# Patient Record
Sex: Female | Born: 1975 | Race: Black or African American | Hispanic: No | Smoking: Former smoker
Health system: Southern US, Community
[De-identification: ages and names within clinical notes are randomized; demographics above are authoritative.]

## PROBLEM LIST (undated history)

## (undated) DIAGNOSIS — R29898 Other symptoms and signs involving the musculoskeletal system: Secondary | ICD-10-CM

## (undated) DIAGNOSIS — J45909 Unspecified asthma, uncomplicated: Secondary | ICD-10-CM

## (undated) DIAGNOSIS — Z720 Tobacco use: Secondary | ICD-10-CM

## (undated) DIAGNOSIS — R112 Nausea with vomiting, unspecified: Secondary | ICD-10-CM

## (undated) DIAGNOSIS — F32A Depression, unspecified: Secondary | ICD-10-CM

## (undated) DIAGNOSIS — Z87442 Personal history of urinary calculi: Secondary | ICD-10-CM

## (undated) DIAGNOSIS — R7303 Prediabetes: Secondary | ICD-10-CM

## (undated) DIAGNOSIS — M5126 Other intervertebral disc displacement, lumbar region: Secondary | ICD-10-CM

## (undated) DIAGNOSIS — D649 Anemia, unspecified: Secondary | ICD-10-CM

## (undated) DIAGNOSIS — M1990 Unspecified osteoarthritis, unspecified site: Secondary | ICD-10-CM

## (undated) DIAGNOSIS — E669 Obesity, unspecified: Secondary | ICD-10-CM

## (undated) DIAGNOSIS — F329 Major depressive disorder, single episode, unspecified: Secondary | ICD-10-CM

## (undated) DIAGNOSIS — R7301 Impaired fasting glucose: Secondary | ICD-10-CM

## (undated) DIAGNOSIS — M5416 Radiculopathy, lumbar region: Secondary | ICD-10-CM

## (undated) DIAGNOSIS — Z9889 Other specified postprocedural states: Secondary | ICD-10-CM

## (undated) DIAGNOSIS — M47816 Spondylosis without myelopathy or radiculopathy, lumbar region: Secondary | ICD-10-CM

## (undated) DIAGNOSIS — F419 Anxiety disorder, unspecified: Secondary | ICD-10-CM

## (undated) HISTORY — DX: Tobacco use: Z72.0

## (undated) HISTORY — PX: INTRAUTERINE DEVICE (IUD) INSERTION: SHX5877

## (undated) HISTORY — DX: Anxiety disorder, unspecified: F41.9

## (undated) HISTORY — PX: ABDOMINAL HYSTERECTOMY: SHX81

## (undated) HISTORY — DX: Depression, unspecified: F32.A

## (undated) HISTORY — PX: BARIATRIC SURGERY: SHX1103

## (undated) HISTORY — PX: TOTAL ABDOMINAL HYSTERECTOMY: SHX209

## (undated) HISTORY — DX: Obesity, unspecified: E66.9

## (undated) HISTORY — PX: KNEE ARTHROSCOPY: SUR90

## (undated) HISTORY — PX: TUBAL LIGATION: SHX77

## (undated) HISTORY — DX: Impaired fasting glucose: R73.01

## (undated) HISTORY — DX: Major depressive disorder, single episode, unspecified: F32.9

---

## 2010-10-27 ENCOUNTER — Emergency Department: Payer: Self-pay | Admitting: Emergency Medicine

## 2010-12-25 ENCOUNTER — Emergency Department: Payer: Self-pay | Admitting: Emergency Medicine

## 2011-01-10 ENCOUNTER — Emergency Department: Payer: Self-pay | Admitting: Emergency Medicine

## 2011-03-20 LAB — HM PAP SMEAR

## 2011-04-13 ENCOUNTER — Emergency Department: Payer: Self-pay | Admitting: Emergency Medicine

## 2011-07-10 ENCOUNTER — Emergency Department: Payer: Self-pay | Admitting: *Deleted

## 2011-11-09 ENCOUNTER — Ambulatory Visit: Payer: Self-pay

## 2012-04-09 ENCOUNTER — Emergency Department: Payer: Self-pay | Admitting: *Deleted

## 2012-05-21 ENCOUNTER — Emergency Department: Payer: Self-pay | Admitting: Emergency Medicine

## 2012-05-21 LAB — URINALYSIS, COMPLETE
Bilirubin,UR: NEGATIVE
Leukocyte Esterase: NEGATIVE
Ph: 5 (ref 4.5–8.0)
Protein: NEGATIVE
RBC,UR: 1 /HPF (ref 0–5)
WBC UR: 1 /HPF (ref 0–5)

## 2012-11-01 ENCOUNTER — Emergency Department: Payer: Self-pay | Admitting: Emergency Medicine

## 2012-11-01 LAB — COMPREHENSIVE METABOLIC PANEL
Alkaline Phosphatase: 66 U/L (ref 50–136)
BUN: 7 mg/dL (ref 7–18)
Co2: 27 mmol/L (ref 21–32)
Creatinine: 0.9 mg/dL (ref 0.60–1.30)
EGFR (African American): >60
EGFR (Non-African Amer.): >60
Potassium: 3.8 mmol/L (ref 3.5–5.1)
SGOT(AST): 14 U/L — ABNORMAL LOW (ref 15–37)
Sodium: 139 mmol/L (ref 136–145)
Total Protein: 7.7 g/dL (ref 6.4–8.2)

## 2012-11-01 LAB — URINALYSIS, COMPLETE
Glucose,UR: NEGATIVE mg/dL (ref 0–75)
Ketone: NEGATIVE
Leukocyte Esterase: NEGATIVE
Nitrite: NEGATIVE
Ph: 5 (ref 4.5–8.0)
RBC,UR: 2 /HPF (ref 0–5)

## 2012-11-01 LAB — CBC
HCT: 42.1 % (ref 35.0–47.0)
Platelet: 310 10*3/uL (ref 150–440)
RBC: 4.3 10*6/uL (ref 3.80–5.20)
RDW: 13.8 % (ref 11.5–14.5)
WBC: 8.1 10*3/uL (ref 3.6–11.0)

## 2012-11-01 LAB — PREGNANCY, URINE: Pregnancy Test, Urine: NEGATIVE m[IU]/mL

## 2012-11-01 LAB — RAPID INFLUENZA A&B ANTIGENS

## 2013-05-05 ENCOUNTER — Emergency Department: Payer: Self-pay | Admitting: Emergency Medicine

## 2013-05-05 LAB — CBC
HGB: 13.1 g/dL (ref 12.0–16.0)
WBC: 14.5 10*3/uL — ABNORMAL HIGH (ref 3.6–11.0)

## 2013-05-05 LAB — BASIC METABOLIC PANEL
Anion Gap: 9 (ref 7–16)
Chloride: 107 mmol/L (ref 98–107)
Co2: 23 mmol/L (ref 21–32)
Creatinine: 1.14 mg/dL (ref 0.60–1.30)
EGFR (Non-African Amer.): >60
Glucose: 91 mg/dL (ref 65–99)
Potassium: 3.6 mmol/L (ref 3.5–5.1)
Sodium: 139 mmol/L (ref 136–145)

## 2013-06-06 ENCOUNTER — Encounter (HOSPITAL_COMMUNITY): Payer: Self-pay | Admitting: Emergency Medicine

## 2013-06-06 ENCOUNTER — Emergency Department (HOSPITAL_COMMUNITY)
Admission: EM | Admit: 2013-06-06 | Discharge: 2013-06-06 | Disposition: A | Discharge status: Home / Self Care | Payer: BC Managed Care – PPO | Attending: Emergency Medicine | Admitting: Emergency Medicine

## 2013-06-06 DIAGNOSIS — Z79899 Other long term (current) drug therapy: Secondary | ICD-10-CM | POA: Insufficient documentation

## 2013-06-06 DIAGNOSIS — Z3202 Encounter for pregnancy test, result negative: Secondary | ICD-10-CM | POA: Insufficient documentation

## 2013-06-06 DIAGNOSIS — N939 Abnormal uterine and vaginal bleeding, unspecified: Secondary | ICD-10-CM

## 2013-06-06 DIAGNOSIS — F17200 Nicotine dependence, unspecified, uncomplicated: Secondary | ICD-10-CM | POA: Insufficient documentation

## 2013-06-06 DIAGNOSIS — N898 Other specified noninflammatory disorders of vagina: Secondary | ICD-10-CM | POA: Insufficient documentation

## 2013-06-06 LAB — URINALYSIS, ROUTINE W REFLEX MICROSCOPIC
Ketones, ur: 15 mg/dL — AB
Nitrite: NEGATIVE
Specific Gravity, Urine: 1.03 (ref 1.005–1.030)
pH: 5.5 (ref 5.0–8.0)

## 2013-06-06 LAB — CBC
HCT: 37.3 % (ref 36.0–46.0)
MCH: 32.9 pg (ref 26.0–34.0)
MCV: 94.4 fL (ref 78.0–100.0)
Platelets: 337 10*3/uL (ref 150–400)
RBC: 3.95 MIL/uL (ref 3.87–5.11)
RDW: 14.4 % (ref 11.5–15.5)

## 2013-06-06 LAB — BASIC METABOLIC PANEL
BUN: 8 mg/dL (ref 6–23)
CO2: 24 mEq/L (ref 19–32)
Chloride: 103 mEq/L (ref 96–112)
GFR calc non Af Amer: 71 mL/min — ABNORMAL LOW (ref >90)
Glucose, Bld: 93 mg/dL (ref 70–99)
Potassium: 3.1 mEq/L — ABNORMAL LOW (ref 3.5–5.1)
Sodium: 137 mEq/L (ref 135–145)

## 2013-06-06 LAB — URINE MICROSCOPIC-ADD ON

## 2013-06-06 MED ORDER — KETOROLAC TROMETHAMINE 30 MG/ML IJ SOLN
30.0000 mg | Freq: Once | INTRAMUSCULAR | Status: AC
  Administered 2013-06-06: 30 mg via INTRAVENOUS
  Filled 2013-06-06: qty 1

## 2013-06-06 MED ORDER — SODIUM CHLORIDE 0.9 % IV BOLUS (SEPSIS)
1000.0000 mL | Freq: Once | INTRAVENOUS | Status: AC
  Administered 2013-06-06: 1000 mL via INTRAVENOUS

## 2013-06-06 MED ORDER — POTASSIUM CHLORIDE CRYS ER 20 MEQ PO TBCR
40.0000 meq | EXTENDED_RELEASE_TABLET | Freq: Once | ORAL | Status: AC
  Administered 2013-06-06: 40 meq via ORAL
  Filled 2013-06-06: qty 2

## 2013-06-06 NOTE — ED Notes (Signed)
Patient presents to ED today with complaints of vaginal bleeding and cramping, pain in her lower back  that started today, and patient reports her IUD came out, patient has IUD with her in cup. Patient continues to bleed at this time, patient reports 2 pads per hour.

## 2013-06-06 NOTE — ED Provider Notes (Signed)
CSN: [id number]     Arrival date & time 06/06/13  1531 History     First MD Initiated Contact with Patient 06/06/13 1628     Chief Complaint  Patient presents with  . Vaginal Bleeding   (Consider location/radiation/quality/duration/timing/severity/associated sxs/prior Treatment) The history is provided by the patient.  Haley Garza is a 37 y.o. female your vaginal bleeding. She had a Maranda daily place by her OB in February. Today she has a lot of vaginal bleeding and cramping and she noticed that her Hinda Kehr came out. She then had more vaginal bleeding afterwards. Denies any lightheaded and dizziness. Denies being pregnant.    History reviewed. No pertinent past medical history. Past Surgical History  Procedure Laterality Date  . Intrauterine device (iud) insertion      feb 2014  . Cesarean section      x 2    History reviewed. No pertinent family history. History  Substance Use Topics  . Smoking status: Current Every Day Smoker    Types: Cigarettes  . Smokeless tobacco: Not on file  . Alcohol Use: Not on file   OB History   Grav Para Term Preterm Abortions TAB SAB Ect Mult Living                 Review of Systems  Genitourinary: Positive for vaginal bleeding.  All other systems reviewed and are negative.    Allergies  Review of patient's allergies indicates no known allergies.  Home Medications   Current Outpatient Rx  Name  Route  Sig  Dispense  Refill  . buPROPion (WELLBUTRIN SR) 150 MG 12 hr tablet   Oral   Take 150 mg by mouth 2 (two) times daily.         Marland Kitchen ibuprofen (ADVIL,MOTRIN) 200 MG tablet   Oral   Take 800 mg by mouth daily as needed for pain.         . Multiple Vitamins-Minerals (MULTIVITAMIN PO)   Oral   Take 1 tablet by mouth daily.         . Vitamin D, Ergocalciferol, (DRISDOL) 50000 UNITS CAPS capsule   Oral   Take 50,000 Units by mouth 2 (two) times a week.          BP 116/74  Pulse 52  Temp(Src) 98.3 F (36.8 C)  (Oral)  Resp 20  SpO2 100% Physical Exam  Nursing note and vitals reviewed. Constitutional: She is oriented to person, place, and time. She appears well-developed and well-nourished.  Slightly anxious   HENT:  Head: Normocephalic.  Mouth/Throat: Oropharynx is clear and moist.  Eyes: Conjunctivae are normal. Pupils are equal, round, and reactive to light.  Neck: Normal range of motion. Neck supple.  Cardiovascular: Normal rate, regular rhythm and normal heart sounds.   Pulmonary/Chest: Effort normal and breath sounds normal. No respiratory distress. She has no wheezes. She has no rales.  Abdominal: Soft. Bowel sounds are normal.  Obese, nontender and nondistended   Genitourinary:  Minimal blood at os, small amount of blood. Os closed, no CMT, no adnexal tenderness.   Musculoskeletal: Normal range of motion. She exhibits no edema and no tenderness.  Neurological: She is alert and oriented to person, place, and time.  Skin: Skin is warm and dry.  Psychiatric: She has a normal mood and affect. Her behavior is normal. Judgment and thought content normal.    ED Course   Procedures (including critical care time)  Labs Reviewed  URINALYSIS, ROUTINE W REFLEX MICROSCOPIC -  Abnormal; Notable for the following:    Color, Urine RED (*)    APPearance CLOUDY (*)    Hgb urine dipstick LARGE (*)    Bilirubin Urine SMALL (*)    Ketones, ur 15 (*)    Protein, ur 30 (*)    Leukocytes, UA SMALL (*)    All other components within normal limits  BASIC METABOLIC PANEL - Abnormal; Notable for the following:    Potassium 3.1 (*)    GFR calc non Af Amer 71 (*)    GFR calc Af Amer 83 (*)    All other components within normal limits  URINE MICROSCOPIC-ADD ON - Abnormal; Notable for the following:    Squamous Epithelial / LPF FEW (*)    All other components within normal limits  CBC  PREGNANCY, URINE   No results found. No diagnosis found.  MDM  Haley Garza is a 37 y.o. female here with  vaginal bleeding. I was able to visualize the miranda and it appeared intact. Her cbc is nl, K 3.1, supplemented. UCG neg, UA contaminated. I told her that its not uncommon to have miranda come out with vaginal bleeding and that she is expected to have more bleeding in the next few days. She will f/u with her OB regarding other options for vaginal cramps and bleeding.    Richardean Canal, MD 06/06/13 [phone]

## 2013-08-05 ENCOUNTER — Ambulatory Visit: Payer: Self-pay | Admitting: Orthopedic Surgery

## 2013-09-18 ENCOUNTER — Ambulatory Visit: Payer: Self-pay | Admitting: Unknown Physician Specialty

## 2014-01-14 ENCOUNTER — Emergency Department: Payer: Self-pay | Admitting: Emergency Medicine

## 2014-01-14 LAB — URINALYSIS, COMPLETE
Bacteria: NONE SEEN
Bilirubin,UR: NEGATIVE
GLUCOSE, UR: NEGATIVE mg/dL (ref 0–75)
KETONE: NEGATIVE
LEUKOCYTE ESTERASE: NEGATIVE
NITRITE: NEGATIVE
Ph: 5 (ref 4.5–8.0)
Protein: NEGATIVE
RBC,UR: 2 /HPF (ref 0–5)
SPECIFIC GRAVITY: 1.02 (ref 1.003–1.030)
Squamous Epithelial: <1

## 2014-01-14 LAB — CBC WITH DIFFERENTIAL/PLATELET
BASOS ABS: 0.1 10*3/uL (ref 0.0–0.1)
Basophil %: 1.1 %
EOS PCT: 4.7 %
Eosinophil #: 0.5 10*3/uL (ref 0.0–0.7)
HCT: 41.5 % (ref 35.0–47.0)
HGB: 13.6 g/dL (ref 12.0–16.0)
LYMPHS ABS: 3.1 10*3/uL (ref 1.0–3.6)
Lymphocyte %: 28.8 %
MCH: 31.7 pg (ref 26.0–34.0)
MCHC: 32.8 g/dL (ref 32.0–36.0)
MCV: 97 fL (ref 80–100)
Monocyte #: 0.7 x10 3/mm (ref 0.2–0.9)
Monocyte %: 6.3 %
NEUTROS ABS: 6.3 10*3/uL (ref 1.4–6.5)
Neutrophil %: 59.1 %
PLATELETS: 278 10*3/uL (ref 150–440)
RBC: 4.29 10*6/uL (ref 3.80–5.20)
RDW: 14.9 % — ABNORMAL HIGH (ref 11.5–14.5)
WBC: 10.7 10*3/uL (ref 3.6–11.0)

## 2014-01-14 LAB — COMPREHENSIVE METABOLIC PANEL
ALBUMIN: 3.4 g/dL (ref 3.4–5.0)
ALK PHOS: 51 U/L
ALT: 20 U/L (ref 12–78)
ANION GAP: 4 — AB (ref 7–16)
AST: 16 U/L (ref 15–37)
BUN: 10 mg/dL (ref 7–18)
Bilirubin,Total: 0.4 mg/dL (ref 0.2–1.0)
CO2: 27 mmol/L (ref 21–32)
CREATININE: 0.9 mg/dL (ref 0.60–1.30)
Calcium, Total: 8.7 mg/dL (ref 8.5–10.1)
Chloride: 107 mmol/L (ref 98–107)
EGFR (African American): >60
GLUCOSE: 127 mg/dL — AB (ref 65–99)
OSMOLALITY: 276 (ref 275–301)
POTASSIUM: 3.5 mmol/L (ref 3.5–5.1)
Sodium: 138 mmol/L (ref 136–145)
TOTAL PROTEIN: 7.4 g/dL (ref 6.4–8.2)

## 2014-01-14 LAB — TROPONIN I: Troponin-I: <0.02 ng/mL

## 2014-01-14 LAB — LIPASE, BLOOD: LIPASE: 161 U/L (ref 73–393)

## 2014-01-15 LAB — TSH: THYROID STIMULATING HORM: 1.29 u[IU]/mL

## 2015-01-18 ENCOUNTER — Ambulatory Visit: Payer: Self-pay | Admitting: Specialist

## 2015-01-26 DIAGNOSIS — R0789 Other chest pain: Secondary | ICD-10-CM | POA: Insufficient documentation

## 2015-01-28 ENCOUNTER — Ambulatory Visit: Admit: 2015-01-28 | Disposition: A | Discharge status: Home / Self Care | Payer: Self-pay | Admitting: Specialist

## 2015-04-08 ENCOUNTER — Other Ambulatory Visit: Payer: Self-pay | Admitting: Unknown Physician Specialty

## 2015-04-11 NOTE — Telephone Encounter (Signed)
Needs seen for further refills

## 2015-04-30 ENCOUNTER — Emergency Department: Payer: BC Managed Care – PPO

## 2015-04-30 ENCOUNTER — Encounter: Payer: Self-pay | Admitting: Emergency Medicine

## 2015-04-30 ENCOUNTER — Emergency Department
Admission: EM | Admit: 2015-04-30 | Discharge: 2015-04-30 | Disposition: A | Discharge status: Home / Self Care | Payer: BC Managed Care – PPO | Attending: Emergency Medicine | Admitting: Emergency Medicine

## 2015-04-30 DIAGNOSIS — Z79899 Other long term (current) drug therapy: Secondary | ICD-10-CM | POA: Diagnosis not present

## 2015-04-30 DIAGNOSIS — M7731 Calcaneal spur, right foot: Secondary | ICD-10-CM | POA: Diagnosis not present

## 2015-04-30 DIAGNOSIS — M79671 Pain in right foot: Secondary | ICD-10-CM | POA: Diagnosis present

## 2015-04-30 DIAGNOSIS — Z87891 Personal history of nicotine dependence: Secondary | ICD-10-CM | POA: Insufficient documentation

## 2015-04-30 MED ORDER — NAPROXEN 500 MG PO TABS: 500.0000 mg | ORAL_TABLET | Freq: Two times a day (BID) | ORAL | Status: DC

## 2015-04-30 MED ORDER — TRAMADOL HCL 50 MG PO TABS: 50.0000 mg | ORAL_TABLET | Freq: Four times a day (QID) | ORAL | Status: DC | PRN

## 2015-04-30 NOTE — Discharge Instructions (Signed)
Advise OTC donut heel support pending Podiatry evaluation. Heel Spur A heel spur is a hook of bone that can form on the calcaneus (the heel bone and the largest bone of the foot). Heel spurs are often associated with plantar fasciitis and usually come in people who have had the problem for an extended period of time. The cause of the relationship is unknown. The pain associated with them is thought to be caused by an inflammation (soreness and redness) of the plantar fascia rather than the spur itself. The plantar fascia is a thick fibrous like tissue that runs from the calcaneus (heel bone) to the ball of the foot. This strong, tight tissue helps maintain the arch of your foot. It helps distribute the weight across your foot as you walk or run. Stresses placed on the plantar fascia can be tremendous. When it is inflamed normal activities become painful. Pain is worse in the morning after sleeping. After sleeping the plantar fascia is tight. The first movements stretch the fascia and this causes pain. As the tendon loosens, the pain usually gets better. It often returns with too much standing or walking.  About 70% of patients with plantar fasciitis have a heel spur. About half of people without foot pain also have heel spurs. DIAGNOSIS  The diagnosis of a heel spur is made by X-ray. The X-ray shows a hook of bone protruding from the bottom of the calcaneus at the point where the plantar fascia is attached to the heel bone.  TREATMENT  It is necessary to find out what is causing the stretching of the plantar fascia. If the cause is over-pronation (flat feet), orthotics and proper foot ware may help.  Stretching exercises, losing weight, wearing shoes that have a cushioned heel that absorbs shock, and elevating the heel with the use of a heel cradle, heel cup, or orthotics may all help. Heel cradles and heel cups provide extra comfort and cushion to the heel, and reduce the amount of shock to the sore  area. AVOIDING THE PAIN OF PLANTAR FASCIITIS AND HEEL SPURS  Consult a sports medicine professional before beginning a new exercise program.  Walking programs offer a good workout. There is a lower chance of overuse injuries common to the runners. There is less impact and less jarring of the joints.  Begin all new exercise programs slowly. If problems or pains develop, decrease the amount of time or distance until you are at a comfortable level.  Wear good shoes and replace them regularly.  Stretch your foot and the heel cords at the back of the ankle (Achilles tendons) both before and after exercise.  Run or exercise on even surfaces that are not hard. For example, asphalt is better than pavement.  Do not run barefoot on hard surfaces.  If using a treadmill, vary the incline.  Do not continue to workout if you have foot or joint problems. Seek professional help if they do not improve. HOME CARE INSTRUCTIONS   Avoid activities that cause you pain until you recover.  Use ice or cold packs to the problem or painful areas after working out.  Only take over-the-counter or prescription medicines for pain, discomfort, or fever as directed by your caregiver.  Soft shoe inserts or athletic shoes with air or gel sole cushions may be helpful.  If problems continue or become more severe, consult a sports medicine caregiver. Cortisone is a potent anti-inflammatory medication that may be injected into the painful area. You can discuss this  treatment with your caregiver. MAKE SURE YOU:   Understand these instructions.  Will watch your condition.  Will get help right away if you are not doing well or get worse. Document Released: 11/21/2005 Document Revised: 01/07/2012 Document Reviewed: 12/16/2013 [facility] Patient Information 2015 [place], [place]. This information is not intended to replace advice given to you by your health care provider. Make sure you discuss any questions you have with  your health care provider.

## 2015-04-30 NOTE — ED Provider Notes (Signed)
[facility] Emergency Department Provider Note  ____________________________________________  Time seen: Approximately 5:16 PM  I have reviewed the triage vital signs and the nursing notes.   HISTORY  Chief Complaint Foot Pain    HPI Haley Garza is a 39 y.o. female patient complaining or right foot pain for 2 weeks. Patient states pain increase today after a walk incident last night. Patient stated pain is in the bottom of her heel. States pain increases with walking. Patient is rating the pain is 8/10. Patient stating the past 2 weeks she been able use an over-the-counter anti-inflammatory is but today and it has not helped. Patient Denies any loss of sensation or loss of strength.   History reviewed. No pertinent past medical history.  There are no active problems to display for this patient.   Past Surgical History  Procedure Laterality Date  . Intrauterine device (iud) insertion      feb 2014  . Cesarean section      x 2   . Knee arthroscopy      Current Outpatient Rx  Name  Route  Sig  Dispense  Refill  . buPROPion (WELLBUTRIN SR) 150 MG 12 hr tablet      take 1 tablet by mouth twice a day   60 tablet   0   . ibuprofen (ADVIL,MOTRIN) 200 MG tablet   Oral   Take 800 mg by mouth daily as needed for pain.         . Multiple Vitamins-Minerals (MULTIVITAMIN PO)   Oral   Take 1 tablet by mouth daily.         . naproxen (NAPROSYN) 500 MG tablet   Oral   Take 1 tablet (500 mg total) by mouth 2 (two) times daily with a meal.   20 tablet   0   . traMADol (ULTRAM) 50 MG tablet   Oral   Take 1 tablet (50 mg total) by mouth every 6 (six) hours as needed for moderate pain.   12 tablet   0   . Vitamin D, Ergocalciferol, (DRISDOL) 50000 UNITS CAPS capsule   Oral   Take 50,000 Units by mouth 2 (two) times a week.           Allergies Review of patient's allergies indicates no known allergies.  No family history on  file.  Social History History  Substance Use Topics  . Smoking status: Former Research scientist (life sciences)  . Smokeless tobacco: Not on file  . Alcohol Use: No    Review of Systems Constitutional: No fever/chills Eyes: No visual changes. ENT: No sore throat. Cardiovascular: Denies chest pain. Respiratory: Denies shortness of breath. Gastrointestinal: No abdominal pain.  No nausea, no vomiting.  No diarrhea.  No constipation. Genitourinary: Negative for dysuria. Musculoskeletal: Negative for back pain. Skin: Negative for rash. Neurological: Negative for headaches, focal weakness or numbness. 10-point ROS otherwise negative.  ____________________________________________   PHYSICAL EXAM:  VITAL SIGNS: ED Triage Vitals  Enc Vitals Group     BP 04/30/15 1635 128/65 mmHg     Pulse Rate 04/30/15 1635 80     Resp 04/30/15 1635 20     Temp 04/30/15 1635 98.6 F (37 C)     Temp Source 04/30/15 1635 Oral     SpO2 04/30/15 1635 97 %     Weight 04/30/15 1635 295 lb (133.811 kg)     Height 04/30/15 1635 5\' 7"  (1.702 m)     Head Cir --  Peak Flow --      Pain Score 04/30/15 1636 8     Pain Loc --      Pain Edu? --      Excl. in [place]? --     Constitutional: Alert and oriented. Well appearing and in no acute distress. Eyes: Conjunctivae are normal. PERRL. EOMI. Head: Atraumatic. Nose: No congestion/rhinnorhea. Mouth/Throat: Mucous membranes are moist.  Oropharynx non-erythematous. Neck: No stridor. No cervical spine tenderness to palpation. Hematological/Lymphatic/Immunilogical: No cervical lymphadenopathy. Cardiovascular: Normal rate, regular rhythm. Grossly normal heart sounds.  Good peripheral circulation. Respiratory: Normal respiratory effort.  No retractions. Lungs CTAB. Gastrointestinal: Soft and nontender. No distention. No abdominal bruits. No CVA tenderness. Musculoskeletal: Patient has very little arch in her feet bilaterally. No other obvious deformity. There is no edema. Patient is  tender palpation plan aspect of the calcaneus.  Neurologic:  Normal speech and language. No gross focal neurologic deficits are appreciated. Speech is normal. No gait instability. Skin:  Skin is warm, dry and intact. No rash noted. Psychiatric: Mood and affect are normal. Speech and behavior are normal.  ____________________________________________   LABS (all labs ordered are listed, but only abnormal results are displayed)  Labs Reviewed - No data to display ____________________________________________  EKG   ____________________________________________  RADIOLOGY  Findings consistent with small heel spur. I, Sable Feil, personally viewed and evaluated these images as part of my medical decision making.   ____________________________________________   PROCEDURES  Procedure(s) performed: None  Critical Care performed: No  ____________________________________________   INITIAL IMPRESSION / ASSESSMENT AND PLAN / ED COURSE  Pertinent labs & imaging results that were available during my care of the patient were reviewed by me and considered in my medical decision making (see chart for details).  Right foot pain. Last patient's over-the-counter heel spur supports pending evaluation by podiatry. Patient prescribed naproxen and tramadol. Last 10 year condition worsens. ____________________________________________   FINAL CLINICAL IMPRESSION(S) / ED DIAGNOSES  Final diagnoses:  Heel spur, right      Sable Feil, PA-C 04/30/15 1813  Orbie Pyo, MD 05/01/15 1046

## 2015-04-30 NOTE — ED Notes (Signed)
Denies injury, hurts when step down on foot

## 2015-05-12 ENCOUNTER — Other Ambulatory Visit: Payer: Self-pay | Admitting: Unknown Physician Specialty

## 2015-08-12 ENCOUNTER — Other Ambulatory Visit: Payer: Self-pay | Admitting: Specialist

## 2015-08-12 DIAGNOSIS — R1011 Right upper quadrant pain: Secondary | ICD-10-CM

## 2015-08-13 ENCOUNTER — Encounter: Payer: Self-pay | Admitting: Emergency Medicine

## 2015-08-13 ENCOUNTER — Emergency Department: Payer: BC Managed Care – PPO

## 2015-08-13 ENCOUNTER — Emergency Department
Admission: EM | Admit: 2015-08-13 | Discharge: 2015-08-13 | Disposition: A | Discharge status: Home / Self Care | Payer: BC Managed Care – PPO | Attending: Emergency Medicine | Admitting: Emergency Medicine

## 2015-08-13 DIAGNOSIS — K2970 Gastritis, unspecified, without bleeding: Secondary | ICD-10-CM | POA: Diagnosis not present

## 2015-08-13 DIAGNOSIS — Z720 Tobacco use: Secondary | ICD-10-CM | POA: Diagnosis not present

## 2015-08-13 DIAGNOSIS — Z3202 Encounter for pregnancy test, result negative: Secondary | ICD-10-CM | POA: Insufficient documentation

## 2015-08-13 DIAGNOSIS — R1011 Right upper quadrant pain: Secondary | ICD-10-CM

## 2015-08-13 DIAGNOSIS — Z791 Long term (current) use of non-steroidal anti-inflammatories (NSAID): Secondary | ICD-10-CM | POA: Insufficient documentation

## 2015-08-13 DIAGNOSIS — Z79899 Other long term (current) drug therapy: Secondary | ICD-10-CM | POA: Insufficient documentation

## 2015-08-13 LAB — URINALYSIS COMPLETE WITH MICROSCOPIC (ARMC ONLY)
Glucose, UA: NEGATIVE mg/dL
HGB URINE DIPSTICK: NEGATIVE
LEUKOCYTES UA: NEGATIVE
Nitrite: NEGATIVE
PH: 5 (ref 5.0–8.0)
PROTEIN: 30 mg/dL — AB
Specific Gravity, Urine: 1.032 — ABNORMAL HIGH (ref 1.005–1.030)

## 2015-08-13 LAB — COMPREHENSIVE METABOLIC PANEL
ALBUMIN: 3.8 g/dL (ref 3.5–5.0)
ALT: 27 U/L (ref 14–54)
ANION GAP: 10 (ref 5–15)
AST: 28 U/L (ref 15–41)
Alkaline Phosphatase: 49 U/L (ref 38–126)
BUN: 8 mg/dL (ref 6–20)
CHLORIDE: 109 mmol/L (ref 101–111)
CO2: 20 mmol/L — ABNORMAL LOW (ref 22–32)
Calcium: 8.2 mg/dL — ABNORMAL LOW (ref 8.9–10.3)
Creatinine, Ser: 0.7 mg/dL (ref 0.44–1.00)
GFR calc Af Amer: >60 mL/min (ref >60)
GLUCOSE: 79 mg/dL (ref 65–99)
POTASSIUM: 3.8 mmol/L (ref 3.5–5.1)
Sodium: 139 mmol/L (ref 135–145)
TOTAL PROTEIN: 6.8 g/dL (ref 6.5–8.1)
Total Bilirubin: 1.3 mg/dL — ABNORMAL HIGH (ref 0.3–1.2)

## 2015-08-13 LAB — CBC
HCT: 38.7 % (ref 35.0–47.0)
Hemoglobin: 13 g/dL (ref 12.0–16.0)
MCH: 32.1 pg (ref 26.0–34.0)
MCHC: 33.6 g/dL (ref 32.0–36.0)
MCV: 95.3 fL (ref 80.0–100.0)
PLATELETS: 262 10*3/uL (ref 150–440)
RBC: 4.06 MIL/uL (ref 3.80–5.20)
RDW: 16.3 % — ABNORMAL HIGH (ref 11.5–14.5)
WBC: 7.2 10*3/uL (ref 3.6–11.0)

## 2015-08-13 LAB — LIPASE, BLOOD: LIPASE: 23 U/L (ref 22–51)

## 2015-08-13 LAB — POCT PREGNANCY, URINE: PREG TEST UR: NEGATIVE

## 2015-08-13 MED ORDER — PANTOPRAZOLE SODIUM 40 MG PO TBEC: 40.0000 mg | DELAYED_RELEASE_TABLET | Freq: Every day | ORAL | Status: DC

## 2015-08-13 MED ORDER — ONDANSETRON 4 MG PO TBDP
4.0000 mg | ORAL_TABLET | Freq: Once | ORAL | Status: AC
  Administered 2015-08-13: 4 mg via ORAL
  Filled 2015-08-13: qty 1

## 2015-08-13 MED ORDER — GI COCKTAIL ~~LOC~~
30.0000 mL | Freq: Once | ORAL | Status: AC
  Administered 2015-08-13: 30 mL via ORAL
  Filled 2015-08-13: qty 30

## 2015-08-13 MED ORDER — SUCRALFATE 1 G PO TABS: 1.0000 g | ORAL_TABLET | Freq: Four times a day (QID) | ORAL | Status: DC

## 2015-08-13 MED ORDER — ONDANSETRON HCL 4 MG/2ML IJ SOLN
4.0000 mg | Freq: Once | INTRAMUSCULAR | Status: DC | PRN
Start: 2015-08-13 — End: 2015-08-13
  Filled 2015-08-13: qty 2

## 2015-08-13 NOTE — ED Provider Notes (Signed)
[facility] Emergency Department Provider Note    ____________________________________________  Time seen: 1150  I have reviewed the triage vital signs and the nursing notes.   HISTORY  Chief Complaint Abdominal Pain   History limited by: Not Limited   HPI Haley Garza is a 39 y.o. female who presents to the emergency department today with concerns for right upper quadrant abdominal pain. She states she's been having this pain for the past 3 days. She states that the pain is intermittent. She states that the pain will come when she eats. It will then last a little while before dissipating. She has had some associated nausea. She denies any radiation of the pain. She denies any bloody stool. Denies any fevers. States that her mother had gallstones.     History reviewed. No pertinent past medical history.  There are no active problems to display for this patient.   Past Surgical History  Procedure Laterality Date  . Intrauterine device (iud) insertion      feb 2014  . Cesarean section      x 2   . Knee arthroscopy    . Bariatric surgery      Current Outpatient Rx  Name  Route  Sig  Dispense  Refill  . ferrous sulfate 325 (65 FE) MG tablet   Oral   Take 65 mg by mouth daily with breakfast.         . Multiple Vitamins-Minerals (MULTIVITAMIN PO)   Oral   Take 1 tablet by mouth daily.         . phytonadione (VITAMIN K) 5 MG tablet   Oral   Take 60 mg by mouth once.         . vitamin A 10000 UNIT capsule   Oral   Take 5,000 Units by mouth daily.         . vitamin B-12 (CYANOCOBALAMIN) 100 MCG tablet   Oral   Take 100 mcg by mouth daily.         . Vitamin D, Ergocalciferol, (DRISDOL) 50000 UNITS CAPS capsule   Oral   Take 2,000 Units by mouth 2 (two) times a week.          . vitamin E 100 UNIT capsule   Oral   Take 250 Units by mouth daily.         Marland Kitchen buPROPion (WELLBUTRIN SR) 150 MG 12 hr tablet      take 1  tablet by mouth twice a day   60 tablet   0   . ibuprofen (ADVIL,MOTRIN) 200 MG tablet   Oral   Take 800 mg by mouth daily as needed for pain.         . naproxen (NAPROSYN) 500 MG tablet   Oral   Take 1 tablet (500 mg total) by mouth 2 (two) times daily with a meal.   20 tablet   0   . traMADol (ULTRAM) 50 MG tablet   Oral   Take 1 tablet (50 mg total) by mouth every 6 (six) hours as needed for moderate pain.   12 tablet   0     Allergies Review of patient's allergies indicates no known allergies.  No family history on file.  Social History Social History  Substance Use Topics  . Smoking status: Current Some Day Smoker  . Smokeless tobacco: None  . Alcohol Use: No    Review of Systems  Constitutional: Negative for fever. Cardiovascular: Negative for chest pain. Respiratory:  Negative for shortness of breath. Gastrointestinal: Positive for right upper quadrant pain Genitourinary: Negative for dysuria. Musculoskeletal: Negative for back pain. Skin: Negative for rash. Neurological: Negative for headaches, focal weakness or numbness.   10-point ROS otherwise negative.  ____________________________________________   PHYSICAL EXAM:  VITAL SIGNS: ED Triage Vitals  Enc Vitals Group     BP 08/13/15 1104 133/59 mmHg     Pulse Rate 08/13/15 1104 57     Resp 08/13/15 1104 16     Temp 08/13/15 1104 98.6 F (37 C)     Temp Source 08/13/15 1104 Oral     SpO2 08/13/15 1104 98 %     Weight 08/13/15 1104 239 lb (108.41 kg)     Height 08/13/15 1104 5\' 7"  (1.702 m)   Constitutional: Alert and oriented. Well appearing and in no distress. Eyes: Conjunctivae are normal. PERRL. Normal extraocular movements. ENT   Head: Normocephalic and atraumatic.   Nose: No congestion/rhinnorhea.   Mouth/Throat: Mucous membranes are moist.   Neck: No stridor. Hematological/Lymphatic/Immunilogical: No cervical lymphadenopathy. Cardiovascular: Normal rate, regular  rhythm.  No murmurs, rubs, or gallops. Respiratory: Normal respiratory effort without tachypnea nor retractions. Breath sounds are clear and equal bilaterally. No wheezes/rales/rhonchi. Gastrointestinal: Soft. Tender to palpation in the right upper quadrant and epigastric region. Positive Murphy sign. Genitourinary: Deferred Musculoskeletal: Normal range of motion in all extremities. No joint effusions.  No lower extremity tenderness nor edema. Neurologic:  Normal speech and language. No gross focal neurologic deficits are appreciated. Speech is normal.  Skin:  Skin is warm, dry and intact. No rash noted. Psychiatric: Mood and affect are normal. Speech and behavior are normal. Patient exhibits appropriate insight and judgment.  ____________________________________________    LABS (pertinent positives/negatives)  Labs Reviewed  URINALYSIS COMPLETEWITH MICROSCOPIC ([facility] ONLY) - Abnormal; Notable for the following:    Color, Urine AMBER (*)    APPearance CLEAR (*)    Bilirubin Urine 1+ (*)    Ketones, ur 2+ (*)    Specific Gravity, Urine 1.032 (*)    Protein, ur 30 (*)    Bacteria, UA RARE (*)    Squamous Epithelial / LPF 0-5 (*)    All other components within normal limits  COMPREHENSIVE METABOLIC PANEL - Abnormal; Notable for the following:    CO2 20 (*)    Calcium 8.2 (*)    Total Bilirubin 1.3 (*)    All other components within normal limits  CBC - Abnormal; Notable for the following:    RDW 16.3 (*)    All other components within normal limits  LIPASE, BLOOD  POC URINE PREG, ED  POCT PREGNANCY, URINE     ____________________________________________   EKG  I, Nance Pear, attending physician, personally viewed and interpreted this EKG  EKG Time: 1138 Rate: 53 Rhythm: sinus bradycardia Axis: normal Intervals: qtc 415 QRS: narrow ST changes: no st elevation Impression: sinus bradycardia, otherwise normal ekg ____________________________________________     RADIOLOGY  Right upper quadrant ultrasound  IMPRESSION: No explanation for patient's persistent right upper quadrant abdominal pain. Specifically, no evidence of cholelithiasis / cholecystitis.   ____________________________________________   PROCEDURES  Procedure(s) performed: None  Critical Care performed: No  ____________________________________________   INITIAL IMPRESSION / ASSESSMENT AND PLAN / ED COURSE  Pertinent labs & imaging results that were available during my care of the patient were reviewed by me and considered in my medical decision making (see chart for details).  Patient presents to the emergency department today because of concerns for  abdominal pain after eating. Right upper ultrasound was performed to rule out gallbladder disease and this was normal. Patient was given a GI cocktail which patient stated did help resolve her symptoms. She was able to drink water afterwards without any abdominal pain. Because of this I think gastritis likely. Will discharge home with antacid as well as sucralfate.  ____________________________________________   FINAL CLINICAL IMPRESSION(S) / ED DIAGNOSES  Final diagnoses:  RUQ pain  Gastritis     Nance Pear, MD 08/13/15 1515

## 2015-08-13 NOTE — Discharge Instructions (Signed)
Please seek medical attention for any high fevers, chest pain, shortness of breath, change in behavior, persistent vomiting, bloody stool or any other new or concerning symptoms.   Gastritis, Adult Gastritis is soreness and swelling (inflammation) of the lining of the stomach. Gastritis can develop as a sudden onset (acute) or long-term (chronic) condition. If gastritis is not treated, it can lead to stomach bleeding and ulcers. CAUSES  Gastritis occurs when the stomach lining is weak or damaged. Digestive juices from the stomach then inflame the weakened stomach lining. The stomach lining may be weak or damaged due to viral or bacterial infections. One common bacterial infection is the Helicobacter pylori infection. Gastritis can also result from excessive alcohol consumption, taking certain medicines, or having too much acid in the stomach.  SYMPTOMS  In some cases, there are no symptoms. When symptoms are present, they may include:  Pain or a burning sensation in the upper abdomen.  Nausea.  Vomiting.  An uncomfortable feeling of fullness after eating. DIAGNOSIS  Your caregiver may suspect you have gastritis based on your symptoms and a physical exam. To determine the cause of your gastritis, your caregiver may perform the following:  Blood or stool tests to check for the H pylori bacterium.  Gastroscopy. A thin, flexible tube (endoscope) is passed down the esophagus and into the stomach. The endoscope has a light and camera on the end. Your caregiver uses the endoscope to view the inside of the stomach.  Taking a tissue sample (biopsy) from the stomach to examine under a microscope. TREATMENT  Depending on the cause of your gastritis, medicines may be prescribed. If you have a bacterial infection, such as an H pylori infection, antibiotics may be given. If your gastritis is caused by too much acid in the stomach, H2 blockers or antacids may be given. Your caregiver may recommend that  you stop taking aspirin, ibuprofen, or other nonsteroidal anti-inflammatory drugs (NSAIDs). HOME CARE INSTRUCTIONS  Only take over-the-counter or prescription medicines as directed by your caregiver.  If you were given antibiotic medicines, take them as directed. Finish them even if you start to feel better.  Drink enough fluids to keep your urine clear or pale yellow.  Avoid foods and drinks that make your symptoms worse, such as:  Caffeine or alcoholic drinks.  Chocolate.  Peppermint or mint flavorings.  Garlic and onions.  Spicy foods.  Citrus fruits, such as oranges, lemons, or limes.  Tomato-based foods such as sauce, chili, salsa, and pizza.  Fried and fatty foods.  Eat small, frequent meals instead of large meals. SEEK IMMEDIATE MEDICAL CARE IF:   You have black or dark red stools.  You vomit blood or material that looks like coffee grounds.  You are unable to keep fluids down.  Your abdominal pain gets worse.  You have a fever.  You do not feel better after 1 week.  You have any other questions or concerns. MAKE SURE YOU:  Understand these instructions.  Will watch your condition.  Will get help right away if you are not doing well or get worse.   This information is not intended to replace advice given to you by your health care provider. Make sure you discuss any questions you have with your health care provider.   Document Released: 10/09/2001 Document Revised: 04/15/2012 Document Reviewed: 11/28/2011 Elsevier Interactive Patient Education [employer].

## 2015-08-13 NOTE — ED Notes (Signed)
IV access attempted x1 by this RN, x2 by Medic unsuccessfully. Dr. Archie Balboa aware. Order for Zofran changed to ODT per protocol.

## 2015-08-13 NOTE — ED Notes (Signed)
Patient to ER with pain to RUQ abdomen, particularly after eating and drinking. Patient has h/o bariatric surgery.

## 2015-09-14 DIAGNOSIS — F32A Depression, unspecified: Secondary | ICD-10-CM | POA: Insufficient documentation

## 2015-09-14 DIAGNOSIS — R7301 Impaired fasting glucose: Secondary | ICD-10-CM

## 2015-09-14 DIAGNOSIS — R001 Bradycardia, unspecified: Secondary | ICD-10-CM | POA: Insufficient documentation

## 2015-09-14 DIAGNOSIS — J45902 Unspecified asthma with status asthmaticus: Secondary | ICD-10-CM

## 2015-09-14 DIAGNOSIS — J45909 Unspecified asthma, uncomplicated: Secondary | ICD-10-CM | POA: Insufficient documentation

## 2015-09-14 DIAGNOSIS — Z720 Tobacco use: Secondary | ICD-10-CM | POA: Insufficient documentation

## 2015-09-14 DIAGNOSIS — F419 Anxiety disorder, unspecified: Secondary | ICD-10-CM

## 2015-09-14 DIAGNOSIS — Z9884 Bariatric surgery status: Secondary | ICD-10-CM | POA: Insufficient documentation

## 2015-09-14 DIAGNOSIS — F329 Major depressive disorder, single episode, unspecified: Secondary | ICD-10-CM | POA: Insufficient documentation

## 2015-09-21 ENCOUNTER — Ambulatory Visit: Payer: Self-pay | Admitting: Unknown Physician Specialty

## 2015-12-06 ENCOUNTER — Encounter: Payer: Self-pay | Admitting: Emergency Medicine

## 2015-12-06 ENCOUNTER — Emergency Department
Admission: EM | Admit: 2015-12-06 | Discharge: 2015-12-06 | Disposition: A | Discharge status: Home / Self Care | Payer: BC Managed Care – PPO | Attending: Emergency Medicine | Admitting: Emergency Medicine

## 2015-12-06 DIAGNOSIS — F17200 Nicotine dependence, unspecified, uncomplicated: Secondary | ICD-10-CM | POA: Diagnosis not present

## 2015-12-06 DIAGNOSIS — J069 Acute upper respiratory infection, unspecified: Secondary | ICD-10-CM

## 2015-12-06 DIAGNOSIS — Z79899 Other long term (current) drug therapy: Secondary | ICD-10-CM | POA: Diagnosis not present

## 2015-12-06 DIAGNOSIS — Z791 Long term (current) use of non-steroidal anti-inflammatories (NSAID): Secondary | ICD-10-CM | POA: Diagnosis not present

## 2015-12-06 DIAGNOSIS — J45909 Unspecified asthma, uncomplicated: Secondary | ICD-10-CM | POA: Diagnosis not present

## 2015-12-06 DIAGNOSIS — R05 Cough: Secondary | ICD-10-CM | POA: Diagnosis present

## 2015-12-06 MED ORDER — GUAIFENESIN-CODEINE 100-10 MG/5ML PO SOLN: 10.0000 mL | ORAL | Status: DC | PRN

## 2015-12-06 MED ORDER — IBUPROFEN 800 MG PO TABS: 800.0000 mg | ORAL_TABLET | Freq: Three times a day (TID) | ORAL | Status: DC | PRN

## 2015-12-06 MED ORDER — AZITHROMYCIN 250 MG PO TABS: ORAL_TABLET | ORAL | Status: DC

## 2015-12-06 NOTE — Discharge Instructions (Signed)
Upper Respiratory Infection, Adult Most upper respiratory infections (URIs) are a viral infection of the air passages leading to the lungs. A URI affects the nose, throat, and upper air passages. The most common type of URI is nasopharyngitis and is typically referred to as "the common cold." URIs run their course and usually go away on their own. Most of the time, a URI does not require medical attention, but sometimes a bacterial infection in the upper airways can follow a viral infection. This is called a secondary infection. Sinus and middle ear infections are common types of secondary upper respiratory infections. Bacterial pneumonia can also complicate a URI. A URI can worsen asthma and chronic obstructive pulmonary disease (COPD). Sometimes, these complications can require emergency medical care and may be life threatening.  CAUSES Almost all URIs are caused by viruses. A virus is a type of germ and can spread from one person to another.  RISKS FACTORS You may be at risk for a URI if:   You smoke.   You have chronic heart or lung disease.  You have a weakened defense (immune) system.   You are very young or very old.   You have nasal allergies or asthma.  You work in crowded or poorly ventilated areas.  You work in health care facilities or schools. SIGNS AND SYMPTOMS  Symptoms typically develop 2-3 days after you come in contact with a cold virus. Most viral URIs last 7-10 days. However, viral URIs from the influenza virus (flu virus) can last 14-18 days and are typically more severe. Symptoms may include:   Runny or stuffy (congested) nose.   Sneezing.   Cough.   Sore throat.   Headache.   Fatigue.   Fever.   Loss of appetite.   Pain in your forehead, behind your eyes, and over your cheekbones (sinus pain).  Muscle aches.  DIAGNOSIS  Your health care provider may diagnose a URI by:  Physical exam.  Tests to check that your symptoms are not due to  another condition such as:  Strep throat.  Sinusitis.  Pneumonia.  Asthma. TREATMENT  A URI goes away on its own with time. It cannot be cured with medicines, but medicines may be prescribed or recommended to relieve symptoms. Medicines may help:  Reduce your fever.  Reduce your cough.  Relieve nasal congestion. HOME CARE INSTRUCTIONS   Take medicines only as directed by your health care provider.   Gargle warm saltwater or take cough drops to comfort your throat as directed by your health care provider.  Use a warm mist humidifier or inhale steam from a shower to increase air moisture. This may make it easier to breathe.  Drink enough fluid to keep your urine clear or pale yellow.   Eat soups and other clear broths and maintain good nutrition.   Rest as needed.   Return to work when your temperature has returned to normal or as your health care provider advises. You may need to stay home longer to avoid infecting others. You can also use a face mask and careful hand washing to prevent spread of the virus.  Increase the usage of your inhaler if you have asthma.   Do not use any tobacco products, including cigarettes, chewing tobacco, or electronic cigarettes. If you need help quitting, ask your health care provider. PREVENTION  The best way to protect yourself from getting a cold is to practice good hygiene.   Avoid oral or hand contact with people with cold   symptoms.   Wash your hands often if contact occurs.  There is no clear evidence that vitamin C, vitamin E, echinacea, or exercise reduces the chance of developing a cold. However, it is always recommended to get plenty of rest, exercise, and practice good nutrition.  SEEK MEDICAL CARE IF:   You are getting worse rather than better.   Your symptoms are not controlled by medicine.   You have chills.  You have worsening shortness of breath.  You have brown or red mucus.  You have yellow or brown nasal  discharge.  You have pain in your face, especially when you bend forward.  You have a fever.  You have swollen neck glands.  You have pain while swallowing.  You have white areas in the back of your throat. SEEK IMMEDIATE MEDICAL CARE IF:   You have severe or persistent:  Headache.  Ear pain.  Sinus pain.  Chest pain.  You have chronic lung disease and any of the following:  Wheezing.  Prolonged cough.  Coughing up blood.  A change in your usual mucus.  You have a stiff neck.  You have changes in your:  Vision.  Hearing.  Thinking.  Mood. MAKE SURE YOU:   Understand these instructions.  Will watch your condition.  Will get help right away if you are not doing well or get worse.   This information is not intended to replace advice given to you by your health care provider. Make sure you discuss any questions you have with your health care provider.   Document Released: 04/10/2001 Document Revised: 03/01/2015 Document Reviewed: 01/20/2014 Elsevier Interactive Patient Education 2016 Elsevier Inc.  

## 2015-12-06 NOTE — ED Notes (Signed)
See triage noted   Afebrile at present but still having body aches with some non prod cough

## 2015-12-06 NOTE — ED Notes (Signed)
States she developed flu like sxs' about 1 week ago  Fever cough  congestion

## 2015-12-06 NOTE — ED Provider Notes (Signed)
[facility] Emergency Department Provider Note  ____________________________________________  Time seen: Approximately 12:30 PM  I have reviewed the triage vital signs and the nursing notes.   HISTORY  Chief Complaint Cough    HPI Haley Garza is a 40 y.o. female presents with a one-week history of cough congestion runny nose fever chills and body aches. Patient states that starting suddenly and is continued on this bite over-the-counter medications which offered no relief. She reports that the cough is nonproductive. Denies having a fever recently.   Past Medical History  Diagnosis Date  . Depression   . Anxiety   . Obesity   . Tobacco abuse   . IFG (impaired fasting glucose)     Patient Active Problem List   Diagnosis Date Noted  . 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 Bariatric surgery status 09/14/2015  . Asthma 09/14/2015  . Anxiety and depression 09/14/2015  . Tobacco abuse 09/14/2015  . IFG (impaired fasting glucose) 09/14/2015  . Morbid obesity ([place]) 05/26/2015  . Atypical chest pain 01/26/2015    Past Surgical History  Procedure Laterality Date  . Intrauterine device (iud) insertion      feb 2014  . Cesarean section      x 2   . Knee arthroscopy    . Bariatric surgery    . Tubal ligation      Current Outpatient Rx  Name  Route  Sig  Dispense  Refill  . albuterol (PROVENTIL HFA;VENTOLIN HFA) 108 (90 BASE) MCG/ACT inhaler   Inhalation   Inhale 2 puffs into the lungs every 6 (six) hours as needed for wheezing or shortness of breath.         Marland Kitchen azithromycin (ZITHROMAX Z-PAK) 250 MG tablet      Take 2 tablets (500 mg) on  Day 1,  followed by 1 tablet (250 mg) once daily on Days 2 through 5.   6 each   0   . buPROPion (WELLBUTRIN SR) 150 MG 12 hr tablet      take 1 tablet by mouth twice a day   60 tablet   0   . ferrous sulfate 325 (65 FE) MG tablet   Oral   Take 65 mg by mouth daily with breakfast.         .  guaiFENesin-codeine 100-10 MG/5ML syrup   Oral   Take 10 mLs by mouth every 4 (four) hours as needed for cough.   180 mL   0   . ibuprofen (ADVIL,MOTRIN) 800 MG tablet   Oral   Take 1 tablet (800 mg total) by mouth every 8 (eight) hours as needed.   30 tablet   0   . LORazepam (ATIVAN) 0.5 MG tablet   Oral   Take 0.5 mg by mouth at bedtime.         . Multiple Vitamins-Minerals (MULTIVITAMIN PO)   Oral   Take 1 tablet by mouth daily.         . naproxen (NAPROSYN) 500 MG tablet   Oral   Take 1 tablet (500 mg total) by mouth 2 (two) times daily with a meal.   20 tablet   0   . pantoprazole (PROTONIX) 40 MG tablet   Oral   Take 1 tablet (40 mg total) by mouth daily.   30 tablet   1   . phytonadione (VITAMIN K) 5 MG tablet   Oral   Take 60 mg by mouth once.         Marland Kitchen  sucralfate (CARAFATE) 1 G tablet   Oral   Take 1 tablet (1 g total) by mouth 4 (four) times daily.   60 tablet   0   . vitamin A 10000 UNIT capsule   Oral   Take 5,000 Units by mouth daily.         . vitamin B-12 (CYANOCOBALAMIN) 100 MCG tablet   Oral   Take 100 mcg by mouth daily.         . Vitamin D, Ergocalciferol, (DRISDOL) 50000 UNITS CAPS capsule   Oral   Take 2,000 Units by mouth 2 (two) times a week.          . vitamin E 100 UNIT capsule   Oral   Take 250 Units by mouth daily.           Allergies Oxycodone-acetaminophen  Family History  Problem Relation Age of Onset  . Hypertension Mother   . Hyperlipidemia Mother   . Depression Mother   . Fibroids Mother     Social History Social History  Substance Use Topics  . Smoking status: Current Some Day Smoker  . Smokeless tobacco: None  . Alcohol Use: No    Review of Systems Constitutional: Positive for fever and chills none recent Eyes: No visual changes. ENT: Positive sore throat Cardiovascular: Denies chest pain. Respiratory: Denies shortness of breath. Positive for cough Gastrointestinal: No abdominal  pain.  No nausea, no vomiting.  No diarrhea.  No constipation. Genitourinary: Negative for dysuria. Musculoskeletal: Negative for back pain. As a for generalized myalgia. Skin: Negative for rash. Neurological: Negative for headaches, focal weakness or numbness.  10-point ROS otherwise negative.  ____________________________________________   PHYSICAL EXAM:  VITAL SIGNS: ED Triage Vitals  Enc Vitals Group     BP 12/06/15 1155 122/81 mmHg     Pulse Rate 12/06/15 1155 67     Resp 12/06/15 1155 16     Temp 12/06/15 1155 98.6 F (37 C)     Temp Source 12/06/15 1155 Oral     SpO2 12/06/15 1155 97 %     Weight 12/06/15 1155 185 lb (83.915 kg)     Height 12/06/15 1155 5\' 7"  (1.702 m)     Head Cir --      Peak Flow --      Pain Score 12/06/15 1155 5     Pain Loc --      Pain Edu? --      Excl. in [place]? --     Constitutional: Alert and oriented. Well appearing and in no acute distress. Nose: Positive for congestion and rhinorrhea. Turbinates swollen bilaterally Mouth/Throat: Mucous membranes are moist.  Oropharynx non-erythematous. Neck: No stridor no cervical adenopathy noted   Cardiovascular: Normal rate, regular rhythm. Grossly normal heart sounds.  Good peripheral circulation. Respiratory: Normal respiratory effort.  No retractions. Lungs CTAB. No coarse rhonchi. Musculoskeletal: No lower extremity tenderness nor edema.  No joint effusions. Neurologic:  Normal speech and language. No gross focal neurologic deficits are appreciated. No gait instability. Skin:  Skin is warm, dry and intact. No rash noted. Psychiatric: Mood and affect are normal. Speech and behavior are normal.  ____________________________________________   LABS (all labs ordered are listed, but only abnormal results are displayed)  Labs Reviewed - No data to display   PROCEDURES  Procedure(s) performed: None  Critical Care performed: No  ____________________________________________   INITIAL  IMPRESSION / ASSESSMENT AND PLAN / ED COURSE  Pertinent labs & imaging results that were available during  my care of the patient were reviewed by me and considered in my medical decision making (see chart for details).  Acute upper respiratory infection. Rx given for Zithromax, Robitussin-AC, Motrin 800 mg 3 times a day. Patient to follow-up with her PCP or return to the ER with any worsening symptomology. Patient voices no other emergency medical complaints this time. Work excuse 48 hours given. ____________________________________________   FINAL CLINICAL IMPRESSION(S) / ED DIAGNOSES  Final diagnoses:  Acute URI     This chart was dictated using voice recognition software/Dragon. Despite best efforts to proofread, errors can occur which can change the meaning. Any change was purely unintentional.   Arlyss Repress, PA-C 12/06/15 [place], MD 12/06/15 [phone]

## 2016-09-25 ENCOUNTER — Encounter: Payer: Self-pay | Admitting: Emergency Medicine

## 2016-09-25 ENCOUNTER — Emergency Department: Payer: BC Managed Care – PPO

## 2016-09-25 ENCOUNTER — Emergency Department
Admission: EM | Admit: 2016-09-25 | Discharge: 2016-09-25 | Disposition: A | Discharge status: Home / Self Care | Payer: BC Managed Care – PPO | Attending: Emergency Medicine | Admitting: Emergency Medicine

## 2016-09-25 DIAGNOSIS — F17210 Nicotine dependence, cigarettes, uncomplicated: Secondary | ICD-10-CM | POA: Insufficient documentation

## 2016-09-25 DIAGNOSIS — R1032 Left lower quadrant pain: Secondary | ICD-10-CM | POA: Diagnosis present

## 2016-09-25 DIAGNOSIS — Z79899 Other long term (current) drug therapy: Secondary | ICD-10-CM | POA: Insufficient documentation

## 2016-09-25 DIAGNOSIS — D259 Leiomyoma of uterus, unspecified: Secondary | ICD-10-CM | POA: Insufficient documentation

## 2016-09-25 DIAGNOSIS — J45909 Unspecified asthma, uncomplicated: Secondary | ICD-10-CM | POA: Insufficient documentation

## 2016-09-25 DIAGNOSIS — R1030 Lower abdominal pain, unspecified: Secondary | ICD-10-CM

## 2016-09-25 LAB — URINALYSIS COMPLETE WITH MICROSCOPIC (ARMC ONLY)
BILIRUBIN URINE: NEGATIVE
Bacteria, UA: NONE SEEN
Glucose, UA: NEGATIVE mg/dL
HGB URINE DIPSTICK: NEGATIVE
LEUKOCYTES UA: NEGATIVE
Nitrite: NEGATIVE
PH: 5 (ref 5.0–8.0)
Protein, ur: NEGATIVE mg/dL
Specific Gravity, Urine: 1.028 (ref 1.005–1.030)
Squamous Epithelial / LPF: NONE SEEN

## 2016-09-25 LAB — COMPREHENSIVE METABOLIC PANEL
ALT: 24 U/L (ref 14–54)
AST: 22 U/L (ref 15–41)
Albumin: 4.1 g/dL (ref 3.5–5.0)
Alkaline Phosphatase: 42 U/L (ref 38–126)
Anion gap: 6 (ref 5–15)
BUN: 13 mg/dL (ref 6–20)
CALCIUM: 8.6 mg/dL — AB (ref 8.9–10.3)
CHLORIDE: 109 mmol/L (ref 101–111)
CO2: 23 mmol/L (ref 22–32)
CREATININE: 0.68 mg/dL (ref 0.44–1.00)
Glucose, Bld: 88 mg/dL (ref 65–99)
Potassium: 3.8 mmol/L (ref 3.5–5.1)
Sodium: 138 mmol/L (ref 135–145)
Total Bilirubin: 0.5 mg/dL (ref 0.3–1.2)
Total Protein: 7 g/dL (ref 6.5–8.1)

## 2016-09-25 LAB — CBC
HCT: 31.1 % — ABNORMAL LOW (ref 35.0–47.0)
Hemoglobin: 10 g/dL — ABNORMAL LOW (ref 12.0–16.0)
MCH: 28.4 pg (ref 26.0–34.0)
MCHC: 32.1 g/dL (ref 32.0–36.0)
MCV: 88.4 fL (ref 80.0–100.0)
PLATELETS: 296 10*3/uL (ref 150–440)
RBC: 3.52 MIL/uL — AB (ref 3.80–5.20)
RDW: 16.9 % — ABNORMAL HIGH (ref 11.5–14.5)
WBC: 5.9 10*3/uL (ref 3.6–11.0)

## 2016-09-25 LAB — LIPASE, BLOOD: LIPASE: 20 U/L (ref 11–51)

## 2016-09-25 MED ORDER — SODIUM CHLORIDE 0.9 % IV BOLUS (SEPSIS)
1000.0000 mL | Freq: Once | INTRAVENOUS | Status: AC
  Administered 2016-09-25: 1000 mL via INTRAVENOUS

## 2016-09-25 MED ORDER — IOPAMIDOL (ISOVUE-300) INJECTION 61%
100.0000 mL | Freq: Once | INTRAVENOUS | Status: AC | PRN
  Administered 2016-09-25: 100 mL via INTRAVENOUS
  Filled 2016-09-25: qty 100

## 2016-09-25 MED ORDER — IOPAMIDOL (ISOVUE-300) INJECTION 61%
30.0000 mL | Freq: Once | INTRAVENOUS | Status: AC | PRN
  Administered 2016-09-25: 30 mL via ORAL
  Filled 2016-09-25: qty 30

## 2016-09-25 MED ORDER — HYDROCODONE-ACETAMINOPHEN 5-325 MG PO TABS: 1.0000 | ORAL_TABLET | Freq: Four times a day (QID) | ORAL | 0 refills | Status: DC | PRN

## 2016-09-25 NOTE — ED Triage Notes (Signed)
Pt to ed with c/o abd pain intermittent that started today.  Pt states sitting makes pain better but standing or walking increases the pain, also reports nausea denies v/d.  Reports last BM was this am, WNL.

## 2016-09-25 NOTE — ED Notes (Signed)
Pt. Verbalizes understanding of d/c instructions and follow-up. VS stable and pain controlled per pt.  Pt. In NAD at time of d/c and denies further concerns regarding this visit. Pt. Stable at the time of departure from the unit, departing unit by the safest and most appropriate manner per that pt condition and limitations. Pt advised to return to the ED at any time for emergent concerns, or for new/worsening symptoms.   

## 2016-09-25 NOTE — ED Provider Notes (Signed)
[facility] Emergency Department Provider Note  ____________________________________________   First MD Initiated Contact with Patient 09/25/16 1815     (approximate)  I have reviewed the triage vital signs and the nursing notes.   HISTORY  Chief Complaint Abdominal Pain   HPI Haley Garza is a 40 y.o. female with a history of gastric bypass who is presenting to the emergency department with left lower quadrant abdominal pain which she says is been ongoing since this morning. She says that the pain with movement worsens. She denies any radiation but says the pain has steadily migrated to the left flank and upper quadrant today. She denies any nausea or vomiting. Said that she had normal bowel movement this morning. Denies any vaginal discharge but says that she started her period yesterday and does have intermittent heavy bleeds and now has a known history of anemia and is supposed be taking iron but isn't. No concern for STDs at this time. Patient says that her pain is a 2 out of 10 at this time and is not requesting any pain medications although was offered.   Past Medical History:  Diagnosis Date  . Anxiety   . Depression   . IFG (impaired fasting glucose)   . Obesity   . Tobacco abuse     Patient Active Problem List   Diagnosis Date Noted  . 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 Bariatric surgery status 09/14/2015  . Asthma 09/14/2015  . Anxiety and depression 09/14/2015  . Tobacco abuse 09/14/2015  . IFG (impaired fasting glucose) 09/14/2015  . Morbid obesity ([place]) 05/26/2015  . Atypical chest pain 01/26/2015    Past Surgical History:  Procedure Laterality Date  . BARIATRIC SURGERY    . CESAREAN SECTION     x 2   . INTRAUTERINE DEVICE (IUD) INSERTION     feb 2014  . KNEE ARTHROSCOPY    . TUBAL LIGATION      Prior to Admission medications   Medication Sig Start Date End Date Taking? Authorizing Provider  albuterol (PROVENTIL  HFA;VENTOLIN HFA) 108 (90 BASE) MCG/ACT inhaler Inhale 2 puffs into the lungs every 6 (six) hours as needed for wheezing or shortness of breath.    Historical Provider, MD  azithromycin (ZITHROMAX Z-PAK) 250 MG tablet Take 2 tablets (500 mg) on  Day 1,  followed by 1 tablet (250 mg) once daily on Days 2 through 5. 12/06/15   Arlyss Repress, PA-C  buPROPion [facility] SR) 150 MG 12 hr tablet take 1 tablet by mouth twice a day 05/13/15   Kathrine Haddock, NP  ferrous sulfate 325 (65 FE) MG tablet Take 65 mg by mouth daily with breakfast.    Historical Provider, MD  guaiFENesin-codeine 100-10 MG/5ML syrup Take 10 mLs by mouth every 4 (four) hours as needed for cough. 12/06/15   Pierce Crane Beers, PA-C  ibuprofen (ADVIL,MOTRIN) 800 MG tablet Take 1 tablet (800 mg total) by mouth every 8 (eight) hours as needed. 12/06/15   Pierce Crane Beers, PA-C  LORazepam (ATIVAN) 0.5 MG tablet Take 0.5 mg by mouth at bedtime.    Historical Provider, MD  Multiple Vitamins-Minerals (MULTIVITAMIN PO) Take 1 tablet by mouth daily.    Historical Provider, MD  naproxen (NAPROSYN) 500 MG tablet Take 1 tablet (500 mg total) by mouth 2 (two) times daily with a meal. 04/30/15   Sable Feil, PA-C  pantoprazole (PROTONIX) 40 MG tablet Take 1 tablet (40 mg total) by mouth daily. 08/13/15 08/12/16  Nance Pear, MD  phytonadione (VITAMIN K) 5 MG tablet Take 60 mg by mouth once.    Historical Provider, MD  sucralfate (CARAFATE) 1 G tablet Take 1 tablet (1 g total) by mouth 4 (four) times daily. 08/13/15   Nance Pear, MD  vitamin A 10000 UNIT capsule Take 5,000 Units by mouth daily.    Historical Provider, MD  vitamin B-12 (CYANOCOBALAMIN) 100 MCG tablet Take 100 mcg by mouth daily.    Historical Provider, MD  Vitamin D, Ergocalciferol, (DRISDOL) 50000 UNITS CAPS capsule Take 2,000 Units by mouth 2 (two) times a week.     Historical Provider, MD  vitamin E 100 UNIT capsule Take 250 Units by mouth daily.    Historical Provider, MD     Allergies Oxycodone-acetaminophen  Family History  Problem Relation Age of Onset  . Hypertension Mother   . Hyperlipidemia Mother   . Depression Mother   . Fibroids Mother     Social History Social History  Substance Use Topics  . Smoking status: Current Some Day Smoker  . Smokeless tobacco: Never Used  . Alcohol use No    Review of Systems Constitutional: No fever/chills Eyes: No visual changes. ENT: No sore throat. Cardiovascular: Denies chest pain. Respiratory: Denies shortness of breath. Gastrointestinal:   No nausea, no vomiting.  No diarrhea.  No constipation. Genitourinary: Negative for dysuria. Musculoskeletal: Negative for back pain. Skin: Negative for rash. Neurological: Negative for headaches, focal weakness or numbness.  10-point ROS otherwise negative.  ____________________________________________   PHYSICAL EXAM:  VITAL SIGNS: ED Triage Vitals  Enc Vitals Group     BP 09/25/16 1634 (!) 112/59     Pulse Rate 09/25/16 1634 (!) 53     Resp 09/25/16 1634 18     Temp 09/25/16 1634 98.2 F (36.8 C)     Temp Source 09/25/16 1634 Oral     SpO2 09/25/16 1634 100 %     Weight 09/25/16 1634 144 lb (65.3 kg)     Height 09/25/16 1634 5\' 7"  (1.702 m)     Head Circumference --      Peak Flow --      Pain Score 09/25/16 1635 6     Pain Loc --      Pain Edu? --      Excl. in [place]? --     Constitutional: Alert and oriented. Well appearing and in no acute distress. Eyes: Conjunctivae are normal. PERRL. EOMI. Head: Atraumatic. Nose: No congestion/rhinnorhea. Mouth/Throat: Mucous membranes are moist.   Neck: No stridor.   Cardiovascular: Normal rate, regular rhythm. Grossly normal heart sounds.   Respiratory: Normal respiratory effort.  No retractions. Lungs CTAB. Gastrointestinal: Soft With mild tenderness to palpation to the right lower quadrant. No distention.No CVA tenderness. Musculoskeletal: No lower extremity tenderness nor edema.  No joint  effusions. Neurologic:  Normal speech and language. No gross focal neurologic deficits are appreciated.  Skin:  Skin is warm, dry and intact. No rash noted. Psychiatric: Mood and affect are normal. Speech and behavior are normal.  ____________________________________________   LABS (all labs ordered are listed, but only abnormal results are displayed)  Labs Reviewed  COMPREHENSIVE METABOLIC PANEL - Abnormal; Notable for the following:       Result Value   Calcium 8.6 (*)    All other components within normal limits  CBC - Abnormal; Notable for the following:    RBC 3.52 (*)    Hemoglobin 10.0 (*)    HCT 31.1 (*)  RDW 16.9 (*)    All other components within normal limits  URINALYSIS COMPLETEWITH MICROSCOPIC ([facility] ONLY) - Abnormal; Notable for the following:    Color, Urine YELLOW (*)    APPearance CLEAR (*)    Ketones, ur TRACE (*)    All other components within normal limits  LIPASE, BLOOD  POC URINE PREG, ED   ____________________________________________  EKG   ____________________________________________  RADIOLOGY  CT Abdomen Pelvis W Contrast (Accession [id number]) (Order [id number])  Imaging  Date: 09/25/2016 Department: [facility] EMERGENCY DEPARTMENT Released By/Authorizing: Orbie Pyo, MD (auto-released)  Exam Information   Status Exam Begun  Exam Ended   Final [99] 09/25/2016 8:21 PM 09/25/2016 8:33 PM  PACS Images   Show images for CT Abdomen Pelvis W Contrast  Study Result   CLINICAL DATA:  40 y/o F; right lower quadrant pain history of ovarian cysts, tubal ligation, and gastric bypass.  EXAM: CT ABDOMEN AND PELVIS WITH CONTRAST  TECHNIQUE: Multidetector CT imaging of the abdomen and pelvis was performed using the standard protocol following bolus administration of intravenous contrast.  CONTRAST:  136mL ISOVUE-300 IOPAMIDOL (ISOVUE-300) INJECTION 61%  COMPARISON:  None.  FINDINGS: Lower chest: No  acute abnormality.  Hepatobiliary: No focal liver lesion. No intra or extrahepatic biliary ductal dilatation. 4 mm density within the gallbladder is likely a stone. (Series 2, image 26).  Pancreas: Unremarkable. No pancreatic ductal dilatation or surrounding inflammatory changes.  Spleen: Normal in size without focal abnormality.  Adrenals/Urinary Tract: Adrenal glands are unremarkable. Kidneys are normal, without renal calculi, focal lesion, or hydronephrosis. Bladder is unremarkable.  Stomach/Bowel: Postsurgical changes related to gastric bypass. No obstructive changes of bowel. No appreciable inflammatory changes of bowel. Normal appendix.  Vascular/Lymphatic: No significant vascular findings are present. No enlarged abdominal or pelvic lymph nodes.  Reproductive: Uterine fundal mass likely myoma measuring 7.1 x 7.9 x 9.7 cm (AP x ML x CC).  Other: Tiny paraumbilical hernia containing fat.  Musculoskeletal: No acute osseous abnormality. 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  IMPRESSION: 1. No acute process identified as explanation for pain. Normal appendix. 2. 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 3. Probable 4 mm gallstone.   Electronically Signed   By: Kristine Garbe M.D.   On: 09/25/2016 20:51    ____________________________________________   PROCEDURES  Procedure(s) performed:   Procedures  Critical Care performed:   ____________________________________________   INITIAL IMPRESSION / ASSESSMENT AND PLAN / ED COURSE  Pertinent labs & imaging results that were available during my care of the patient were reviewed by me and considered in my medical decision making (see chart for details).   Clinical Course   ----------------------------------------- 9:09 PM on 09/25/2016 -----------------------------------------  Patient is resting without any distress at this time. Likely symptoms are  arising from the myoma. I discussed this with the patient and her mother who is at the bedside. This is a family history of large myomas in the family. Patient is seen at [facility] side OB/GYN. She is also aware of the gallstones but this is unlikely related to the patient's chief complaint at this time. Will be discharged home and will follow up with her OB/GYN. She understands plan and is willing to comply.  Patient has allergy to Percocet that she is able to tolerate Vicodin. ____________________________________________   FINAL CLINICAL IMPRESSION(S) / ED DIAGNOSES   Lower abdominal pain. Fibroid uterus.   NEW MEDICATIONS STARTED DURING THIS VISIT:  New Prescriptions   No medications  on file     Note:  This document was prepared using Dragon voice recognition software and may include unintentional dictation errors.    Orbie Pyo, MD 09/25/16 2110

## 2017-01-01 ENCOUNTER — Other Ambulatory Visit: Payer: Self-pay | Admitting: Obstetrics and Gynecology

## 2017-01-01 NOTE — Telephone Encounter (Signed)
You saw patient in December 2017 for fibroids. I do not see in your note or on her medication list where this medication was given. Please advise. KJ CMA

## 2017-01-09 HISTORY — PX: UTERINE FIBROID EMBOLIZATION: SHX825

## 2017-05-11 ENCOUNTER — Emergency Department: Payer: BC Managed Care – PPO

## 2017-05-11 ENCOUNTER — Encounter: Payer: Self-pay | Admitting: Emergency Medicine

## 2017-05-11 ENCOUNTER — Emergency Department
Admission: EM | Admit: 2017-05-11 | Discharge: 2017-05-12 | Disposition: A | Discharge status: Home / Self Care | Payer: BC Managed Care – PPO | Attending: Emergency Medicine | Admitting: Emergency Medicine

## 2017-05-11 DIAGNOSIS — Z79899 Other long term (current) drug therapy: Secondary | ICD-10-CM | POA: Diagnosis not present

## 2017-05-11 DIAGNOSIS — R1030 Lower abdominal pain, unspecified: Secondary | ICD-10-CM | POA: Insufficient documentation

## 2017-05-11 DIAGNOSIS — F17200 Nicotine dependence, unspecified, uncomplicated: Secondary | ICD-10-CM | POA: Insufficient documentation

## 2017-05-11 DIAGNOSIS — D259 Leiomyoma of uterus, unspecified: Secondary | ICD-10-CM | POA: Diagnosis not present

## 2017-05-11 DIAGNOSIS — Z791 Long term (current) use of non-steroidal anti-inflammatories (NSAID): Secondary | ICD-10-CM | POA: Insufficient documentation

## 2017-05-11 DIAGNOSIS — J45909 Unspecified asthma, uncomplicated: Secondary | ICD-10-CM | POA: Diagnosis not present

## 2017-05-11 LAB — COMPREHENSIVE METABOLIC PANEL
ALK PHOS: 51 U/L (ref 38–126)
ALT: 17 U/L (ref 14–54)
AST: 23 U/L (ref 15–41)
Albumin: 3.7 g/dL (ref 3.5–5.0)
Anion gap: 7 (ref 5–15)
BUN: 11 mg/dL (ref 6–20)
CALCIUM: 8.7 mg/dL — AB (ref 8.9–10.3)
CO2: 24 mmol/L (ref 22–32)
CREATININE: 0.81 mg/dL (ref 0.44–1.00)
Chloride: 109 mmol/L (ref 101–111)
Glucose, Bld: 88 mg/dL (ref 65–99)
Potassium: 4.1 mmol/L (ref 3.5–5.1)
SODIUM: 140 mmol/L (ref 135–145)
TOTAL PROTEIN: 6.5 g/dL (ref 6.5–8.1)
Total Bilirubin: 0.7 mg/dL (ref 0.3–1.2)

## 2017-05-11 LAB — CBC
HCT: 30 % — ABNORMAL LOW (ref 35.0–47.0)
HEMOGLOBIN: 9.8 g/dL — AB (ref 12.0–16.0)
MCH: 28.3 pg (ref 26.0–34.0)
MCHC: 32.8 g/dL (ref 32.0–36.0)
MCV: 86.2 fL (ref 80.0–100.0)
PLATELETS: 308 10*3/uL (ref 150–440)
RBC: 3.48 MIL/uL — AB (ref 3.80–5.20)
RDW: 16.6 % — ABNORMAL HIGH (ref 11.5–14.5)
WBC: 5.3 10*3/uL (ref 3.6–11.0)

## 2017-05-11 LAB — URINALYSIS, COMPLETE (UACMP) WITH MICROSCOPIC
BILIRUBIN URINE: NEGATIVE
Bacteria, UA: NONE SEEN
GLUCOSE, UA: NEGATIVE mg/dL
HGB URINE DIPSTICK: NEGATIVE
KETONES UR: NEGATIVE mg/dL
LEUKOCYTES UA: NEGATIVE
NITRITE: NEGATIVE
PH: 5 (ref 5.0–8.0)
PROTEIN: NEGATIVE mg/dL
RBC / HPF: NONE SEEN RBC/hpf (ref 0–5)
Specific Gravity, Urine: 1.028 (ref 1.005–1.030)

## 2017-05-11 LAB — PREGNANCY, URINE: Preg Test, Ur: NEGATIVE

## 2017-05-11 LAB — LIPASE, BLOOD: Lipase: 23 U/L (ref 11–51)

## 2017-05-11 MED ORDER — IOPAMIDOL (ISOVUE-300) INJECTION 61%
30.0000 mL | Freq: Once | INTRAVENOUS | Status: AC
  Administered 2017-05-11: 30 mL via ORAL

## 2017-05-11 MED ORDER — ACETAMINOPHEN 500 MG PO TABS
1000.0000 mg | ORAL_TABLET | Freq: Once | ORAL | Status: AC
  Administered 2017-05-11: 1000 mg via ORAL
  Filled 2017-05-11: qty 2

## 2017-05-11 MED ORDER — IOPAMIDOL (ISOVUE-300) INJECTION 61%
100.0000 mL | Freq: Once | INTRAVENOUS | Status: AC | PRN
  Administered 2017-05-11: 100 mL via INTRAVENOUS

## 2017-05-11 NOTE — ED Notes (Signed)
IV attempted by this RN x2. Requesting another RN to look for IV site at this time.

## 2017-05-11 NOTE — ED Triage Notes (Signed)
Pt c/o LLQ pain X 2 days. Denies vomiting or diarrhea.  Has had some nausea. No fevers.  Skin warm and dry. Respirations unlabored.  NAD. VSS

## 2017-05-11 NOTE — ED Notes (Signed)
Pt unable to urinate at this time.  

## 2017-05-11 NOTE — ED Notes (Signed)
IV attempted by Ena Dawley RN x2. Ann RN now into room to attempt IV access.

## 2017-05-11 NOTE — ED Provider Notes (Signed)
[facility] Emergency Department Provider Note   ____________________________________________   I have reviewed the triage vital signs and the nursing notes.   HISTORY  Chief Complaint Abdominal Pain   History limited by: Not Limited   HPI Haley Garza is a 41 y.o. female who presents to the emergency department today because of concerns for left sided abdominal pain. The patient states the pain is been present for the past 3 days. It has been constant. It does come and go. It is worse with movement. She states that she feels a slight amount of relief after defecating. She has not noticed any change or blood in her defecation. Had some associated nausea without any vomiting. No fevers.   Past Medical History:  Diagnosis Date  . Anxiety   . Depression   . IFG (impaired fasting glucose)   . Obesity   . Tobacco abuse     Patient Active Problem List   Diagnosis Date Noted  . 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 Bariatric surgery status 09/14/2015  . Asthma 09/14/2015  . Anxiety and depression 09/14/2015  . Tobacco abuse 09/14/2015  . IFG (impaired fasting glucose) 09/14/2015  . Morbid obesity ([place]) 05/26/2015  . Atypical chest pain 01/26/2015    Past Surgical History:  Procedure Laterality Date  . BARIATRIC SURGERY    . CESAREAN SECTION     x 2   . INTRAUTERINE DEVICE (IUD) INSERTION     feb 2014  . KNEE ARTHROSCOPY    . TUBAL LIGATION      Prior to Admission medications   Medication Sig Start Date End Date Taking? Authorizing Provider  albuterol (PROVENTIL HFA;VENTOLIN HFA) 108 (90 BASE) MCG/ACT inhaler Inhale 2 puffs into the lungs every 6 (six) hours as needed for wheezing or shortness of breath.    [provider]  azithromycin (ZITHROMAX Z-PAK) 250 MG tablet Take 2 tablets (500 mg) on  Day 1,  followed by 1 tablet (250 mg) once daily on Days 2 through 5. 12/06/15   Beers, Pierce Crane, PA-C  buPROPion [facility] SR) 150 MG 12 hr  tablet take 1 tablet by mouth twice a day 05/13/15   Kathrine Haddock, NP  ferrous sulfate 325 (65 FE) MG tablet Take 65 mg by mouth daily with breakfast.    [provider]  guaiFENesin-codeine 100-10 MG/5ML syrup Take 10 mLs by mouth every 4 (four) hours as needed for cough. 12/06/15   Beers, Pierce Crane, PA-C  HYDROcodone-acetaminophen (NORCO/VICODIN) 5-325 MG tablet Take 1 tablet by mouth every 6 (six) hours as needed for moderate pain or severe pain. 09/25/16   Schaevitz, Randall An, MD  ibuprofen (ADVIL,MOTRIN) 800 MG tablet Take 1 tablet (800 mg total) by mouth every 8 (eight) hours as needed. 12/06/15   Beers, Pierce Crane, PA-C  LOESTRIN FE 1.5/30 1.5-30 MG-MCG tablet TAKE ONE TABLET BY MOUTH ONCE DAILY 01/02/17   [name], MD  LORazepam (ATIVAN) 0.5 MG tablet Take 0.5 mg by mouth at bedtime.    [provider]  Multiple Vitamins-Minerals (MULTIVITAMIN PO) Take 1 tablet by mouth daily.    [provider]  naproxen (NAPROSYN) 500 MG tablet Take 1 tablet (500 mg total) by mouth 2 (two) times daily with a meal. 04/30/15   Sable Feil, PA-C  pantoprazole (PROTONIX) 40 MG tablet Take 1 tablet (40 mg total) by mouth daily. 08/13/15 08/12/16  Nance Pear, MD  phytonadione (VITAMIN K) 5 MG tablet Take 60 mg by mouth once.  [provider]  sucralfate (CARAFATE) 1 G tablet Take 1 tablet (1 g total) by mouth 4 (four) times daily. 08/13/15   Nance Pear, MD  vitamin A 10000 UNIT capsule Take 5,000 Units by mouth daily.    [provider]  vitamin B-12 (CYANOCOBALAMIN) 100 MCG tablet Take 100 mcg by mouth daily.    [provider]  Vitamin D, Ergocalciferol, (DRISDOL) 50000 UNITS CAPS capsule Take 2,000 Units by mouth 2 (two) times a week.     [provider]  vitamin E 100 UNIT capsule Take 250 Units by mouth daily.    [provider]    Allergies Oxycodone-acetaminophen  Family History  Problem Relation Age of  Onset  . Hypertension Mother   . Hyperlipidemia Mother   . Depression Mother   . Fibroids Mother     Social History Social History  Substance Use Topics  . Smoking status: Current Some Day Smoker  . Smokeless tobacco: Never Used  . Alcohol use No    Review of Systems Constitutional: No fever/chills Eyes: No visual changes. ENT: No sore throat. Cardiovascular: Denies chest pain. Respiratory: Denies shortness of breath. Gastrointestinal: Positive for abdominal pain. Genitourinary: Negative for dysuria. Musculoskeletal: Negative for back pain. Skin: Negative for rash. Neurological: Negative for headaches, focal weakness or numbness.  ____________________________________________   PHYSICAL EXAM:  VITAL SIGNS: ED Triage Vitals [05/11/17 1506]  Enc Vitals Group     BP 122/70     Pulse Rate 71     Resp 16     Temp 98.6 F (37 C)     Temp Source Oral     SpO2 100 %     Weight 145 lb (65.8 kg)     Height 5\' 7"  (1.702 m)     Head Circumference      Peak Flow      Pain Score 6    Constitutional: Alert and oriented. Well appearing and in no distress. Eyes: Conjunctivae are normal.  ENT   Head: Normocephalic and atraumatic.   Nose: No congestion/rhinnorhea.   Mouth/Throat: Mucous membranes are moist.   Neck: No stridor. Hematological/Lymphatic/Immunilogical: No cervical lymphadenopathy. Cardiovascular: Normal rate, regular rhythm.  No murmurs, rubs, or gallops. Respiratory: Normal respiratory effort without tachypnea nor retractions. Breath sounds are clear and equal bilaterally. No wheezes/rales/rhonchi. Gastrointestinal: Soft and tender to palpation on the left side. NO rebound. No guarding.  Genitourinary: Deferred Musculoskeletal: Normal range of motion in all extremities. No lower extremity edema. Neurologic:  Normal speech and language. No gross focal neurologic deficits are appreciated.  Skin:  Skin is warm, dry and intact. No rash  noted. Psychiatric: Mood and affect are normal. Speech and behavior are normal. Patient exhibits appropriate insight and judgment.  ____________________________________________    LABS (pertinent positives/negatives)  Labs Reviewed  COMPREHENSIVE METABOLIC PANEL - Abnormal; Notable for the following:       Result Value   Calcium 8.7 (*)    All other components within normal limits  CBC - Abnormal; Notable for the following:    RBC 3.48 (*)    Hemoglobin 9.8 (*)    HCT 30.0 (*)    RDW 16.6 (*)    All other components within normal limits  URINALYSIS, COMPLETE (UACMP) WITH MICROSCOPIC - Abnormal; Notable for the following:    Color, Urine YELLOW (*)    APPearance CLEAR (*)    Squamous Epithelial / LPF 0-5 (*)    All other components within normal limits  LIPASE, BLOOD  PREGNANCY, URINE     ____________________________________________   EKG  None  ____________________________________________    RADIOLOGY  CT pending at time of sign out   ____________________________________________   PROCEDURES  Procedures  ____________________________________________   INITIAL IMPRESSION / ASSESSMENT AND PLAN / ED COURSE  Pertinent labs & imaging results that were available during my care of the patient were reviewed by me and considered in my medical decision making (see chart for details).  Patient presented to the emergency department today because of concerns for abdominal pain. On exam she was tender left abdomen. Blood work without any obvious etiology. Given location of pain will plan on getting CT scan to evaluate for diverticulitis versus other intra-abdominal pathology.  ____________________________________________   FINAL CLINICAL IMPRESSION(S) / ED DIAGNOSES  Abdominal pain  Note: This dictation was prepared with Dragon dictation. Any transcriptional errors that result from this process are unintentional     Nance Pear, MD 05/11/17 2318

## 2017-05-12 MED ORDER — ACETAMINOPHEN-CODEINE #3 300-30 MG PO TABS: 1.0000 | ORAL_TABLET | Freq: Four times a day (QID) | ORAL | 0 refills | Status: DC | PRN

## 2017-05-12 MED ORDER — ACETAMINOPHEN-CODEINE #3 300-30 MG PO TABS
1.0000 | ORAL_TABLET | Freq: Once | ORAL | Status: AC
  Administered 2017-05-12: 1 via ORAL
  Filled 2017-05-12: qty 1

## 2017-05-12 NOTE — ED Provider Notes (Signed)
-----------------------------------------   12:21 AM on 05/12/2017 -----------------------------------------  CT abdomen and pelvis interpreted per Dr. Francoise Ceo: 1. No CT evidence for acute intra-abdominal or pelvic pathology.  2. Large uterine mass presumably a fibroid  3. Small amount of free fluid in the pelvis.   Updated patient of CT imaging results. She has had fibroids previously, sees Dr. Star Age from [facility] OB[facility]. Will prescribe Tylenol with Codeine which she states she can tolerate, and patient will follow-up with GYN next week. Strict return precautions given. Patient verbalizes understanding and agrees with plan of care.   Paulette Blanch, MD 05/12/17 [phone]

## 2017-05-12 NOTE — Discharge Instructions (Signed)
1. You may take ibuprofen as needed for pain; Tylenol No. 3 as needed for more severe pain. 2. Return to the ER for worsening symptoms, persistent vomiting, difficulty breathing or other concerns.

## 2017-05-13 ENCOUNTER — Telehealth: Payer: Self-pay | Admitting: Obstetrics and Gynecology

## 2017-05-13 NOTE — Telephone Encounter (Signed)
Spoke with pt and advised as below. Scheduled pt for the next available OV with MD. Thanks TNP

## 2017-05-13 NOTE — Telephone Encounter (Signed)
There are no openings today. First available is all we have. ER follow ups have to be with MD.

## 2017-05-13 NOTE — Telephone Encounter (Signed)
Pt stated that she went to ER yesterday 05/12/17 for pelvic pain and was advised she needed to follow up with our office today. Pt stated she is willing to see anyone in the office. Can pt be worked in today? Please advise. Thanks TNP

## 2017-05-28 ENCOUNTER — Encounter: Payer: Self-pay | Admitting: Obstetrics and Gynecology

## 2017-05-28 ENCOUNTER — Ambulatory Visit (INDEPENDENT_AMBULATORY_CARE_PROVIDER_SITE_OTHER): Payer: BC Managed Care – PPO | Admitting: Obstetrics and Gynecology

## 2017-05-28 VITALS — BP 106/62 | HR 66 | Wt 145.0 lb

## 2017-05-28 DIAGNOSIS — D250 Submucous leiomyoma of uterus: Secondary | ICD-10-CM

## 2017-05-28 NOTE — Progress Notes (Signed)
Obstetrics & Gynecology Office Visit   Chief Complaint:  Chief Complaint  Patient presents with  . Follow-up    History of Present Illness: Patient is a 41 year old female with known uterine fibroid.  She has a longstanding history of menorrhagia, attempted use of IUD in February or 2014 but IUD underwent expulsion 7 months after initial placement.  After IUD expulsion she was placed on LoLoestrin FE.  She was diagnoed with a large intramural fundal vs subserosal fibroid in November of 2017 and chose to undergo uterine artery embolization.  She presented to the ER 2 week ago with worsening abdominal pain.  CT scan 09/25/2016 9.7cm fibroid, she opted to undergo uterine artery embolization, with interval decrease in size of the fibroid to 5cm on CT 05/11/17.  CBC 8.7 on 10/05/2016, still low at 8.8 on 05/11/2017 Pap 02/04/16 NIL HPV negative.  Given continued symptoms the patient opts to pursue definitive surgical management which she was not ready to do previously.   Review of Systems: 10 point review of systems negative unless otherwise noted in HPI  Past Medical History:  Past Medical History:  Diagnosis Date  . Anxiety   . Depression   . IFG (impaired fasting glucose)   . Obesity   . Tobacco abuse     Past Surgical History:  Past Surgical History:  Procedure Laterality Date  . BARIATRIC SURGERY    . CESAREAN SECTION     x 2   . INTRAUTERINE DEVICE (IUD) INSERTION     feb 2014  . KNEE ARTHROSCOPY    . TUBAL LIGATION    . UTERINE FIBROID EMBOLIZATION  01/09/2017   Triangular vascular    Gynecologic History: Patient's last menstrual period was 05/27/2017. Pap 02/04/16 NIL HPV negative  Obstetric History: G3P3  Family History:  Family History  Problem Relation Age of Onset  . Hypertension Mother   . Hyperlipidemia Mother   . Depression Mother   . Fibroids Mother     Social History:  Social History   Social History  . Marital status: Married    Spouse name: N/A   . Number of children: N/A  . Years of education: N/A   Occupational History  . Not on file.   Social History Main Topics  . Smoking status: Current Some Day Smoker  . Smokeless tobacco: Never Used  . Alcohol use No  . Drug use: No  . Sexual activity: Not Currently    Birth control/ protection: None   Other Topics Concern  . Not on file   Social History Narrative  . No narrative on file    Allergies:  Allergies  Allergen Reactions  . Oxycodone-Acetaminophen Itching    Medications: Prior to Admission medications   Medication Sig Start Date End Date Taking? Authorizing Provider  acetaminophen-codeine (TYLENOL #3) 300-30 MG tablet Take 1 tablet by mouth every 6 (six) hours as needed for moderate pain. 05/12/17  Yes Paulette Blanch, MD  albuterol (PROVENTIL HFA;VENTOLIN HFA) 108 (90 BASE) MCG/ACT inhaler Inhale 2 puffs into the lungs every 6 (six) hours as needed for wheezing or shortness of breath.   Yes [provider]  ferrous sulfate 325 (65 FE) MG tablet Take 65 mg by mouth daily with breakfast.   Yes [provider]  Multiple Vitamins-Minerals (MULTIVITAMIN PO) Take 1 tablet by mouth daily.   Yes [provider]  naproxen (NAPROSYN) 500 MG tablet Take 1 tablet (500 mg total) by mouth 2 (two)  times daily with a meal. 04/30/15  Yes Sable Feil, PA-C  phytonadione (VITAMIN K) 5 MG tablet Take 60 mg by mouth once.   Yes [provider]  vitamin A 10000 UNIT capsule Take 5,000 Units by mouth daily.   Yes [provider]  vitamin B-12 (CYANOCOBALAMIN) 100 MCG tablet Take 100 mcg by mouth daily.   Yes [provider]  Vitamin D, Ergocalciferol, (DRISDOL) 50000 UNITS CAPS capsule Take 2,000 Units by mouth 2 (two) times a week.    Yes [provider]  vitamin E 100 UNIT capsule Take 250 Units by mouth daily.   Yes [provider]  pantoprazole (PROTONIX) 40 MG tablet Take 1 tablet (40 mg total) by mouth daily.  08/13/15 08/12/16  Nance Pear, MD    Physical Exam Vitals:  Vitals:   05/28/17 1433  BP: 106/62  Pulse: 66   Patient's last menstrual period was 05/27/2017.  General: NAD HEENT: normocephalic, anicteric Pulmonary: No increased work of breathing Neurologic: Grossly intact Psychiatric: mood appropriate, affect full  Female chaperone present for pelvic and breast  portions of the physical exam  Assessment: 41 y.o. G3P3 uterine fibroids, and menorrhagia  Plan: Problem List Items Addressed This Visit    None    Visit Diagnoses    Submucous leiomyoma of uterus    -  Primary     - Significant interval decrease in size of fibroid since uterine artery embolization which should make her uterus more amenable for laparoscopic hysterectomy.  THe patient is interested in pursuing hysterectomy given continued symptoms.  -Patient opts for definitive surgical management via hysterectomy. The risks of surgery were discussed in detail with the patient including but not limited to: bleeding which may require transfusion or reoperation; infection which may require antibiotics; injury to bowel, bladder, ureters or other surrounding organs (With a literature reported rate of urinary tract injury of 1% quoted); need for additional procedures including laparotomy; thromboembolic phenomenon, incisional problems and other postoperative/anesthesia complications.  Patient was also advised that recovery procedure generally involves an overnight stay; and the  expected recovery time after a hysterectomy being in the range of 6-8 weeks.  Likelihood of success in alleviating the patient's symptoms was discussed.  While definitive in regards to issues with menstural bleeding, pelvic pain if present preoperatively may continue and in fact worsen postoperatively.  She is aware that the procedure will render her unable to pursue childbearing in the future.   She was told that she will be contacted by our surgical  scheduler regarding the time and date of her surgery; routine preoperative instructions of having nothing to eat or drink after midnight on the day prior to surgery and also coming to the hospital 1.5 hours prior to her time of surgery were also emphasized.  She was told she may be called for a preoperative appointment about a week prior to surgery and will be given further preoperative instructions at that visit.  Routine postoperative instructions will be reviewed with the patient and her family in detail after surgery. Printed patient education handouts about the procedure was given to the patient to review at home.  - A total of 15 minutes were spent in face-to-face contact with the patient during this encounter with over half of that time devoted to counseling and coordination of care.

## 2017-05-29 ENCOUNTER — Telehealth: Payer: Self-pay | Admitting: Obstetrics and Gynecology

## 2017-05-29 NOTE — Telephone Encounter (Signed)
-----   Message from [name], MD sent at 05/28/2017  9:59 PM EDT ----- Regarding: Hysterectomy Surgery Date: within the next month  LOS: observation  Surgery Booking Request Patient Full Name: Haley Garza MRN: [id number]  DOB: 1976/06/05  Surgeon: [name], MD  Requested Surgery Date and Time: 1-4 weeks Primary Diagnosis and Code: Uterine fibroids Secondary Diagnosis and Code: Menorrhagia Surgical Procedure: TLH/BS and cystoscopy L&D Notification:N/A Admission Status: observation Length of Surgery: 2.5hrs Special Case Needs: none H&P:  (date) Phone Interview or Office Pre-Admit: phone Interpreter: none Language: English Medical Clearance: none Special Scheduling Instructions: none

## 2017-05-29 NOTE — Telephone Encounter (Signed)
Lmtrc

## 2017-05-30 NOTE — Telephone Encounter (Signed)
-----   Message from [name], MD sent at 05/28/2017  9:59 PM EDT ----- Regarding: Hysterectomy Surgery Date: within the next month  LOS: observation  Surgery Booking Request Patient Full Name: Haley Garza MRN: [id number]  DOB: [birth year]  Surgeon: [name], MD  Requested Surgery Date and Time: 1-4 weeks Primary Diagnosis and Code: Uterine fibroids Secondary Diagnosis and Code: Menorrhagia Surgical Procedure: TLH/BS and cystoscopy L&D Notification:N/A Admission Status: observation Length of Surgery: 2.5hrs Special Case Needs: none H&P:  (date) Phone Interview or Office Pre-Admit: phone Interpreter: none Language: English Medical Clearance: none Special Scheduling Instructions: none

## 2017-05-30 NOTE — Telephone Encounter (Signed)
Patient returned the call and is scheduled for H&P at [facility] on 06/12/17 @ 10:10am w/ Dr. Georgianne Fick with Pre-admit Testing phone interview following, and OR on 06/18/17. Patient confirmed BCBS only, and per patient Medicaid FPW cannot be switched to another form of Medicaid, she has aged out.

## 2017-06-12 ENCOUNTER — Encounter: Payer: BC Managed Care – PPO | Admitting: Obstetrics and Gynecology

## 2017-06-12 ENCOUNTER — Encounter: Payer: Self-pay | Admitting: Obstetrics and Gynecology

## 2017-06-12 ENCOUNTER — Ambulatory Visit (INDEPENDENT_AMBULATORY_CARE_PROVIDER_SITE_OTHER): Payer: BC Managed Care – PPO | Admitting: Obstetrics and Gynecology

## 2017-06-12 ENCOUNTER — Encounter
Admission: RE | Admit: 2017-06-12 | Discharge: 2017-06-12 | Disposition: A | Discharge status: Home / Self Care | Payer: BC Managed Care – PPO | Source: Ambulatory Visit | Attending: Obstetrics and Gynecology | Admitting: Obstetrics and Gynecology

## 2017-06-12 VITALS — BP 98/60 | HR 55 | Ht 67.0 in | Wt 145.0 lb

## 2017-06-12 DIAGNOSIS — Z818 Family history of other mental and behavioral disorders: Secondary | ICD-10-CM | POA: Insufficient documentation

## 2017-06-12 DIAGNOSIS — D649 Anemia, unspecified: Secondary | ICD-10-CM | POA: Insufficient documentation

## 2017-06-12 DIAGNOSIS — F329 Major depressive disorder, single episode, unspecified: Secondary | ICD-10-CM | POA: Insufficient documentation

## 2017-06-12 DIAGNOSIS — N939 Abnormal uterine and vaginal bleeding, unspecified: Secondary | ICD-10-CM

## 2017-06-12 DIAGNOSIS — N945 Secondary dysmenorrhea: Secondary | ICD-10-CM | POA: Insufficient documentation

## 2017-06-12 DIAGNOSIS — Z8249 Family history of ischemic heart disease and other diseases of the circulatory system: Secondary | ICD-10-CM | POA: Insufficient documentation

## 2017-06-12 DIAGNOSIS — Z87442 Personal history of urinary calculi: Secondary | ICD-10-CM | POA: Insufficient documentation

## 2017-06-12 DIAGNOSIS — Z79899 Other long term (current) drug therapy: Secondary | ICD-10-CM | POA: Insufficient documentation

## 2017-06-12 DIAGNOSIS — D250 Submucous leiomyoma of uterus: Secondary | ICD-10-CM

## 2017-06-12 DIAGNOSIS — F17210 Nicotine dependence, cigarettes, uncomplicated: Secondary | ICD-10-CM | POA: Insufficient documentation

## 2017-06-12 DIAGNOSIS — M1990 Unspecified osteoarthritis, unspecified site: Secondary | ICD-10-CM | POA: Insufficient documentation

## 2017-06-12 DIAGNOSIS — N920 Excessive and frequent menstruation with regular cycle: Secondary | ICD-10-CM | POA: Insufficient documentation

## 2017-06-12 DIAGNOSIS — Z888 Allergy status to other drugs, medicaments and biological substances status: Secondary | ICD-10-CM | POA: Insufficient documentation

## 2017-06-12 DIAGNOSIS — D259 Leiomyoma of uterus, unspecified: Secondary | ICD-10-CM | POA: Insufficient documentation

## 2017-06-12 DIAGNOSIS — D251 Intramural leiomyoma of uterus: Secondary | ICD-10-CM

## 2017-06-12 DIAGNOSIS — E669 Obesity, unspecified: Secondary | ICD-10-CM | POA: Insufficient documentation

## 2017-06-12 DIAGNOSIS — F419 Anxiety disorder, unspecified: Secondary | ICD-10-CM | POA: Insufficient documentation

## 2017-06-12 DIAGNOSIS — Z01812 Encounter for preprocedural laboratory examination: Secondary | ICD-10-CM | POA: Insufficient documentation

## 2017-06-12 DIAGNOSIS — Z9889 Other specified postprocedural states: Secondary | ICD-10-CM | POA: Insufficient documentation

## 2017-06-12 HISTORY — DX: Anemia, unspecified: D64.9

## 2017-06-12 HISTORY — DX: Personal history of urinary calculi: Z87.442

## 2017-06-12 HISTORY — DX: Unspecified osteoarthritis, unspecified site: M19.90

## 2017-06-12 NOTE — Progress Notes (Signed)
Obstetrics & Gynecology Surgery H&P    Chief Complaint: Scheduled Surgery   History of Present Illness: Patient is a 41 y.o. G3P3 presenting for scheduled laparoscopic hysterectomy, bilateral salpingectomy, and cystoscopy, for the treatment or further evaluation of menorrhagia and dysmenorrhea secondary to uterine fibroid.   Prior Treatments prior to proceeding with surgery include: [place] and attempts at hormonal management  Preoperative Pap: 02/04/15 NIL HPV negative Preoperative Endometrial biopsy: Not obtained Preoperative Ultrasound: CT July 2018 5cm fundal fibroid   Review of Systems:10 point review of systems  Past Medical History:  Past Medical History:  Diagnosis Date  . Anemia   . Anxiety   . Arthritis   . Depression   . History of kidney stones   . IFG (impaired fasting glucose)   . Obesity   . Tobacco abuse     Past Surgical History:  Past Surgical History:  Procedure Laterality Date  . BARIATRIC SURGERY    . CESAREAN SECTION     x 2   . INTRAUTERINE DEVICE (IUD) INSERTION     feb 2014  . KNEE ARTHROSCOPY Right   . TUBAL LIGATION    . UTERINE FIBROID EMBOLIZATION  01/09/2017   Triangular vascular    Family History:  Family History  Problem Relation Age of Onset  . Hypertension Mother   . Hyperlipidemia Mother   . Depression Mother   . Fibroids Mother     Social History:  Social History   Social History  . Marital status: Married    Spouse name: N/A  . Number of children: N/A  . Years of education: N/A   Occupational History  . Not on file.   Social History Main Topics  . Smoking status: Current Some Day Smoker    Packs/day: 0.25    Types: Cigarettes  . Smokeless tobacco: Never Used  . Alcohol use No  . Drug use: No  . Sexual activity: Not Currently    Birth control/ protection: None   Other Topics Concern  . Not on file   Social History Narrative  . No narrative on file    Allergies:  Allergies  Allergen Reactions    . Honey Bee Treatment [Bee Venom] Itching and Swelling  . Oxycodone-Acetaminophen Hives and Itching    Medications: Prior to Admission medications   Medication Sig Start Date End Date Taking? Authorizing Provider  acetaminophen-codeine (TYLENOL #3) 300-30 MG tablet Take 1 tablet by mouth every 6 (six) hours as needed for moderate pain. 05/12/17  Yes Paulette Blanch, MD  albuterol (PROVENTIL HFA;VENTOLIN HFA) 108 (90 BASE) MCG/ACT inhaler Inhale 2 puffs into the lungs every 6 (six) hours as needed for wheezing or shortness of breath.   Yes [provider]  Ascorbic Acid (VITAMIN C) 1000 MG tablet Take 1,000 mg by mouth daily. Chewable   Yes [provider]  b complex vitamins tablet Take 1 tablet by mouth daily.   Yes [provider]  Biotin 2500 MCG CAPS Take 2,500 mcg by mouth daily.   Yes [provider]  Cholecalciferol (VITAMIN D3) 5000 units CAPS Take 5,000 Units by mouth daily.   Yes [provider]  ferrous sulfate 325 (65 FE) MG tablet Take 65 mg by mouth daily with breakfast.   Yes [provider]  Multiple Vitamins-Minerals (MULTIVITAMIN PO) Take 1 tablet by mouth daily.   Yes [provider]  naproxen (NAPROSYN) 500 MG tablet Take 1 tablet (500 mg total) by mouth 2 (  two) times daily with a meal. 04/30/15  Yes Sable Feil, PA-C  vitamin A 8000 UNIT capsule Take 8,000 Units by mouth daily.   Yes [provider]  vitamin E 200 UNIT capsule Take 200 Units by mouth daily.   Yes [provider]  VITAMIN K PO Take 100 mcg by mouth daily.   Yes [provider]  pantoprazole (PROTONIX) 40 MG tablet Take 1 tablet (40 mg total) by mouth daily. Patient not taking: Reported on 06/10/2017 08/13/15 08/12/16  Nance Pear, MD    Physical Exam Vitals: Blood pressure 98/60, pulse (!) 55, height 5\' 7"  (1.702 m), weight 145 lb (65.8 kg), last menstrual period 05/27/2017. General: NAD HEENT: normocephalic,  anicteric Pulmonary: No increased work of breathing, CTAB Cardiovascular: RRR, distal pulses 2+ Abdomen: soft, non-tender, non-distended Genitourinary: deferred Extremities: no edema, erythema, or tenderness Neurologic: Grossly intact Psychiatric: mood appropriate, affect full  Imaging No results found.  Assessment: 41 y.o. G3P3 presenting for scheduled laparoscopic total hysterectomy, bilateral salpingectomy, and cystoscopy  Plan: 1) Patient opts for definitive surgical management via hysterectomy. The risks of surgery were discussed in detail with the patient including but not limited to: bleeding which may require transfusion or reoperation; infection which may require antibiotics; injury to bowel, bladder, ureters or other surrounding organs (With a literature reported rate of urinary tract injury of 1% quoted); need for additional procedures including laparotomy; thromboembolic phenomenon, incisional problems and other postoperative/anesthesia complications.  Patient was also advised that recovery procedure generally involves an overnight stay; and the  expected recovery time after a hysterectomy being in the range of 6-8 weeks.  Likelihood of success in alleviating the patient's symptoms was discussed.  While definitive in regards to issues with menstural bleeding, pelvic pain if present preoperatively may continue and in fact worsen postoperatively.  She is aware that the procedure will render her unable to pursue childbearing in the future.   She was told that she will be contacted by our surgical scheduler regarding the time and date of her surgery; routine preoperative instructions of having nothing to eat or drink after midnight on the day prior to surgery and also coming to the hospital 1.5 hours prior to her time of surgery were also emphasized.  She was told she may be called for a preoperative appointment about a week prior to surgery and will be given further preoperative instructions  at that visit.  Routine postoperative instructions will be reviewed with the patient and her family in detail after surgery. Printed patient education handouts about the procedure was given to the patient to review at home.   2) Routine postoperative instructions were reviewed with the patient and her family in detail today including the expected length of recovery and likely postoperative course.  The patient concurred with the proposed plan, giving informed written consent for the surgery today.  Patient instructed on the importance of being NPO after midnight prior to her procedure.  If warranted preoperative prophylactic antibiotics and SCDs ordered on call to the OR to meet SCIP guidelines and adhere to recommendation laid forth in [place] Number 104 May 2009  "Antibiotic Prophylaxis for Gynecologic Procedures".

## 2017-06-12 NOTE — Patient Instructions (Signed)
  Your procedure is scheduled on: June 18, 2017 [facility]) Report to Same Day Surgery 2nd floor medical mall [facility] Entrance-take elevator on left to 2nd floor.  Check in with surgery information desk.) To find out your arrival time please call [phone] between 1PM - 3PM on June 17, 2017 [facility])  Remember: Instructions that are not followed completely may result in serious medical risk, up to and including death, or upon the discretion of your surgeon and anesthesiologist your surgery may need to be rescheduled.    _x___ 1. Do not eat food or drink liquids after midnight. No gum chewing or hard candies    __x__ 2. No Alcohol for 24 hours before or after surgery.   __x__3. No Smoking for 24 prior to surgery.   ____  4. Bring all medications with you on the day of surgery if instructed.    __x__ 5. Notify your doctor if there is any change in your medical condition     (cold, fever, infections).     Do not wear jewelry, make-up, hairpins, clips or nail polish.  Do not wear lotions, powders, or perfumes.   Do not shave 48 hours prior to surgery. Men may shave face and neck.  Do not bring valuables to the hospital.    [facility] is not responsible for any belongings or valuables.               Contacts, dentures or bridgework may not be worn into surgery.  Leave your suitcase in the car. After surgery it may be brought to your room.  For patients admitted to the hospital, discharge time is determined by your  treatment team                     Patients discharged the day of surgery will not be allowed to drive home.  You will need someone to drive you home and stay with you the night of your procedure.    Please read over the following fact sheets that you were given:   [facility] Preparing for Surgery and or MRSA Information   ___ Take anti-hypertensive (unless it includes a diuretic), cardiac, seizure, asthma,     anti-reflux and psychiatric medicines. These  include:  1.   2.  3.  4.  5.  6.  ____Fleets enema or Magnesium Citrate as directed.   _x___ Use CHG Soap or sage wipes as directed on instruction sheet   __x__ Use inhalers on the day of surgery and bring to hospital day of surgery (USE ALBUTEROL [place] )  ____ Stop Metformin and Janumet 2 days prior to surgery.    ____ Take 1/2 of usual insulin dose the night before surgery and none on the morning  surgery  _x___ Follow recommendations from Cardiologist, Pulmonologist or PCP regarding          stopping Aspirin, Coumadin, Plavix ,Eliquis, Effient, or Pradaxa, and Pletal.  X____Stop Anti-inflammatories such as Advil, Aleve, Ibuprofen, Motrin, Naproxen, Naprosyn, Goodies powders or aspirin products. OK to take Tylenol or pain medication if needed   _x___ Stop supplements until after surgery.  But may continue Vitamin D, Vitamin B, and multivitamin (STOP VITAMIN A, C,E, K AND BIOTIN NOW )      ____ Bring C-Pap to the hospital.

## 2017-06-13 ENCOUNTER — Encounter
Admission: RE | Admit: 2017-06-13 | Discharge: 2017-06-13 | Disposition: A | Discharge status: Home / Self Care | Payer: BC Managed Care – PPO | Source: Ambulatory Visit | Attending: Obstetrics and Gynecology | Admitting: Obstetrics and Gynecology

## 2017-06-13 DIAGNOSIS — E669 Obesity, unspecified: Secondary | ICD-10-CM | POA: Diagnosis not present

## 2017-06-13 DIAGNOSIS — N945 Secondary dysmenorrhea: Secondary | ICD-10-CM | POA: Diagnosis not present

## 2017-06-13 DIAGNOSIS — N920 Excessive and frequent menstruation with regular cycle: Secondary | ICD-10-CM | POA: Diagnosis not present

## 2017-06-13 DIAGNOSIS — F17210 Nicotine dependence, cigarettes, uncomplicated: Secondary | ICD-10-CM | POA: Diagnosis not present

## 2017-06-13 DIAGNOSIS — F329 Major depressive disorder, single episode, unspecified: Secondary | ICD-10-CM | POA: Diagnosis not present

## 2017-06-13 DIAGNOSIS — Z9889 Other specified postprocedural states: Secondary | ICD-10-CM | POA: Diagnosis not present

## 2017-06-13 DIAGNOSIS — Z8249 Family history of ischemic heart disease and other diseases of the circulatory system: Secondary | ICD-10-CM | POA: Diagnosis not present

## 2017-06-13 DIAGNOSIS — Z01812 Encounter for preprocedural laboratory examination: Secondary | ICD-10-CM | POA: Diagnosis present

## 2017-06-13 DIAGNOSIS — Z888 Allergy status to other drugs, medicaments and biological substances status: Secondary | ICD-10-CM | POA: Diagnosis not present

## 2017-06-13 DIAGNOSIS — Z79899 Other long term (current) drug therapy: Secondary | ICD-10-CM | POA: Diagnosis not present

## 2017-06-13 DIAGNOSIS — D649 Anemia, unspecified: Secondary | ICD-10-CM | POA: Diagnosis not present

## 2017-06-13 DIAGNOSIS — Z87442 Personal history of urinary calculi: Secondary | ICD-10-CM | POA: Diagnosis not present

## 2017-06-13 DIAGNOSIS — D259 Leiomyoma of uterus, unspecified: Secondary | ICD-10-CM | POA: Diagnosis not present

## 2017-06-13 DIAGNOSIS — F419 Anxiety disorder, unspecified: Secondary | ICD-10-CM | POA: Diagnosis not present

## 2017-06-13 DIAGNOSIS — Z818 Family history of other mental and behavioral disorders: Secondary | ICD-10-CM | POA: Diagnosis not present

## 2017-06-13 DIAGNOSIS — M1990 Unspecified osteoarthritis, unspecified site: Secondary | ICD-10-CM | POA: Diagnosis not present

## 2017-06-13 LAB — TYPE AND SCREEN
ABO/RH(D): A POS
Antibody Screen: NEGATIVE

## 2017-06-13 LAB — BASIC METABOLIC PANEL
Anion gap: 7 (ref 5–15)
BUN: 12 mg/dL (ref 6–20)
CALCIUM: 8.7 mg/dL — AB (ref 8.9–10.3)
CO2: 25 mmol/L (ref 22–32)
CREATININE: 0.75 mg/dL (ref 0.44–1.00)
Chloride: 106 mmol/L (ref 101–111)
GFR calc Af Amer: >60 mL/min (ref >60)
Glucose, Bld: 92 mg/dL (ref 65–99)
Potassium: 3.8 mmol/L (ref 3.5–5.1)
Sodium: 138 mmol/L (ref 135–145)

## 2017-06-13 LAB — CBC
HCT: 32.1 % — ABNORMAL LOW (ref 35.0–47.0)
Hemoglobin: 10.4 g/dL — ABNORMAL LOW (ref 12.0–16.0)
MCH: 27.1 pg (ref 26.0–34.0)
MCHC: 32.5 g/dL (ref 32.0–36.0)
MCV: 83.3 fL (ref 80.0–100.0)
PLATELETS: 287 10*3/uL (ref 150–440)
RBC: 3.86 MIL/uL (ref 3.80–5.20)
RDW: 17.5 % — AB (ref 11.5–14.5)
WBC: 4.4 10*3/uL (ref 3.6–11.0)

## 2017-06-18 ENCOUNTER — Encounter
Admission: RE | Disposition: A | Discharge status: Home / Self Care | Payer: Self-pay | Source: Ambulatory Visit | Attending: Obstetrics and Gynecology

## 2017-06-18 ENCOUNTER — Observation Stay
Admission: RE | Admit: 2017-06-18 | Discharge: 2017-06-19 | Disposition: A | Discharge status: Home / Self Care | Payer: BC Managed Care – PPO | Source: Ambulatory Visit | Attending: Obstetrics and Gynecology | Admitting: Obstetrics and Gynecology

## 2017-06-18 ENCOUNTER — Ambulatory Visit: Payer: BC Managed Care – PPO | Admitting: Anesthesiology

## 2017-06-18 DIAGNOSIS — F17210 Nicotine dependence, cigarettes, uncomplicated: Secondary | ICD-10-CM | POA: Diagnosis not present

## 2017-06-18 DIAGNOSIS — D649 Anemia, unspecified: Secondary | ICD-10-CM | POA: Diagnosis not present

## 2017-06-18 DIAGNOSIS — Z791 Long term (current) use of non-steroidal anti-inflammatories (NSAID): Secondary | ICD-10-CM | POA: Diagnosis not present

## 2017-06-18 DIAGNOSIS — Z79899 Other long term (current) drug therapy: Secondary | ICD-10-CM | POA: Diagnosis not present

## 2017-06-18 DIAGNOSIS — J45909 Unspecified asthma, uncomplicated: Secondary | ICD-10-CM | POA: Diagnosis not present

## 2017-06-18 DIAGNOSIS — N939 Abnormal uterine and vaginal bleeding, unspecified: Secondary | ICD-10-CM

## 2017-06-18 DIAGNOSIS — Z842 Family history of other diseases of the genitourinary system: Secondary | ICD-10-CM | POA: Insufficient documentation

## 2017-06-18 DIAGNOSIS — N945 Secondary dysmenorrhea: Secondary | ICD-10-CM | POA: Insufficient documentation

## 2017-06-18 DIAGNOSIS — K660 Peritoneal adhesions (postprocedural) (postinfection): Secondary | ICD-10-CM | POA: Diagnosis not present

## 2017-06-18 DIAGNOSIS — D251 Intramural leiomyoma of uterus: Secondary | ICD-10-CM | POA: Diagnosis not present

## 2017-06-18 DIAGNOSIS — Z90710 Acquired absence of both cervix and uterus: Secondary | ICD-10-CM | POA: Diagnosis present

## 2017-06-18 DIAGNOSIS — N920 Excessive and frequent menstruation with regular cycle: Secondary | ICD-10-CM | POA: Insufficient documentation

## 2017-06-18 DIAGNOSIS — N72 Inflammatory disease of cervix uteri: Secondary | ICD-10-CM | POA: Insufficient documentation

## 2017-06-18 HISTORY — DX: Nausea with vomiting, unspecified: R11.2

## 2017-06-18 HISTORY — PX: LAPAROSCOPIC HYSTERECTOMY: SHX1926

## 2017-06-18 HISTORY — DX: Other specified postprocedural states: Z98.890

## 2017-06-18 HISTORY — PX: CYSTOSCOPY: SHX5120

## 2017-06-18 LAB — POCT PREGNANCY, URINE: Preg Test, Ur: NEGATIVE

## 2017-06-18 SURGERY — HYSTERECTOMY, TOTAL, LAPAROSCOPIC
Anesthesia: General

## 2017-06-18 MED ORDER — FENTANYL CITRATE (PF) 100 MCG/2ML IJ SOLN
INTRAMUSCULAR | Status: DC | PRN
  Administered 2017-06-18: 50 ug via INTRAVENOUS
  Administered 2017-06-18: 100 ug via INTRAVENOUS

## 2017-06-18 MED ORDER — PROPOFOL 10 MG/ML IV BOLUS
INTRAVENOUS | Status: AC
  Filled 2017-06-18: qty 20

## 2017-06-18 MED ORDER — SUCCINYLCHOLINE CHLORIDE 20 MG/ML IJ SOLN
INTRAMUSCULAR | Status: DC | PRN
Start: 2017-06-18 — End: 2017-06-18
  Administered 2017-06-18: 80 mg via INTRAVENOUS

## 2017-06-18 MED ORDER — ONDANSETRON HCL 4 MG/2ML IJ SOLN
4.0000 mg | Freq: Four times a day (QID) | INTRAMUSCULAR | Status: DC | PRN
  Administered 2017-06-18 (×2): 4 mg via INTRAVENOUS
  Filled 2017-06-18 (×2): qty 2

## 2017-06-18 MED ORDER — DEXTROSE-NACL 5-0.45 % IV SOLN
INTRAVENOUS | Status: DC
  Administered 2017-06-18: 20:00:00 via INTRAVENOUS

## 2017-06-18 MED ORDER — ONDANSETRON HCL 4 MG/2ML IJ SOLN: 4.0000 mg | Freq: Four times a day (QID) | INTRAMUSCULAR | Status: DC | PRN

## 2017-06-18 MED ORDER — ROCURONIUM BROMIDE 100 MG/10ML IV SOLN
INTRAVENOUS | Status: DC | PRN
  Administered 2017-06-18: 5 mg via INTRAVENOUS
  Administered 2017-06-18: 45 mg via INTRAVENOUS
  Administered 2017-06-18: 20 mg via INTRAVENOUS

## 2017-06-18 MED ORDER — PROPOFOL 10 MG/ML IV BOLUS
INTRAVENOUS | Status: DC | PRN
Start: 2017-06-18 — End: 2017-06-18
  Administered 2017-06-18: 140 mg via INTRAVENOUS

## 2017-06-18 MED ORDER — ONDANSETRON HCL 4 MG/2ML IJ SOLN: 4.0000 mg | Freq: Once | INTRAMUSCULAR | Status: DC | PRN

## 2017-06-18 MED ORDER — BUPIVACAINE HCL 0.5 % IJ SOLN
INTRAMUSCULAR | Status: DC | PRN
  Administered 2017-06-18: 26 mL

## 2017-06-18 MED ORDER — FLUORESCEIN SODIUM 10 % IV SOLN
INTRAVENOUS | Status: AC
  Filled 2017-06-18: qty 5

## 2017-06-18 MED ORDER — CEFAZOLIN SODIUM-DEXTROSE 2-4 GM/100ML-% IV SOLN
INTRAVENOUS | Status: AC
  Filled 2017-06-18: qty 100

## 2017-06-18 MED ORDER — DEXAMETHASONE SODIUM PHOSPHATE 10 MG/ML IJ SOLN
INTRAMUSCULAR | Status: DC | PRN
  Administered 2017-06-18: 10 mg via INTRAVENOUS

## 2017-06-18 MED ORDER — SUGAMMADEX SODIUM 200 MG/2ML IV SOLN
INTRAVENOUS | Status: DC | PRN
  Administered 2017-06-18: 140 mg via INTRAVENOUS

## 2017-06-18 MED ORDER — FENTANYL CITRATE (PF) 250 MCG/5ML IJ SOLN
INTRAMUSCULAR | Status: AC
  Filled 2017-06-18: qty 5

## 2017-06-18 MED ORDER — ALBUTEROL SULFATE HFA 108 (90 BASE) MCG/ACT IN AERS: 2.0000 | INHALATION_SPRAY | Freq: Four times a day (QID) | RESPIRATORY_TRACT | Status: DC | PRN

## 2017-06-18 MED ORDER — FAMOTIDINE 20 MG PO TABS
ORAL_TABLET | ORAL | Status: AC
  Administered 2017-06-18: 20 mg via ORAL
  Filled 2017-06-18: qty 1

## 2017-06-18 MED ORDER — KETOROLAC TROMETHAMINE 30 MG/ML IJ SOLN
30.0000 mg | Freq: Four times a day (QID) | INTRAMUSCULAR | Status: DC
  Administered 2017-06-18: 30 mg via INTRAVENOUS
  Filled 2017-06-18: qty 1

## 2017-06-18 MED ORDER — LIDOCAINE HCL (CARDIAC) 20 MG/ML IV SOLN
INTRAVENOUS | Status: DC | PRN
  Administered 2017-06-18: 50 mg via INTRAVENOUS

## 2017-06-18 MED ORDER — SUCCINYLCHOLINE CHLORIDE 20 MG/ML IJ SOLN
INTRAMUSCULAR | Status: AC
  Filled 2017-06-18: qty 1

## 2017-06-18 MED ORDER — DIPHENHYDRAMINE HCL 25 MG PO CAPS: 50.0000 mg | ORAL_CAPSULE | Freq: Four times a day (QID) | ORAL | Status: DC | PRN

## 2017-06-18 MED ORDER — CEFAZOLIN SODIUM-DEXTROSE 2-4 GM/100ML-% IV SOLN
2.0000 g | INTRAVENOUS | Status: AC
  Administered 2017-06-18: 2 g via INTRAVENOUS

## 2017-06-18 MED ORDER — HYDROMORPHONE HCL 2 MG PO TABS
2.0000 mg | ORAL_TABLET | ORAL | Status: DC | PRN
  Administered 2017-06-18 – 2017-06-19 (×8): 2 mg via ORAL
  Filled 2017-06-18 (×8): qty 1

## 2017-06-18 MED ORDER — KETOROLAC TROMETHAMINE 30 MG/ML IJ SOLN
30.0000 mg | Freq: Four times a day (QID) | INTRAMUSCULAR | Status: DC
  Administered 2017-06-18 – 2017-06-19 (×3): 30 mg via INTRAVENOUS
  Filled 2017-06-18 (×3): qty 1

## 2017-06-18 MED ORDER — LIDOCAINE HCL (PF) 2 % IJ SOLN
INTRAMUSCULAR | Status: AC
  Filled 2017-06-18: qty 2

## 2017-06-18 MED ORDER — KETOROLAC TROMETHAMINE 30 MG/ML IJ SOLN: 30.0000 mg | Freq: Four times a day (QID) | INTRAMUSCULAR | Status: DC

## 2017-06-18 MED ORDER — ROCURONIUM BROMIDE 50 MG/5ML IV SOLN
INTRAVENOUS | Status: AC
  Filled 2017-06-18: qty 1

## 2017-06-18 MED ORDER — PHENYLEPHRINE HCL 10 MG/ML IJ SOLN
INTRAMUSCULAR | Status: DC | PRN
  Administered 2017-06-18: 100 ug via INTRAVENOUS

## 2017-06-18 MED ORDER — DEXAMETHASONE SODIUM PHOSPHATE 10 MG/ML IJ SOLN
INTRAMUSCULAR | Status: AC
  Filled 2017-06-18: qty 1

## 2017-06-18 MED ORDER — BUPIVACAINE HCL (PF) 0.5 % IJ SOLN
INTRAMUSCULAR | Status: AC
  Filled 2017-06-18: qty 30

## 2017-06-18 MED ORDER — ALBUTEROL SULFATE (2.5 MG/3ML) 0.083% IN NEBU: 2.5000 mg | INHALATION_SOLUTION | Freq: Four times a day (QID) | RESPIRATORY_TRACT | Status: DC | PRN

## 2017-06-18 MED ORDER — MIDAZOLAM HCL 2 MG/2ML IJ SOLN
INTRAMUSCULAR | Status: DC | PRN
  Administered 2017-06-18: 2 mg via INTRAVENOUS

## 2017-06-18 MED ORDER — MORPHINE SULFATE (PF) 2 MG/ML IV SOLN
1.0000 mg | INTRAVENOUS | Status: DC | PRN
  Administered 2017-06-18 (×2): 2 mg via INTRAVENOUS
  Filled 2017-06-18 (×2): qty 1

## 2017-06-18 MED ORDER — FAMOTIDINE 20 MG PO TABS
20.0000 mg | ORAL_TABLET | Freq: Once | ORAL | Status: AC
  Administered 2017-06-18: 20 mg via ORAL

## 2017-06-18 MED ORDER — FENTANYL CITRATE (PF) 100 MCG/2ML IJ SOLN
INTRAMUSCULAR | Status: AC
Start: 2017-06-18 — End: 2017-06-19
  Filled 2017-06-18: qty 2

## 2017-06-18 MED ORDER — ONDANSETRON HCL 4 MG/2ML IJ SOLN
INTRAMUSCULAR | Status: AC
  Filled 2017-06-18: qty 2

## 2017-06-18 MED ORDER — LACTATED RINGERS IV SOLN
INTRAVENOUS | Status: DC
  Administered 2017-06-18 (×2): via INTRAVENOUS
  Administered 2017-06-18: 1000 mL via INTRAVENOUS

## 2017-06-18 MED ORDER — SUGAMMADEX SODIUM 200 MG/2ML IV SOLN
INTRAVENOUS | Status: AC
  Filled 2017-06-18: qty 2

## 2017-06-18 MED ORDER — SODIUM CHLORIDE 0.9 % IV SOLN
INTRAVENOUS | Status: DC | PRN
  Administered 2017-06-18: 50 ug/min via INTRAVENOUS

## 2017-06-18 MED ORDER — MENTHOL 3 MG MT LOZG: 1.0000 | LOZENGE | OROMUCOSAL | Status: DC | PRN

## 2017-06-18 MED ORDER — ONDANSETRON HCL 4 MG/2ML IJ SOLN
INTRAMUSCULAR | Status: DC | PRN
  Administered 2017-06-18: 4 mg via INTRAVENOUS

## 2017-06-18 MED ORDER — FENTANYL CITRATE (PF) 100 MCG/2ML IJ SOLN
25.0000 ug | INTRAMUSCULAR | Status: DC | PRN
  Administered 2017-06-18 (×2): 25 ug via INTRAVENOUS

## 2017-06-18 MED ORDER — MIDAZOLAM HCL 2 MG/2ML IJ SOLN
INTRAMUSCULAR | Status: AC
  Filled 2017-06-18: qty 2

## 2017-06-18 SURGICAL SUPPLY — 53 items
APPLICATOR ARISTA FLEXITIP XL (MISCELLANEOUS) ×3 IMPLANT
BAG URO DRAIN 2000ML W/SPOUT (MISCELLANEOUS) IMPLANT
BLADE SURG SZ11 CARB STEEL (BLADE) ×3 IMPLANT
CANISTER SUCT 1200ML W/VALVE (MISCELLANEOUS) ×3 IMPLANT
CATH FOLEY 2WAY  5CC 16FR (CATHETERS) ×2
CATH ROBINSON RED A/P 16FR (CATHETERS) ×3 IMPLANT
CATH URTH 16FR FL 2W BLN LF (CATHETERS) ×1 IMPLANT
CHLORAPREP W/TINT 26ML (MISCELLANEOUS) ×3 IMPLANT
COUNTER NEEDLE 20/40 LG (NEEDLE) ×3 IMPLANT
DEFOGGER SCOPE WARMER CLEARIFY (MISCELLANEOUS) ×3 IMPLANT
DERMABOND ADVANCED (GAUZE/BANDAGES/DRESSINGS) ×2
DERMABOND ADVANCED .7 DNX12 (GAUZE/BANDAGES/DRESSINGS) ×1 IMPLANT
DRAPE UNDER BUTTOCK W/FLU (DRAPES) ×3 IMPLANT
GLOVE BIO SURGEON STRL SZ7 (GLOVE) ×12 IMPLANT
GLOVE INDICATOR 7.5 STRL GRN (GLOVE) ×3 IMPLANT
GOWN STRL REUS W/ TWL LRG LVL3 (GOWN DISPOSABLE) ×2 IMPLANT
GOWN STRL REUS W/ TWL XL LVL3 (GOWN DISPOSABLE) ×1 IMPLANT
GOWN STRL REUS W/TWL LRG LVL3 (GOWN DISPOSABLE) ×4
GOWN STRL REUS W/TWL XL LVL3 (GOWN DISPOSABLE) ×2
HEMOSTAT ARISTA ABSORB 1G (MISCELLANEOUS) ×3 IMPLANT
IRRIGATION STRYKERFLOW (MISCELLANEOUS) ×1 IMPLANT
IRRIGATOR STRYKERFLOW (MISCELLANEOUS) ×3
IV LACTATED RINGERS 1000ML (IV SOLUTION) ×3 IMPLANT
IV NS 1000ML (IV SOLUTION) ×2
IV NS 1000ML BAXH (IV SOLUTION) ×1 IMPLANT
KIT PINK PAD W/HEAD ARE REST (MISCELLANEOUS) ×3
KIT PINK PAD W/HEAD ARM REST (MISCELLANEOUS) ×1 IMPLANT
LABEL OR SOLS (LABEL) ×3 IMPLANT
MANIPULATOR VCARE LG CRV RETR (MISCELLANEOUS) IMPLANT
MANIPULATOR VCARE SML CRV RETR (MISCELLANEOUS) ×3 IMPLANT
MANIPULATOR VCARE STD CRV RETR (MISCELLANEOUS) IMPLANT
NS IRRIG 1000ML POUR BTL (IV SOLUTION) ×3 IMPLANT
NS IRRIG 500ML POUR BTL (IV SOLUTION) ×3 IMPLANT
OCCLUDER COLPOPNEUMO (BALLOONS) IMPLANT
PACK GYN LAPAROSCOPIC (MISCELLANEOUS) ×3 IMPLANT
PAD OB MATERNITY 4.3X12.25 (PERSONAL CARE ITEMS) ×3 IMPLANT
PAD PREP 24X41 OB/GYN DISP (PERSONAL CARE ITEMS) ×3 IMPLANT
SET CYSTO W/LG BORE CLAMP LF (SET/KITS/TRAYS/PACK) ×3 IMPLANT
SHEARS HARMONIC ACE PLUS 36CM (ENDOMECHANICALS) ×3 IMPLANT
SLEEVE ENDOPATH XCEL 5M (ENDOMECHANICALS) ×9 IMPLANT
SPONGE LAP 18X18 5 PK (GAUZE/BANDAGES/DRESSINGS) IMPLANT
SPONGE XRAY 4X4 16PLY STRL (MISCELLANEOUS) IMPLANT
STRAP SAFETY BODY (MISCELLANEOUS) ×3 IMPLANT
SURGILUBE 2OZ TUBE FLIPTOP (MISCELLANEOUS) ×3 IMPLANT
SUT MAXON ABS #0 GS21 30IN (SUTURE) ×6 IMPLANT
SUT VIC AB 0 CT1 27 (SUTURE) ×2
SUT VIC AB 0 CT1 27XCR 8 STRN (SUTURE) ×1 IMPLANT
SUT VIC AB 2-0 UR6 27 (SUTURE) IMPLANT
SUT VIC AB 4-0 FS2 27 (SUTURE) ×3 IMPLANT
SYR 50ML LL SCALE MARK (SYRINGE) ×3 IMPLANT
SYRINGE 10CC LL (SYRINGE) ×3 IMPLANT
TROCAR XCEL NON-BLD 5MMX100MML (ENDOMECHANICALS) ×3 IMPLANT
TUBING INSUF HEATED (TUBING) ×3 IMPLANT

## 2017-06-18 NOTE — Anesthesia Post-op Follow-up Note (Signed)
Anesthesia QCDR form completed.        

## 2017-06-18 NOTE — Op Note (Signed)
Preoperative Diagnosis: 1) 41 y.o. with abnormal uterine bleeding 2) 5 cm fundal fibroid  Postoperative Diagnosis: 1) 41 y.o. with abnormal uterine bleeding 2) 5 cm fundal fibroid 3) Dense adhesive disease of the uterus and bladder to the anterior abdominal wall and and right pelvic sidewall  Operation Performed: Total laparoscopic hysterectomy, lysis of adhesions >45 minutes, and cystoscopy  Indication:  Abnormal uterine bleeding and anemia, failed uterine artery embolization but with interval decrease in size of fundal fibroid to 5cm from 11cm.  Surgeon: [name], MD  Assistant: Barnett Applebaum, MD  Anesthesia: General  Preoperative Antibiotics: none  Estimated Blood Loss: 256mL  IV Fluids: 1L  Urine Output:: 670mL  Drains or Tubes: none  Implants: none  Specimens Removed: Uterus (partially morcellated), and cervix  Complications: none  Intraoperative Findings: The uterus was adherend from the level of the umbilicus down toward the bladder and pelvic sidewall.  The right dome of the liver and a portion of small bowl were tethered to the anterior abdominal will in the right upper quadrant.  The appendix appeared normal.  Cystoscopy at the conclusion of the case revealed an intact bladder dome and bilateral efflux from both ureters.    Patient Condition: stable  Procedure in Detail:  Patient was taken to the operating room where she was administered general anesthesia.  She was positioned in the dorsal lithotomy position utilizing Allen stirups, prepped and draped in the usual sterile fashion.  Prior to proceeding with procedure a time out was performed.  Attention was turned to the patient's pelvis.  An indwelling foley catheter was placed to decompress the patient's bladder.  An operative speculum was placed to allow visualization of the cervix.  The cervix was noted to be pulled cephalad and extremely anterior. The anterior lip of the cervix was grasped with a single  tooth tenaculum, and a small V-care uterine manipulator was placed to allow manipulation of the uterus.  The operative speculum and single tooth tenaculum were then removed.  Attention was turned to the patient's abdomen.  The umbilicus was infiltrated with 1% Sensorcaine, before making a stab incision using an 11 blade scalpel.  A 29mm Excel trocar was then used to gain direct entry into the peritoneal cavity utilizing the camera to visualize progress of the trocar during placement.  Once peritoneal entry had been achieved, insufflation was started and pneumoperitoneum established at a pressure of 10mmHg.    Visualization was impeded by the uterus and apparent adhesions.  A 16mm excel trocar was placed at Palmer's point under direct visualization.  One left and one right lower quadrant site were then injected with 1% Sensorcaine and a stab incision was made using an 11 blade scalpel.  Two additional 70mm Excel trocars were placed through these incisions under direct visualization. General inspection of the abdomen revealed the above noted findings.  The Fundal adhesions were taken down, however given the thick nature of the adhesions no clear plane was readily identifiable.  The left cornua was identified.  The utero ovarian ligament was identified ligated and transected using the Harmonic scalpel. The round ligament was then likewise ligated and transected.  The anterior leaf of the broad ligament was dissected down to the level of the internal cervical os and a bladder flap was started.  The posterior leaf of the broad ligament was dissected down to the utero-sacral ligament.  The uterine artery was skeletonized before being ligated and transected using the Harmonic scalpel with cephelad pressure applied to the V-care  device to assure lateralization of the ureter.  A bite was then taken with Harmonic medial to transected portio of uterine artery to further lateralize the ureter and vessel off the V-care cup.  The  anterior bladder flap was further developed and this allowed the anterior adhesion to be dissected of the uterus working caudally to cephalad.  The patient left utero ovarian and round ligaments were then disected.  Serial bites were medial to prior bites hugging the uterus until the V-care cup was reached.  The bladder flap was completed and the bladder mobilized off the V-care cup.  An anterior colpotomy was scores and carried around in a clockwise fashion to free the specimen, which was then removed vaginally.  Inspection revealed all pedicles to be hemostatic before proceed with vaginal closure.  Attention was turned to the patient pelvis.  An operative speculum was placed and the anterior and posterior portion of the vaginal cuff were tagged with long Alice clamps.  The cuff was closed using 0 Maxon using interrupted figure of 8 stitches.  The cuff was hemsotatic at the conclusion of closure without visible or palpable defects.  The indwelling foley catheter was removed.  Cystoscopy was performed noting and intact bladder dome as well as brisk efflux of urine from the left ureteral orifice. Fluorescein was administered after which good efflux was noted from the right ureteral orifice. The cystoscopy was removed and the indwelling foley catheter was replaced.  Pneumoperitoneum was re-established, the pelvis was irrigated and all pedicles were once again inspected and noted to be hemostatic.  The cuff appeared well closed and free of bowl or other tissue that may have inadvertently been incorporated into the vaginal closure.  No fluorescein was noted in the pelvis.  Additional hemostatic agents were (1g [place]) applied to all pedicles.      Pneumoperitoneum was evacuated and trocars were removed.  All trocar sites were then dressed with surgical skin glue.  Sponge needle and instrument counts were correct time two.  The patient tolerated the procedure well and was taken to the recovery room in stable  condition.

## 2017-06-18 NOTE — Anesthesia Preprocedure Evaluation (Signed)
Anesthesia Evaluation  Patient identified by MRN, date of birth, ID band Patient awake    Reviewed: Allergy & Precautions, NPO status , Patient's Chart, lab work & pertinent test results  History of Anesthesia Complications (+) PONV and history of anesthetic complications  Airway Mallampati: II       Dental   Pulmonary asthma , Current Smoker,           Cardiovascular (-) hypertension(-) Past MI and (-) CHF (-) dysrhythmias (-) Valvular Problems/Murmurs     Neuro/Psych Anxiety Depression    GI/Hepatic Neg liver ROS, neg GERD  ,  Endo/Other  negative endocrine ROS  Renal/GU negative Renal ROS     Musculoskeletal   Abdominal   Peds  Hematology  (+) anemia ,   Anesthesia Other Findings   Reproductive/Obstetrics                             Anesthesia Physical Anesthesia Plan  ASA: III  Anesthesia Plan: General   Post-op Pain Management:    Induction:   PONV Risk Score and Plan:   Airway Management Planned:   Additional Equipment:   Intra-op Plan:   Post-operative Plan:   Informed Consent: I have reviewed the patients History and Physical, chart, labs and discussed the procedure including the risks, benefits and alternatives for the proposed anesthesia with the patient or authorized representative who has indicated his/her understanding and acceptance.     Plan Discussed with:   Anesthesia Plan Comments:         Anesthesia Quick Evaluation

## 2017-06-18 NOTE — Progress Notes (Signed)
15 minute call to floor. 

## 2017-06-18 NOTE — Brief Op Note (Signed)
06/18/2017  12:11 PM  PATIENT:  Haley Garza  41 y.o. female  PRE-OPERATIVE DIAGNOSIS:  uterine fibroids,menorrhagia  POST-OPERATIVE DIAGNOSIS:  same  PROCEDURE:  Procedure(s): HYSTERECTOMY TOTAL LAPAROSCOPIC (N/A) CYSTOSCOPY (N/A)  Lysis of adhesions  SURGEON:  Surgeon(s) and Role:    [name], MD - Primary  PHYSICIAN ASSISTANT:   ASSISTANTS: Barnett Applebaum, MD   ANESTHESIA:   general  EBL:  Total I/O In: 1000 [I.V.:1000] Out: 800 [Urine:600; Blood:200]  BLOOD ADMINISTERED:none  DRAINS: none and Urinary Catheter (Foley)   LOCAL MEDICATIONS USED:  MARCAINE     SPECIMEN:  Source of Specimen:  uterus and cervix  DISPOSITION OF SPECIMEN:  PATHOLOGY  COUNTS:  YES  TOURNIQUET:  * No tourniquets in log *  DICTATION: .Note written in EPIC  PLAN OF CARE: Admit for overnight observation  PATIENT DISPOSITION:  PACU - hemodynamically stable.   Delay start of Pharmacological VTE agent (>24hrs) due to surgical blood loss or risk of bleeding: yes

## 2017-06-18 NOTE — Anesthesia Procedure Notes (Signed)
Procedure Name: Intubation Performed by: Silvana Newness Pre-anesthesia Checklist: Patient identified, Patient being monitored, Timeout performed, Emergency Drugs available and Suction available Patient Re-evaluated:Patient Re-evaluated prior to induction Oxygen Delivery Method: Circle system utilized Preoxygenation: Pre-oxygenation with 100% oxygen Induction Type: IV induction Ventilation: Mask ventilation without difficulty Laryngoscope Size: Mac and 3 Grade View: Grade II Tube type: Oral Tube size: 7.0 mm Number of attempts: 1 Airway Equipment and Method: Stylet Placement Confirmation: ETT inserted through vocal cords under direct vision,  positive ETCO2 and breath sounds checked- equal and bilateral Secured at: 22 cm Tube secured with: Tape Dental Injury: Teeth and Oropharynx as per pre-operative assessment

## 2017-06-18 NOTE — Anesthesia Postprocedure Evaluation (Signed)
Anesthesia Post Note  Patient: Haley Garza  Procedure(s) Performed: Procedure(s) (LRB): HYSTERECTOMY TOTAL LAPAROSCOPIC (N/A) CYSTOSCOPY (N/A)  Patient location during evaluation: PACU Anesthesia Type: General Level of consciousness: awake and alert Pain management: pain level controlled Vital Signs Assessment: post-procedure vital signs reviewed and stable Respiratory status: spontaneous breathing, nonlabored ventilation, respiratory function stable and patient connected to nasal cannula oxygen Cardiovascular status: blood pressure returned to baseline and stable Postop Assessment: no signs of nausea or vomiting Anesthetic complications: no     Last Vitals:  Vitals:   06/18/17 1300 06/18/17 1322  BP:  (!) 91/47  Pulse: (!) 51 (!) 55  Resp: 14 16  Temp: 36.4 C 36.4 C  SpO2: 100% 100%    Last Pain:  Vitals:   06/18/17 1322  TempSrc: Oral  PainSc:                  Martha Clan

## 2017-06-18 NOTE — Transfer of Care (Signed)
Immediate Anesthesia Transfer of Care Note  Patient: Haley Garza  Procedure(s) Performed: Procedure(s): HYSTERECTOMY TOTAL LAPAROSCOPIC (N/A) CYSTOSCOPY (N/A)  Patient Location: PACU  Anesthesia Type:General  Level of Consciousness: sedated  Airway & Oxygen Therapy: Patient Spontanous Breathing and Patient connected to face mask oxygen  Post-op Assessment: Report given to RN and Post -op Vital signs reviewed and stable  Post vital signs: Reviewed and stable  Last Vitals:  Vitals:   06/18/17 0812 06/18/17 1225  BP: 107/69 (!) 102/57  Pulse: (!) 48 67  Resp: 14 12  Temp: 36.6 C (!) 36.1 C  SpO2: 100% 100%    Last Pain:  Vitals:   06/18/17 1225  TempSrc: Temporal         Complications: No apparent anesthesia complications

## 2017-06-18 NOTE — H&P (Signed)
Date of Initial H&P: 06/12/2017  History reviewed, patient examined, no change in status, stable for surgery.

## 2017-06-19 ENCOUNTER — Encounter: Payer: Self-pay | Admitting: Obstetrics and Gynecology

## 2017-06-19 DIAGNOSIS — N939 Abnormal uterine and vaginal bleeding, unspecified: Secondary | ICD-10-CM | POA: Diagnosis not present

## 2017-06-19 LAB — BASIC METABOLIC PANEL
Anion gap: 6 (ref 5–15)
BUN: 8 mg/dL (ref 6–20)
CO2: 24 mmol/L (ref 22–32)
Calcium: 8.2 mg/dL — ABNORMAL LOW (ref 8.9–10.3)
Chloride: 106 mmol/L (ref 101–111)
Creatinine, Ser: 0.83 mg/dL (ref 0.44–1.00)
GFR calc Af Amer: >60 mL/min (ref >60)
GFR calc non Af Amer: >60 mL/min (ref >60)
Glucose, Bld: 98 mg/dL (ref 65–99)
Potassium: 3.8 mmol/L (ref 3.5–5.1)
SODIUM: 136 mmol/L (ref 135–145)

## 2017-06-19 LAB — CBC
HCT: 26.2 % — ABNORMAL LOW (ref 35.0–47.0)
Hemoglobin: 8.5 g/dL — ABNORMAL LOW (ref 12.0–16.0)
MCH: 26.7 pg (ref 26.0–34.0)
MCHC: 32.4 g/dL (ref 32.0–36.0)
MCV: 82.5 fL (ref 80.0–100.0)
PLATELETS: 226 10*3/uL (ref 150–440)
RBC: 3.17 MIL/uL — ABNORMAL LOW (ref 3.80–5.20)
RDW: 17.4 % — ABNORMAL HIGH (ref 11.5–14.5)
WBC: 9.7 10*3/uL (ref 3.6–11.0)

## 2017-06-19 MED ORDER — IBUPROFEN 600 MG PO TABS: 600.0000 mg | ORAL_TABLET | Freq: Four times a day (QID) | ORAL | 3 refills | Status: DC | PRN

## 2017-06-19 MED ORDER — PREMIER PROTEIN SHAKE
2.0000 [oz_av] | Freq: Four times a day (QID) | ORAL | Status: DC
  Administered 2017-06-19: 2 [oz_av] via ORAL

## 2017-06-19 MED ORDER — HYDROMORPHONE HCL 2 MG PO TABS: 2.0000 mg | ORAL_TABLET | ORAL | 0 refills | Status: DC | PRN

## 2017-06-19 NOTE — Discharge Summary (Addendum)
Physician Discharge Summary  Patient ID: Haley Garza MRN: [id number] DOB/AGE: November 14, 1975 41 y.o.  Admit date: 06/18/2017 Discharge date: 06/19/2017  Admission Diagnoses:  Discharge Diagnoses:  Active Problems:   S/P laparoscopic hysterectomy   Discharged Condition: stable  Hospital Course: Patient underwent planned hysterectomy, lysis of adhesions, and cystoscopy for uterine fibroids and menorrhagia.  Patient's hospital course was uncomplicated.  She left the morning of postoperative day 1 meeting all postoperative goals including voiding, ambulating, reporting good pain control, tolerating po, and hemodynamically stable.  Labs were checked with an appropriate drop in H&H for 259m blood loss intraoperatively and stable BUN/Cr.  Consults: None  Significant Diagnostic Studies:  Results for orders placed or performed during the hospital encounter of 06/18/17 (from the past 72 hour(s))  Pregnancy, urine POC     Status: None   Collection Time: 06/18/17  8:20 AM  Result Value Ref Range   Preg Test, Ur NEGATIVE NEGATIVE    Comment:        THE SENSITIVITY OF THIS METHODOLOGY IS >24 mIU/mL   CBC     Status: Abnormal   Collection Time: 06/19/17  6:23 AM  Result Value Ref Range   WBC 9.7 3.6 - 11.0 K/uL   RBC 3.17 (L) 3.80 - 5.20 MIL/uL   Hemoglobin 8.5 (L) 12.0 - 16.0 g/dL   HCT 26.2 (L) 35.0 - 47.0 %   MCV 82.5 80.0 - 100.0 fL   MCH 26.7 26.0 - 34.0 pg   MCHC 32.4 32.0 - 36.0 g/dL   RDW 17.4 (H) 11.5 - 14.5 %   Platelets 226 150 - 440 K/uL  Basic metabolic panel     Status: Abnormal   Collection Time: 06/19/17  6:23 AM  Result Value Ref Range   Sodium 136 135 - 145 mmol/L   Potassium 3.8 3.5 - 5.1 mmol/L   Chloride 106 101 - 111 mmol/L   CO2 24 22 - 32 mmol/L   Glucose, Bld 98 65 - 99 mg/dL   BUN 8 6 - 20 mg/dL   Creatinine, Ser 0.83 0.44 - 1.00 mg/dL   Calcium 8.2 (L) 8.9 - 10.3 mg/dL   GFR calc non Af Amer >60 >60 mL/min   GFR calc Af Amer >60 >60 mL/min    Comment:  (NOTE) The eGFR has been calculated using the CKD EPI equation. This calculation has not been validated in all clinical situations. eGFR's persistently <60 mL/min signify possible Chronic Kidney Disease.    Anion gap 6 5 - 15    Treatments: IV hydration and surgery: TLH, lysis of adhesions, cystoscopy  Discharge Exam: Blood pressure (!) 92/46, pulse (!) 56, temperature 98 F (36.7 C), temperature source Oral, resp. rate 16, height '5\' 7"'$  (1.702 m), weight 145 lb (65.8 kg), last menstrual period 05/27/2017, SpO2 100 %. General appearance: alert, appears stated age and no distress Resp: clear to auscultation bilaterally Cardio: regular rate and rhythm, S1, S2 normal, no murmur, click, rub or gallop GI: soft, non-tender; bowel sounds normal; no masses,  no organomegaly Extremities: extremities normal, atraumatic, no cyanosis or edema Incision/Wound: D/C/I  Disposition: 01-Home or Self Care  Discharge Instructions    Call MD for:    Complete by:  As directed    Heavy vaginal bleeding greater than 1 pad an hour   Call MD for:  difficulty breathing, headache or visual disturbances    Complete by:  As directed    Call MD for:  extreme fatigue    Complete by:  As directed    Call MD for:  hives    Complete by:  As directed    Call MD for:  persistant dizziness or light-headedness    Complete by:  As directed    Call MD for:  persistant nausea and vomiting    Complete by:  As directed    Call MD for:  redness, tenderness, or signs of infection (pain, swelling, redness, odor or green/yellow discharge around incision site)    Complete by:  As directed    Call MD for:  severe uncontrolled pain    Complete by:  As directed    Call MD for:  temperature >100.4    Complete by:  As directed    Diet general    Complete by:  As directed    Discharge wound care:    Complete by:  As directed    You may apply a light dressing for minor discharge from the incision or to keep waistbands of  clothing from rubbing.  You may also have been discharge with a clear dressing in which case this will be removed at your postoperative clinic visit.  You may shower, use soap on your incision.  Avoiding baths or soaking the incision in the first 6 weeks following your surgery..   Driving restriction    Complete by:  As directed    Avoid driving for at least 2 weeks or while taking prescription narcotics.   Lifting restrictions    Complete by:  As directed    Weight restriction of 10lbs for 6 weeks.   Sexual acrtivity    Complete by:  As directed    No intercourse, tampons, or anything vaginally for 6 weeks     Allergies as of 06/19/2017      Reactions   Honey Bee Treatment [bee Venom] Itching, Swelling   Oxycodone-acetaminophen Hives, Itching      Medication List    STOP taking these medications   acetaminophen-codeine 300-30 MG tablet Commonly known as:  TYLENOL #3   naproxen 500 MG tablet Commonly known as:  NAPROSYN     TAKE these medications   albuterol 108 (90 Base) MCG/ACT inhaler Commonly known as:  PROVENTIL HFA;VENTOLIN HFA Inhale 2 puffs into the lungs every 6 (six) hours as needed for wheezing or shortness of breath.   b complex vitamins tablet Take 1 tablet by mouth daily.   Biotin 2500 MCG Caps Take 2,500 mcg by mouth daily.   ferrous sulfate 325 (65 FE) MG tablet Take 65 mg by mouth daily with breakfast.   HYDROmorphone 2 MG tablet Commonly known as:  DILAUDID Take 1 tablet (2 mg total) by mouth every 3 (three) hours as needed for moderate pain or severe pain.   MULTIVITAMIN PO Take 1 tablet by mouth daily.   pantoprazole 40 MG tablet Commonly known as:  PROTONIX Take 1 tablet (40 mg total) by mouth daily.   vitamin A 8000 UNIT capsule Take 8,000 Units by mouth daily.   vitamin C 1000 MG tablet Take 1,000 mg by mouth daily. Chewable   Vitamin D3 5000 units Caps Take 5,000 Units by mouth daily.   vitamin E 200 UNIT capsule Take 200 Units by  mouth daily.   VITAMIN K PO Take 100 mcg by mouth daily.            Discharge Care Instructions        Start     Ordered   06/19/17 0000  Call MD for:  Comments:  Heavy vaginal bleeding greater than 1 pad an hour   06/19/17 0823   06/19/17 0000  Call MD for:  temperature >100.4     06/19/17 0823   06/19/17 0000  Call MD for:  persistant nausea and vomiting     06/19/17 0823   06/19/17 0000  Call MD for:  severe uncontrolled pain     06/19/17 0823   06/19/17 0000  Call MD for:  redness, tenderness, or signs of infection (pain, swelling, redness, odor or green/yellow discharge around incision site)     06/19/17 0823   06/19/17 0000  Call MD for:  difficulty breathing, headache or visual disturbances     06/19/17 0823   06/19/17 0000  Call MD for:  hives     06/19/17 0823   06/19/17 0000  Call MD for:  persistant dizziness or light-headedness     06/19/17 0823   06/19/17 0000  Call MD for:  extreme fatigue     06/19/17 0823   06/19/17 0000  Lifting restrictions    Comments:  Weight restriction of 10lbs for 6 weeks.   06/19/17 0823   06/19/17 0000  Driving restriction    Comments:  Avoid driving for at least 2 weeks or while taking prescription narcotics.   06/19/17 0823   06/19/17 0000  Sexual acrtivity    Comments:  No intercourse, tampons, or anything vaginally for 6 weeks   06/19/17 6144   06/19/17 0000  Discharge wound care:    Comments:  You may apply a light dressing for minor discharge from the incision or to keep waistbands of clothing from rubbing.  You may also have been discharge with a clear dressing in which case this will be removed at your postoperative clinic visit.  You may shower, use soap on your incision.  Avoiding baths or soaking the incision in the first 6 weeks following your surgery.Marland Kitchen   06/19/17 0823   06/19/17 0000  Diet general     06/19/17 0823   06/19/17 0000  HYDROmorphone (DILAUDID) 2 MG tablet  Every  3 hours PRN     06/19/17 0830        Signed: [name] 06/19/2017, 8:30 AM

## 2017-06-19 NOTE — Progress Notes (Signed)
  June 19, 2017  Patient: Haley Garza  Date of Birth: [birth year]  Date of Visit: 05/30/2017    To Whom It May Concern:  [name] was seen and treated in Hospital on 05/30/2017. Please excuse Gemma Payor from work as she was her support person .  Sincerely,  Orie Rout, RN

## 2017-06-19 NOTE — Progress Notes (Signed)
Pt discharged home.  Discharge instructions, prescriptions and follow up appointment given to and reviewed with pt.  Pt verbalized understanding.  Escorted by auxillary. 

## 2017-06-20 LAB — SURGICAL PATHOLOGY

## 2017-06-21 ENCOUNTER — Encounter: Payer: Self-pay | Admitting: Obstetrics and Gynecology

## 2017-06-21 ENCOUNTER — Other Ambulatory Visit: Payer: Self-pay | Admitting: Obstetrics and Gynecology

## 2017-06-21 MED ORDER — ACETAMINOPHEN-CODEINE #3 300-30 MG PO TABS: 1.0000 | ORAL_TABLET | ORAL | 0 refills | Status: DC | PRN

## 2017-06-24 ENCOUNTER — Other Ambulatory Visit: Payer: Self-pay | Admitting: Advanced Practice Midwife

## 2017-06-24 ENCOUNTER — Other Ambulatory Visit: Payer: Self-pay

## 2017-06-24 ENCOUNTER — Telehealth: Payer: Self-pay

## 2017-06-24 MED ORDER — ACETAMINOPHEN-CODEINE #3 300-30 MG PO TABS: 1.0000 | ORAL_TABLET | ORAL | 0 refills | Status: DC | PRN

## 2017-06-24 NOTE — Telephone Encounter (Signed)
Pt aware of [facility] message. Note has been printed and put at front desk

## 2017-06-24 NOTE — Telephone Encounter (Signed)
Pt aware to call me back regarding work note. AMS is out of the office this week, note can not be written until medically cleared my MD. Her post-op appointment is 9/7

## 2017-06-24 NOTE — Telephone Encounter (Signed)
Pt is wanting to be seen by another doctor to be cleared to return work. Pt was advised she would need to follow up with Dr. Georgianne Fick due to him being her surgeon. Pt was not understanding and would like to be advise why she has to wait to have a note to go back to work. When she has expressed she returned today.

## 2017-06-24 NOTE — Telephone Encounter (Signed)
Pt requests a return to work note for today from [employer]. [phone].

## 2017-06-24 NOTE — Telephone Encounter (Signed)
Pt came to pick up work note also stating AMS told her on Friday he would put a Rx for Tylenol #3 at front desk for her to pick up. In looking at the medication list w/JEG. Rx was reprinted and given to patient d/t no Rx being left from AMS.

## 2017-06-24 NOTE — Telephone Encounter (Signed)
Surgery 7 days ago, no indication in AMS notes as to return to work, standard is 2 weeks. Would need assessment to ensure not putting herself at risk. If unable to arrange for that, then note for light duty put in system for her/ you. [place]

## 2017-06-24 NOTE — Telephone Encounter (Signed)
Pt is calling back do to missed call. 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 If note is ready Faxed number is [phone]

## 2017-06-24 NOTE — Telephone Encounter (Signed)
AMS is on vacation, please advise or call this patient. She is frustrated

## 2017-07-05 ENCOUNTER — Encounter: Payer: Self-pay | Admitting: Obstetrics and Gynecology

## 2017-07-05 ENCOUNTER — Ambulatory Visit (INDEPENDENT_AMBULATORY_CARE_PROVIDER_SITE_OTHER): Payer: BC Managed Care – PPO | Admitting: Obstetrics and Gynecology

## 2017-07-05 VITALS — BP 108/56 | HR 56 | Wt 152.0 lb

## 2017-07-05 DIAGNOSIS — Z4889 Encounter for other specified surgical aftercare: Secondary | ICD-10-CM

## 2017-07-08 NOTE — Progress Notes (Signed)
      Postoperative Follow-up Patient presents post op from [place], BS, lysis of adhesions and cystoscopy 2weeks ago for uterine fibroids.  Subjective: Patient reports marked improvement in her preop symptoms. Eating a regular diet without difficulty. Pain is controlled without any medications.  Activity: normal activities of daily living.  Objective: Vitals:   07/05/17 1504  BP: (!) 108/56  Pulse: (!) 56   Gen: NAD Abdomen: soft, non-tender, non-distended, trocar sites healing well  Assessment: 41 y.o. s/p TLH, BS, lysis of adhesions and cystoscopy stable  Plan: Patient has done well after surgery with no apparent complications.  I have discussed the post-operative course to date, and the expected progress moving forward.  The patient understands what complications to be concerned about.  I will see the patient in routine follow up, or sooner if needed.    Activity plan: no heavy lifting no intercourse Pathology reviewed benign  Haley Garza 07/08/2017, 10:06 PM

## 2017-08-02 ENCOUNTER — Ambulatory Visit (INDEPENDENT_AMBULATORY_CARE_PROVIDER_SITE_OTHER): Payer: BC Managed Care – PPO | Admitting: Obstetrics and Gynecology

## 2017-08-02 ENCOUNTER — Encounter: Payer: Self-pay | Admitting: Obstetrics and Gynecology

## 2017-08-02 VITALS — BP 102/58 | HR 69 | Wt 146.0 lb

## 2017-08-02 DIAGNOSIS — Z09 Encounter for follow-up examination after completed treatment for conditions other than malignant neoplasm: Secondary | ICD-10-CM

## 2017-08-04 NOTE — Progress Notes (Signed)
      Postoperative Follow-up Patient presents post op from [place], BS, cystoscopy 6weeks ago for abnormal uterine bleeding and fibroids.  Subjective: Patient reports marked improvement in her preop symptoms. Eating a regular diet without difficulty. The patient is not having any pain.  Activity: normal activities of daily living.  Objective: Vitals:   08/02/17 1618  BP: (!) 102/58  Pulse: 69     Assessment: 41 y.o. s/p TLH, BS, and cystoscopy stable  Plan: Patient has done well after surgery with no apparent complications.  I have discussed the post-operative course to date, and the expected progress moving forward.  The patient understands what complications to be concerned about.  I will see the patient in routine follow up, or sooner if needed.    Activity plan: No restriction.  [name] 08/04/2017, 9:52 PM

## 2017-10-02 ENCOUNTER — Telehealth: Payer: Self-pay | Admitting: Unknown Physician Specialty

## 2017-10-02 ENCOUNTER — Encounter: Payer: Self-pay | Admitting: Unknown Physician Specialty

## 2017-10-02 NOTE — Telephone Encounter (Signed)
Missed noticed she had a hysterectomy.  Can we reschedule?

## 2017-10-23 ENCOUNTER — Encounter: Payer: Self-pay | Admitting: Unknown Physician Specialty

## 2017-11-05 ENCOUNTER — Emergency Department
Admission: EM | Admit: 2017-11-05 | Discharge: 2017-11-05 | Disposition: A | Discharge status: Home / Self Care | Payer: BC Managed Care – PPO | Attending: Student in an Organized Health Care Education/Training Program | Admitting: Student in an Organized Health Care Education/Training Program

## 2017-11-05 ENCOUNTER — Emergency Department: Payer: BC Managed Care – PPO

## 2017-11-05 ENCOUNTER — Encounter: Payer: Self-pay | Admitting: Emergency Medicine

## 2017-11-05 ENCOUNTER — Other Ambulatory Visit: Payer: Self-pay

## 2017-11-05 DIAGNOSIS — Z79899 Other long term (current) drug therapy: Secondary | ICD-10-CM | POA: Diagnosis not present

## 2017-11-05 DIAGNOSIS — J45909 Unspecified asthma, uncomplicated: Secondary | ICD-10-CM | POA: Insufficient documentation

## 2017-11-05 DIAGNOSIS — R1031 Right lower quadrant pain: Secondary | ICD-10-CM | POA: Insufficient documentation

## 2017-11-05 DIAGNOSIS — N898 Other specified noninflammatory disorders of vagina: Secondary | ICD-10-CM

## 2017-11-05 DIAGNOSIS — F17210 Nicotine dependence, cigarettes, uncomplicated: Secondary | ICD-10-CM | POA: Diagnosis not present

## 2017-11-05 LAB — COMPREHENSIVE METABOLIC PANEL
ALK PHOS: 62 U/L (ref 38–126)
ALT: 27 U/L (ref 14–54)
AST: 22 U/L (ref 15–41)
Albumin: 3.9 g/dL (ref 3.5–5.0)
Anion gap: 7 (ref 5–15)
BUN: 9 mg/dL (ref 6–20)
CALCIUM: 8.6 mg/dL — AB (ref 8.9–10.3)
CHLORIDE: 107 mmol/L (ref 101–111)
CO2: 25 mmol/L (ref 22–32)
CREATININE: 0.69 mg/dL (ref 0.44–1.00)
GFR calc Af Amer: >60 mL/min (ref >60)
GFR calc non Af Amer: >60 mL/min (ref >60)
GLUCOSE: 95 mg/dL (ref 65–99)
Potassium: 3.7 mmol/L (ref 3.5–5.1)
Sodium: 139 mmol/L (ref 135–145)
Total Bilirubin: 0.9 mg/dL (ref 0.3–1.2)
Total Protein: 6.8 g/dL (ref 6.5–8.1)

## 2017-11-05 LAB — CBC WITH DIFFERENTIAL/PLATELET
Basophils Absolute: 0 10*3/uL (ref 0–0.1)
Basophils Relative: 1 %
Eosinophils Absolute: 0.4 10*3/uL (ref 0–0.7)
Eosinophils Relative: 6 %
HEMATOCRIT: 34.8 % — AB (ref 35.0–47.0)
Hemoglobin: 11.4 g/dL — ABNORMAL LOW (ref 12.0–16.0)
LYMPHS ABS: 1.8 10*3/uL (ref 1.0–3.6)
LYMPHS PCT: 28 %
MCH: 30.1 pg (ref 26.0–34.0)
MCHC: 32.9 g/dL (ref 32.0–36.0)
MCV: 91.7 fL (ref 80.0–100.0)
MONO ABS: 0.6 10*3/uL (ref 0.2–0.9)
MONOS PCT: 9 %
NEUTROS ABS: 3.7 10*3/uL (ref 1.4–6.5)
Neutrophils Relative %: 56 %
Platelets: 252 10*3/uL (ref 150–440)
RBC: 3.79 MIL/uL — ABNORMAL LOW (ref 3.80–5.20)
RDW: 18.3 % — AB (ref 11.5–14.5)
WBC: 6.5 10*3/uL (ref 3.6–11.0)

## 2017-11-05 LAB — WET PREP, GENITAL
Clue Cells Wet Prep HPF POC: NONE SEEN
Sperm: NONE SEEN
Trich, Wet Prep: NONE SEEN
Yeast Wet Prep HPF POC: NONE SEEN

## 2017-11-05 LAB — CHLAMYDIA/NGC RT PCR (ARMC ONLY)
CHLAMYDIA TR: NOT DETECTED
N gonorrhoeae: NOT DETECTED

## 2017-11-05 LAB — URINALYSIS, COMPLETE (UACMP) WITH MICROSCOPIC
BACTERIA UA: NONE SEEN
Bilirubin Urine: NEGATIVE
Glucose, UA: NEGATIVE mg/dL
Hgb urine dipstick: NEGATIVE
KETONES UR: NEGATIVE mg/dL
Leukocytes, UA: NEGATIVE
Nitrite: NEGATIVE
Protein, ur: NEGATIVE mg/dL
SPECIFIC GRAVITY, URINE: 1.019 (ref 1.005–1.030)
pH: 5 (ref 5.0–8.0)

## 2017-11-05 LAB — TROPONIN I: Troponin I: <0.03 ng/mL (ref <0.03)

## 2017-11-05 LAB — LACTIC ACID, PLASMA: Lactic Acid, Venous: 0.9 mmol/L (ref 0.5–1.9)

## 2017-11-05 MED ORDER — SODIUM CHLORIDE 0.9 % IV BOLUS (SEPSIS)
1000.0000 mL | Freq: Once | INTRAVENOUS | Status: AC
  Administered 2017-11-05: 1000 mL via INTRAVENOUS

## 2017-11-05 MED ORDER — TRAMADOL HCL 50 MG PO TABS: 50.0000 mg | ORAL_TABLET | Freq: Four times a day (QID) | ORAL | 0 refills | Status: DC | PRN

## 2017-11-05 MED ORDER — PROMETHAZINE HCL 12.5 MG PO TABS: 12.5000 mg | ORAL_TABLET | Freq: Four times a day (QID) | ORAL | 0 refills | Status: DC | PRN

## 2017-11-05 MED ORDER — MORPHINE SULFATE (PF) 4 MG/ML IV SOLN
4.0000 mg | INTRAVENOUS | Status: DC | PRN
  Administered 2017-11-05 (×2): 4 mg via INTRAVENOUS
  Filled 2017-11-05: qty 1

## 2017-11-05 MED ORDER — AZITHROMYCIN 500 MG PO TABS
1000.0000 mg | ORAL_TABLET | Freq: Once | ORAL | Status: AC
  Administered 2017-11-05: 1000 mg via ORAL
  Filled 2017-11-05: qty 2

## 2017-11-05 MED ORDER — MORPHINE SULFATE (PF) 4 MG/ML IV SOLN
INTRAVENOUS | Status: AC
  Administered 2017-11-05: 4 mg via INTRAVENOUS
  Filled 2017-11-05: qty 1

## 2017-11-05 MED ORDER — CEFTRIAXONE SODIUM 250 MG IJ SOLR
250.0000 mg | Freq: Once | INTRAMUSCULAR | Status: AC
  Administered 2017-11-05: 250 mg via INTRAMUSCULAR
  Filled 2017-11-05: qty 250

## 2017-11-05 MED ORDER — LIDOCAINE HCL (PF) 1 % IJ SOLN
INTRAMUSCULAR | Status: AC
  Filled 2017-11-05: qty 5

## 2017-11-05 MED ORDER — IOPAMIDOL (ISOVUE-300) INJECTION 61%
100.0000 mL | Freq: Once | INTRAVENOUS | Status: AC | PRN
  Administered 2017-11-05: 100 mL via INTRAVENOUS
  Filled 2017-11-05: qty 100

## 2017-11-05 MED ORDER — PROMETHAZINE HCL 25 MG/ML IJ SOLN
12.5000 mg | Freq: Four times a day (QID) | INTRAMUSCULAR | Status: DC | PRN
  Administered 2017-11-05: 12.5 mg via INTRAVENOUS

## 2017-11-05 MED ORDER — PROMETHAZINE HCL 25 MG/ML IJ SOLN
INTRAMUSCULAR | Status: AC
  Administered 2017-11-05: 12.5 mg via INTRAVENOUS
  Filled 2017-11-05: qty 1

## 2017-11-05 NOTE — ED Notes (Signed)
Patient transported to CT 

## 2017-11-05 NOTE — ED Notes (Signed)
E-sign pad not working at this time. Pt signed paper discharge document

## 2017-11-05 NOTE — ED Provider Notes (Signed)
[facility] Emergency Department Provider Note    First MD Initiated Contact with Patient 11/05/17 1321     (approximate)  I have reviewed the triage vital signs and the nursing notes.   HISTORY  Chief Complaint Groin Pain    HPI Haley Garza is a 42 y.o. female with acute right lower quadrant and right inguinal abdominal pain.  States the pain is moderate to severe.  No associated fevers has felt nauseated.  Still moving her bowels.  No dysuria.  Says the pain is persistent.  Is not had any relief with taking Tylenol 3.  Pain is been going on for better part of the day.  Denies any trauma.  No numbness or tingling.  No recent hospitalizations.  Past Medical History:  Diagnosis Date  . Anemia   . Anxiety   . Arthritis   . Depression   . History of kidney stones   . IFG (impaired fasting glucose)   . Obesity   . PONV (postoperative nausea and vomiting)   . Tobacco abuse    Family History  Problem Relation Age of Onset  . Hypertension Mother   . Hyperlipidemia Mother   . Depression Mother   . Fibroids Mother    Past Surgical History:  Procedure Laterality Date  . BARIATRIC SURGERY    . CESAREAN SECTION     x 2   . CYSTOSCOPY N/A 06/18/2017   Procedure: CYSTOSCOPY;  Surgeon: [name], MD;  Location: [facility] ORS;  Service: Gynecology;  Laterality: N/A;  . INTRAUTERINE DEVICE (IUD) INSERTION     feb 2014  . KNEE ARTHROSCOPY Right   . LAPAROSCOPIC HYSTERECTOMY N/A 06/18/2017   Procedure: HYSTERECTOMY TOTAL LAPAROSCOPIC;  Surgeon: [name], MD;  Location: [facility] ORS;  Service: Gynecology;  Laterality: N/A;  . TUBAL LIGATION    . UTERINE FIBROID EMBOLIZATION  01/09/2017   Triangular vascular   Patient Active Problem List   Diagnosis Date Noted  . S/P laparoscopic hysterectomy 06/18/2017  . 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 Bariatric surgery status 09/14/2015  . Asthma 09/14/2015  . Anxiety and depression 09/14/2015  . Tobacco  abuse 09/14/2015  . IFG (impaired fasting glucose) 09/14/2015  . Morbid obesity ([place]) 05/26/2015  . Atypical chest pain 01/26/2015      Prior to Admission medications   Medication Sig Start Date End Date Taking? Authorizing Provider  Ascorbic Acid (VITAMIN C) 1000 MG tablet Take 1,000 mg by mouth daily. Chewable    [provider]  b complex vitamins tablet Take 1 tablet by mouth daily.    [provider]  Biotin 2500 MCG CAPS Take 2,500 mcg by mouth daily.    [provider]  Cholecalciferol (VITAMIN D3) 5000 units CAPS Take 5,000 Units by mouth daily.    [provider]  ferrous sulfate 325 (65 FE) MG tablet Take 65 mg by mouth daily with breakfast.    [provider]  pantoprazole (PROTONIX) 40 MG tablet Take 1 tablet (40 mg total) by mouth daily. Patient not taking: Reported on 06/10/2017 08/13/15 08/12/16  Nance Pear, MD  promethazine (PHENERGAN) 12.5 MG tablet Take 1 tablet (12.5 mg total) by mouth every 6 (six) hours as needed for nausea or vomiting. 11/05/17   Merlyn Lot, MD  traMADol (ULTRAM) 50 MG tablet Take 1 tablet (50 mg total) by mouth every 6 (six) hours as needed. 11/05/17 11/05/18  Merlyn Lot, MD  vitamin A 8000 UNIT capsule Take 8,000 Units by mouth daily.  [provider]  vitamin E 200 UNIT capsule Take 200 Units by mouth daily.    [provider]  VITAMIN K PO Take 100 mcg by mouth daily.    [provider]    Allergies Honey bee treatment [bee venom] and Oxycodone-acetaminophen    Social History Social History   Tobacco Use  . Smoking status: Current Some Day Smoker    Packs/day: 0.25    Types: Cigarettes  . Smokeless tobacco: Never Used  Substance Use Topics  . Alcohol use: No  . Drug use: No    Review of Systems Patient denies headaches, rhinorrhea, blurry vision, numbness, shortness of breath, chest pain, edema, cough, abdominal pain, nausea, vomiting, diarrhea,  dysuria, fevers, rashes or hallucinations unless otherwise stated above in HPI. ____________________________________________   PHYSICAL EXAM:  VITAL SIGNS: Vitals:   11/05/17 1227  BP: 107/83  Pulse: 61  Resp: 18  Temp: 98.6 F (37 C)  SpO2: 100%    Constitutional: Alert and oriented. Well appearing and in no acute distress. Eyes: Conjunctivae are normal.  Head: Atraumatic. Nose: No congestion/rhinnorhea. Mouth/Throat: Mucous membranes are moist.   Neck: No stridor. Painless ROM.  Cardiovascular: Normal rate, regular rhythm. Grossly normal heart sounds.  Good peripheral circulation. Respiratory: Normal respiratory effort.  No retractions. Lungs CTAB. Gastrointestinal: Soft with mild rlq ttp. no distention. No abdominal bruits. No CVA tenderness. Genitourinary: There is purulent discharge but no foul odor.  Does appear to have some vaginal irritation erythema consistent with her reported history of recent Candida.  No adnexal masses but some right-sided tenderness. Musculoskeletal: No lower extremity tenderness nor edema.  No joint effusions. Neurologic:  Normal speech and language. No gross focal neurologic deficits are appreciated. No facial droop Skin:  Skin is warm, dry and intact. No rash noted. Psychiatric: Mood and affect are normal. Speech and behavior are normal.  ____________________________________________   LABS (all labs ordered are listed, but only abnormal results are displayed)  Results for orders placed or performed during the hospital encounter of 11/05/17 (from the past 24 hour(s))  Comprehensive metabolic panel     Status: Abnormal   Collection Time: 11/05/17 12:29 PM  Result Value Ref Range   Sodium 139 135 - 145 mmol/L   Potassium 3.7 3.5 - 5.1 mmol/L   Chloride 107 101 - 111 mmol/L   CO2 25 22 - 32 mmol/L   Glucose, Bld 95 65 - 99 mg/dL   BUN 9 6 - 20 mg/dL   Creatinine, Ser 0.69 0.44 - 1.00 mg/dL   Calcium 8.6 (L) 8.9 - 10.3 mg/dL   Total  Protein 6.8 6.5 - 8.1 g/dL   Albumin 3.9 3.5 - 5.0 g/dL   AST 22 15 - 41 U/L   ALT 27 14 - 54 U/L   Alkaline Phosphatase 62 38 - 126 U/L   Total Bilirubin 0.9 0.3 - 1.2 mg/dL   GFR calc non Af Amer >60 >60 mL/min   GFR calc Af Amer >60 >60 mL/min   Anion gap 7 5 - 15  Troponin I     Status: None   Collection Time: 11/05/17 12:29 PM  Result Value Ref Range   Troponin I <0.03 <0.03 ng/mL  Lactic acid, plasma     Status: None   Collection Time: 11/05/17 12:29 PM  Result Value Ref Range   Lactic Acid, Venous 0.9 0.5 - 1.9 mmol/L  CBC with Differential     Status: Abnormal   Collection Time: 11/05/17 12:29 PM  Result Value Ref Range   WBC 6.5 3.6 - 11.0 K/uL   RBC 3.79 (L) 3.80 - 5.20 MIL/uL   Hemoglobin 11.4 (L) 12.0 - 16.0 g/dL   HCT 34.8 (L) 35.0 - 47.0 %   MCV 91.7 80.0 - 100.0 fL   MCH 30.1 26.0 - 34.0 pg   MCHC 32.9 32.0 - 36.0 g/dL   RDW 18.3 (H) 11.5 - 14.5 %   Platelets 252 150 - 440 K/uL   Neutrophils Relative % 56 %   Neutro Abs 3.7 1.4 - 6.5 K/uL   Lymphocytes Relative 28 %   Lymphs Abs 1.8 1.0 - 3.6 K/uL   Monocytes Relative 9 %   Monocytes Absolute 0.6 0.2 - 0.9 K/uL   Eosinophils Relative 6 %   Eosinophils Absolute 0.4 0 - 0.7 K/uL   Basophils Relative 1 %   Basophils Absolute 0.0 0 - 0.1 K/uL  Urinalysis, Complete w Microscopic     Status: Abnormal   Collection Time: 11/05/17 12:29 PM  Result Value Ref Range   Color, Urine AMBER (A) YELLOW   APPearance CLEAR (A) CLEAR   Specific Gravity, Urine 1.019 1.005 - 1.030   pH 5.0 5.0 - 8.0   Glucose, UA NEGATIVE NEGATIVE mg/dL   Hgb urine dipstick NEGATIVE NEGATIVE   Bilirubin Urine NEGATIVE NEGATIVE   Ketones, ur NEGATIVE NEGATIVE mg/dL   Protein, ur NEGATIVE NEGATIVE mg/dL   Nitrite NEGATIVE NEGATIVE   Leukocytes, UA NEGATIVE NEGATIVE   RBC / HPF 0-5 0 - 5 RBC/hpf   WBC, UA 0-5 0 - 5 WBC/hpf   Bacteria, UA NONE SEEN NONE SEEN   Squamous Epithelial / LPF 0-5 (A) NONE SEEN   Mucus PRESENT   Wet prep,  genital     Status: Abnormal   Collection Time: 11/05/17  3:33 PM  Result Value Ref Range   Yeast Wet Prep HPF POC NONE SEEN NONE SEEN   Trich, Wet Prep NONE SEEN NONE SEEN   Clue Cells Wet Prep HPF POC NONE SEEN NONE SEEN   WBC, Wet Prep HPF POC FEW (A) NONE SEEN   Sperm NONE SEEN    ____________________________________________ ____________________________________________  RADIOLOGY  I personally reviewed all radiographic images ordered to evaluate for the above acute complaints and reviewed radiology reports and findings.  These findings were personally discussed with the patient.  Please see medical record for radiology report.  ____________________________________________   PROCEDURES  Procedure(s) performed:  Procedures    Critical Care performed: no ____________________________________________   INITIAL IMPRESSION / ASSESSMENT AND PLAN / ED COURSE  Pertinent labs & imaging results that were available during my care of the patient were reviewed by me and considered in my medical decision making (see chart for details).  DDX: Appendicitis, hernia, colitis, PID, TOA, torsion, UTI, musculoskeletal strain  Rhylee Pucillo is a 42 y.o. who presents to the ED with acute abdominal pain as described above.  Blood work will be sent for the above differential.  Will provide IV hydration as well as IV pain medication and IV antiemetics.  Based on her pain and to further evaluate for the above differential will order CT imaging.  The patient will be placed on continuous pulse oximetry and telemetry for monitoring.  Laboratory evaluation will be sent to evaluate for the above complaints.     Clinical Course as of Nov 05 1722  Tue Nov 05, 2017  1538 CT imaging shows trace dependent fluid in the pelvis but no evidence of appendicitis.  Presumed ruptured ovarian cyst.  Pain is improved.  Patient states that she has noted some discharge therefore will perform pelvic exam to rule out  PID.  [PR]  1643 Pelvic exam performed.  Will treat empirically for STI.  Seems less consistent with PID.  As she does not have any other examination for her symptoms will be initiated on this therapy.  Patient complaining of worsening pain and does have some right adnexal tenderness therefore will order pelvic ultrasound to rule out torsion.  Patient will be signed out to oncoming physician.  Anticipate discharge home.  [PR]    Clinical Course User Index [PR] Merlyn Lot, MD     ____________________________________________   FINAL CLINICAL IMPRESSION(S) / ED DIAGNOSES  Final diagnoses:  Right lower quadrant abdominal pain  Vaginal discharge  Abdominal pain, RLQ      NEW MEDICATIONS STARTED DURING THIS VISIT:  This SmartLink is deprecated. Use AVSMEDLIST instead to display the medication list for a patient.   Note:  This document was prepared using Dragon voice recognition software and may include unintentional dictation errors.    Merlyn Lot, MD 11/05/17 [phone]

## 2017-11-05 NOTE — ED Triage Notes (Signed)
Pt here for right groin pain that feels like when had surgery last year for uterine fibroids; had total hysterectomy end of last year. Pain constant and present when still or with movement. Tried tylenol # 3 without relief. Did have some sweating last night.

## 2017-11-05 NOTE — ED Provider Notes (Signed)
-----------------------------------------   7:07 PM on 11/05/2017 -----------------------------------------  Patient care assumed from Dr. Quentin Cornwall.  Patient's workup is been largely normal.  Negative wet prep.  Gonorrhea and chlamydia are negative.  Blood work is largely within normal limits.  Urinalysis normal.  CT scan shows possible recent ovarian cyst rupture, no other acute findings noted.  Ultrasound shows no significant abnormality although they were not able to directly visualize the left ovary.  Patient will be discharged with nausea and pain medication, I discussed strict return precautions, the patient is agreeable to this plan of care.   Harvest Dark, MD 11/05/17 Einar Crow

## 2017-11-05 NOTE — Discharge Instructions (Signed)

## 2017-11-05 NOTE — ED Notes (Signed)
FIRST NURSE NOTE: Pt to ER via wheelchair from [facility]. Pt in NAD.

## 2017-11-05 NOTE — ED Notes (Signed)
Discussed with dr Mable Paris

## 2017-11-11 ENCOUNTER — Encounter: Payer: Self-pay | Admitting: Obstetrics and Gynecology

## 2017-11-11 ENCOUNTER — Ambulatory Visit (INDEPENDENT_AMBULATORY_CARE_PROVIDER_SITE_OTHER): Payer: BC Managed Care – PPO | Admitting: Obstetrics and Gynecology

## 2017-11-11 ENCOUNTER — Telehealth: Payer: Self-pay

## 2017-11-11 VITALS — BP 108/68 | HR 52 | Ht 67.0 in | Wt 160.0 lb

## 2017-11-11 DIAGNOSIS — M25551 Pain in right hip: Secondary | ICD-10-CM | POA: Diagnosis not present

## 2017-11-11 DIAGNOSIS — N760 Acute vaginitis: Secondary | ICD-10-CM | POA: Diagnosis not present

## 2017-11-11 DIAGNOSIS — Z23 Encounter for immunization: Secondary | ICD-10-CM | POA: Diagnosis not present

## 2017-11-11 MED ORDER — FLUCONAZOLE 150 MG PO TABS: 150.0000 mg | ORAL_TABLET | Freq: Once | ORAL | 1 refills | Status: AC

## 2017-11-11 NOTE — Telephone Encounter (Signed)
Reviewed Chart. No record found of patient being seen in ER in the Epic system. LMVM TRC. Advised pt we are f/u on On Call service message to verify if patient was seen in any ER & if we can further assist the patient at this time.

## 2017-11-11 NOTE — Telephone Encounter (Signed)
Pt doesn't want to follow up at ER . Pt is schedule with Dr. Gilman Schmidt at 3:50

## 2017-11-11 NOTE — Telephone Encounter (Signed)
Per Report from On Call service, Pt called with c/o severe pain in rt hip, abdomen & back. She was advised by On Call Service to report to ER.

## 2017-11-11 NOTE — Patient Instructions (Signed)

## 2017-11-11 NOTE — Progress Notes (Signed)
Patient ID: Haley Garza, female   DOB: 06-11-1976, 42 y.o.   MRN: [id number]  Reason for Consult: Pelvic Pain (RLQ pain )   Referred by Kathrine Haddock, NP  Subjective:     HPI:  Haley Garza is a 42 y.o. female she presents today with complaints of right hip and pelvic pain. She was seen in the ER on the 8th for this issue. A pelvic US was ordered at that time. The [place] was negative for any ovarian cysts. She has been having normal bowel movements. No diarrhea. Small amount of nausea, no vomiting. No vaginal bleeding. Small discharge that she has been taking OTC medications. Patient is several months post op from a total vaginal hysterectomy.  Pain is worse when walking. She is favoring her right leg and putting minimal pressure on that extremity.   Past Medical History:  Diagnosis Date  . Anemia   . Anxiety   . Arthritis   . Depression   . History of kidney stones   . IFG (impaired fasting glucose)   . Obesity   . PONV (postoperative nausea and vomiting)   . Tobacco abuse    Family History  Problem Relation Age of Onset  . Hypertension Mother   . Hyperlipidemia Mother   . Depression Mother   . Fibroids Mother    Past Surgical History:  Procedure Laterality Date  . BARIATRIC SURGERY    . CESAREAN SECTION     x 2   . CYSTOSCOPY N/A 06/18/2017   Procedure: CYSTOSCOPY;  Surgeon: [name], MD;  Location: [facility] ORS;  Service: Gynecology;  Laterality: N/A;  . INTRAUTERINE DEVICE (IUD) INSERTION     feb 2014  . KNEE ARTHROSCOPY Right   . LAPAROSCOPIC HYSTERECTOMY N/A 06/18/2017   Procedure: HYSTERECTOMY TOTAL LAPAROSCOPIC;  Surgeon: [name], MD;  Location: [facility] ORS;  Service: Gynecology;  Laterality: N/A;  . TUBAL LIGATION    . UTERINE FIBROID EMBOLIZATION  01/09/2017   Triangular vascular    Short Social History:  Social History   Tobacco Use  . Smoking status: Current Some Day Smoker    Packs/day: 0.25    Types: Cigarettes  . Smokeless tobacco:  Never Used  Substance Use Topics  . Alcohol use: No    Allergies  Allergen Reactions  . Honey Bee Treatment [Bee Venom] Itching and Swelling  . Oxycodone-Acetaminophen Hives and Itching    Current Outpatient Medications  Medication Sig Dispense Refill  . Ascorbic Acid (VITAMIN C) 1000 MG tablet Take 1,000 mg by mouth daily. Chewable    . b complex vitamins tablet Take 1 tablet by mouth daily.    . Biotin 2500 MCG CAPS Take 2,500 mcg by mouth daily.    . Calcium-Magnesium-Zinc 333-133-5 MG TABS Take by mouth.    . Cholecalciferol (VITAMIN D3) 5000 units CAPS Take 5,000 Units by mouth daily.    . ferrous sulfate 325 (65 FE) MG tablet Take 65 mg by mouth daily with breakfast.    . Multiple Vitamin (MULTIVITAMIN) tablet Take 1 tablet by mouth daily.    . promethazine (PHENERGAN) 12.5 MG tablet Take 1 tablet (12.5 mg total) by mouth every 6 (six) hours as needed for nausea or vomiting. 12 tablet 0  . vitamin A 8000 UNIT capsule Take 8,000 Units by mouth daily.    . vitamin E 200 UNIT capsule Take 200 Units by mouth daily.    Marland Kitchen VITAMIN K PO Take 100 mcg by mouth daily.    Marland Kitchen  fluconazole (DIFLUCAN) 150 MG tablet Take 1 tablet (150 mg total) by mouth once for 1 dose. Can take additional dose three days later if symptoms persist 2 tablet 1  . traMADol (ULTRAM) 50 MG tablet Take 1 tablet (50 mg total) by mouth every 6 (six) hours as needed. (Patient not taking: Reported on 11/11/2017) 10 tablet 0   No current facility-administered medications for this visit.     Review of Systems  Constitutional: Negative for chills, fatigue, fever and unexpected weight change.  HENT: Negative for trouble swallowing.  Eyes: Negative for loss of vision.  Respiratory: Negative for cough, shortness of breath and wheezing.  Cardiovascular: Negative for chest pain, leg swelling, palpitations and syncope.  GI: Negative for abdominal pain, blood in stool, diarrhea, nausea and vomiting.  GU: Negative for difficulty  urinating, dysuria, frequency and hematuria.  Musculoskeletal: Positive for gait problem and leg pain. Negative for back pain and joint pain.  Skin: Negative for rash.  Neurological: Negative for dizziness, headaches, light-headedness, numbness and seizures.  Psychiatric: Negative for behavioral problem, confusion, depressed mood and sleep disturbance.        Objective:  Objective   Vitals:   11/11/17 1609  BP: 108/68  Pulse: (!) 52  Weight: 160 lb (72.6 kg)  Height: 5\' 7"  (1.702 m)   Body mass index is 25.06 kg/m.  Physical Exam  Constitutional: She is oriented to person, place, and time. She appears well-developed and well-nourished.  HENT:  Head: Normocephalic and atraumatic.  Eyes: EOM are normal.  Cardiovascular: Normal rate, regular rhythm and normal heart sounds.  Pulmonary/Chest: Effort normal and breath sounds normal.  Abdominal: Soft. She exhibits no distension and no mass. There is tenderness. There is no rebound and no guarding.  Genitourinary: There is no rash on the right labia. There is no rash on the left labia. Right adnexum displays no mass, no tenderness and no fullness. Left adnexum displays no mass, no tenderness and no fullness. No tenderness or bleeding in the vagina. No vaginal discharge found.  Genitourinary Comments: Vaginal cuff intact.  Musculoskeletal:       Right hip: She exhibits tenderness and bony tenderness. She exhibits no swelling and no deformity.       Legs: Neurological: She is alert and oriented to person, place, and time.  Skin: Skin is warm and dry.  Psychiatric: She has a normal mood and affect. Her behavior is normal. Judgment and thought content normal.  Nursing note and vitals reviewed.   [place] from ER reviewed  IMPRESSION: Status post hysterectomy. The left ovary was not visualized due to overlying bowel. The right ovary is unremarkable. No adnexal mass is seen.     Assessment/Plan:   42yo  Pelvic and right lateral hip pain  likely related to musculoskeletal pain. Limited range of motion and difficulty walking. Will refer patient to orthopedics for further management of symptoms. Given instructions to rest and ice. Patient has a hisotry of gastric bypass so she needs to avaoid NSAID therapy.  Diflucan prescription sent for symptoms of yeast infection.  Homero Fellers MD [facility] 11/11/17 5:24 PM

## 2017-11-14 ENCOUNTER — Ambulatory Visit (INDEPENDENT_AMBULATORY_CARE_PROVIDER_SITE_OTHER): Payer: BC Managed Care – PPO | Admitting: Orthopaedic Surgery

## 2018-04-01 ENCOUNTER — Encounter: Payer: Self-pay | Admitting: Unknown Physician Specialty

## 2018-04-01 ENCOUNTER — Ambulatory Visit (INDEPENDENT_AMBULATORY_CARE_PROVIDER_SITE_OTHER): Payer: BC Managed Care – PPO | Admitting: Unknown Physician Specialty

## 2018-04-01 VITALS — BP 123/73 | HR 66 | Temp 98.5°F | Ht 65.6 in | Wt 155.4 lb

## 2018-04-01 DIAGNOSIS — Z23 Encounter for immunization: Secondary | ICD-10-CM

## 2018-04-01 DIAGNOSIS — N644 Mastodynia: Secondary | ICD-10-CM | POA: Diagnosis not present

## 2018-04-01 DIAGNOSIS — Z9884 Bariatric surgery status: Secondary | ICD-10-CM | POA: Diagnosis not present

## 2018-04-01 DIAGNOSIS — Z720 Tobacco use: Secondary | ICD-10-CM

## 2018-04-01 DIAGNOSIS — J4520 Mild intermittent asthma, uncomplicated: Secondary | ICD-10-CM | POA: Diagnosis not present

## 2018-04-01 DIAGNOSIS — Z0001 Encounter for general adult medical examination with abnormal findings: Secondary | ICD-10-CM

## 2018-04-01 DIAGNOSIS — Z0000 Encounter for general adult medical examination without abnormal findings: Secondary | ICD-10-CM

## 2018-04-01 MED ORDER — VARENICLINE TARTRATE 1 MG PO TABS: 1.0000 mg | ORAL_TABLET | Freq: Two times a day (BID) | ORAL | 5 refills | Status: DC

## 2018-04-01 MED ORDER — VARENICLINE TARTRATE 0.5 MG PO TABS: 0.5000 mg | ORAL_TABLET | Freq: Two times a day (BID) | ORAL | 0 refills | Status: DC

## 2018-04-01 NOTE — Patient Instructions (Addendum)
Td Vaccine (Tetanus and Diphtheria): What You Need to Know 1. Why get vaccinated? Tetanus  and diphtheria are very serious diseases. They are rare in the [place] today, but people who do become infected often have severe complications. Td vaccine is used to protect adolescents and adults from both of these diseases. Both tetanus and diphtheria are infections caused by bacteria. Diphtheria spreads from person to person through coughing or sneezing. Tetanus-causing bacteria enter the body through cuts, scratches, or wounds. TETANUS (lockjaw) causes painful muscle tightening and stiffness, usually all over the body.  It can lead to tightening of muscles in the head and neck so you can't open your mouth, swallow, or sometimes even breathe. Tetanus kills about 1 out of every 10 people who are infected even after receiving the best medical care.  DIPHTHERIA can cause a thick coating to form in the back of the throat.  It can lead to breathing problems, paralysis, heart failure, and death.  Before vaccines, as many as 200,000 cases of diphtheria and hundreds of cases of tetanus were reported in the [place] each year. Since vaccination began, reports of cases for both diseases have dropped by about 99%. 2. Td vaccine Td vaccine can protect adolescents and adults from tetanus and diphtheria. Td is usually given as a booster dose every 10 years but it can also be given earlier after a severe and dirty wound or burn. Another vaccine, called Tdap, which protects against pertussis in addition to tetanus and diphtheria, is sometimes recommended instead of Td vaccine. Your doctor or the person giving you the vaccine can give you more information. Td may safely be given at the same time as other vaccines. 3. Some people should not get this vaccine  A person who has ever had a life-threatening allergic reaction after a previous dose of any tetanus or diphtheria containing vaccine, OR has a severe  allergy to any part of this vaccine, should not get Td vaccine. Tell the person giving the vaccine about any severe allergies.  Talk to your doctor if you: ? had severe pain or swelling after any vaccine containing diphtheria or tetanus, ? ever had a condition called Guillain Barre Syndrome (GBS), ? aren't feeling well on the day the shot is scheduled. 4. What are the risks from Td vaccine? With any medicine, including vaccines, there is a chance of side effects. These are usually mild and go away on their own. Serious reactions are also possible but are rare. Most people who get Td vaccine do not have any problems with it. Mild problems following Td vaccine: (Did not interfere with activities)  Pain where the shot was given (about 8 people in 10)  Redness or swelling where the shot was given (about 1 person in 4)  Mild fever (rare)  Headache (about 1 person in 4)  Tiredness (about 1 person in 4)  Moderate problems following Td vaccine: (Interfered with activities, but did not require medical attention)  Fever over 102F (rare)  Severe problems following Td vaccine: (Unable to perform usual activities; required medical attention)  Swelling, severe pain, bleeding and/or redness in the arm where the shot was given (rare).  Problems that could happen after any vaccine:  People sometimes faint after a medical procedure, including vaccination. Sitting or lying down for about 15 minutes can help prevent fainting, and injuries caused by a fall. Tell your doctor if you feel dizzy, or have vision changes or ringing in the ears.  Some people get  severe pain in the shoulder and have difficulty moving the arm where a shot was given. This happens very rarely.  Any medication can cause a severe allergic reaction. Such reactions from a vaccine are very rare, estimated at fewer than 1 in a million doses, and would happen within a few minutes to a few hours after the vaccination. As with any  medicine, there is a very remote chance of a vaccine causing a serious injury or death. The safety of vaccines is always being monitored. For more information, visit: http://www.aguilar.org/ 5. What if there is a serious reaction? What should I look for? Look for anything that concerns you, such as signs of a severe allergic reaction, very high fever, or unusual behavior. Signs of a severe allergic reaction can include hives, swelling of the face and throat, difficulty breathing, a fast heartbeat, dizziness, and weakness. These would usually start a few minutes to a few hours after the vaccination. What should I do?  If you think it is a severe allergic reaction or other emergency that can't wait, call 9-1-1 or get the person to the nearest hospital. Otherwise, call your doctor.  Afterward, the reaction should be reported to the Vaccine Adverse Event Reporting System (VAERS). Your doctor might file this report, or you can do it yourself through the VAERS web site at www.vaers.SamedayNews.es, or by calling [phone]. ? VAERS does not give medical advice. 6. The National Vaccine Injury Compensation Program The [employer] Vaccine Injury Compensation Program (VICP) is a federal program that was created to compensate people who may have been injured by certain vaccines. Persons who believe they may have been injured by a vaccine can learn about the program and about filing a claim by calling 540-685-1348 or visiting the [place] website at GoldCloset.com.ee. There is a time limit to file a claim for compensation. 7. How can I learn more?  Ask your doctor. He or she can give you the vaccine package insert or suggest other sources of information.  Call your local or state health department.  Contact the Centers for Disease Control and Prevention (CDC): ? Call 573-505-2371 (1-800-CDC-INFO) ? Visit CDC's website at http://hunter.com/ CDC Td Vaccine VIS (02/07/16) This information is  not intended to replace advice given to you by your health care provider. Make sure you discuss any questions you have with your health care provider.  ----------------------------------------------------------- Please do call to schedule your mammogram; the number to schedule one at either [facility] or [facility] is [phone]  ---------------------------------------------------------------  Preventive Care 40-64 Years, Female Preventive care refers to lifestyle choices and visits with your health care provider that can promote health and wellness. What does preventive care include?  A yearly physical exam. This is also called an annual well check.  Dental exams once or twice a year.  Routine eye exams. Ask your health care provider how often you should have your eyes checked.  Personal lifestyle choices, including: ? Daily care of your teeth and gums. ? Regular physical activity. ? Eating a healthy diet. ? Avoiding tobacco and drug use. ? Limiting alcohol use. ? Practicing safe sex. ? Taking low-dose aspirin daily starting at age 40. ? Taking vitamin and mineral supplements as recommended by your health care provider. What happens during an annual well check? The services and screenings done by your health care provider during your annual well check will depend on your age, overall health, lifestyle risk factors, and family history of disease. Counseling Your health care  provider may ask you questions about your:  Alcohol use.  Tobacco use.  Drug use.  Emotional well-being.  Home and relationship well-being.  Sexual activity.  Eating habits.  Work and work Statistician.  Method of birth control.  Menstrual cycle.  Pregnancy history.  Screening You may have the following tests or measurements:  Height, weight, and BMI.  Blood pressure.  Lipid and cholesterol levels. These may be checked every 5 years, or more frequently if  you are over 49 years old.  Skin check.  Lung cancer screening. You may have this screening every year starting at age 35 if you have a 30-pack-year history of smoking and currently smoke or have quit within the past 15 years.  Fecal occult blood test (FOBT) of the stool. You may have this test every year starting at age 57.  Flexible sigmoidoscopy or colonoscopy. You may have a sigmoidoscopy every 5 years or a colonoscopy every 10 years starting at age 37.  Hepatitis C blood test.  Hepatitis B blood test.  Sexually transmitted disease (STD) testing.  Diabetes screening. This is done by checking your blood sugar (glucose) after you have not eaten for a while (fasting). You may have this done every 1-3 years.  Mammogram. This may be done every 1-2 years. Talk to your health care provider about when you should start having regular mammograms. This may depend on whether you have a family history of breast cancer.  BRCA-related cancer screening. This may be done if you have a family history of breast, ovarian, tubal, or peritoneal cancers.  Pelvic exam and Pap test. This may be done every 3 years starting at age 28. Starting at age 32, this may be done every 5 years if you have a Pap test in combination with an HPV test.  Bone density scan. This is done to screen for osteoporosis. You may have this scan if you are at high risk for osteoporosis.  Discuss your test results, treatment options, and if necessary, the need for more tests with your health care provider. Vaccines Your health care provider may recommend certain vaccines, such as:  Influenza vaccine. This is recommended every year.  Tetanus, diphtheria, and acellular pertussis (Tdap, Td) vaccine. You may need a Td booster every 10 years.  Varicella vaccine. You may need this if you have not been vaccinated.  Zoster vaccine. You may need this after age 36.  Measles, mumps, and rubella (MMR) vaccine. You may need at least one  dose of MMR if you were born in 1957 or later. You may also need a second dose.  Pneumococcal 13-valent conjugate (PCV13) vaccine. You may need this if you have certain conditions and were not previously vaccinated.  Pneumococcal polysaccharide (PPSV23) vaccine. You may need one or two doses if you smoke cigarettes or if you have certain conditions.  Meningococcal vaccine. You may need this if you have certain conditions.  Hepatitis A vaccine. You may need this if you have certain conditions or if you travel or work in places where you may be exposed to hepatitis A.  Hepatitis B vaccine. You may need this if you have certain conditions or if you travel or work in places where you may be exposed to hepatitis B.  Haemophilus influenzae type b (Hib) vaccine. You may need this if you have certain conditions.  Talk to your health care provider about which screenings and vaccines you need and how often you need them. This information is not intended to replace  advice given to you by your health care provider. Make sure you discuss any questions you have with your health care provider. Document Released: 11/11/2015 Document Revised: 07/04/2016 Document Reviewed: 08/16/2015 Elsevier Interactive Patient Education  [employer].

## 2018-04-01 NOTE — Assessment & Plan Note (Signed)
S/P bariatric surgery in 2016.  Pt doing very well

## 2018-04-01 NOTE — Assessment & Plan Note (Signed)
Rare use of inhaler.  Keep on hand for viral illness

## 2018-04-01 NOTE — Assessment & Plan Note (Addendum)
Pt would like to start Chantix.  Pt ed on starting 2 weeks before quit smoking date.  Discussed side-effects, when to start Chantix, and legth of time for optimal success in quitting.

## 2018-04-01 NOTE — Progress Notes (Signed)
BP 123/73   Pulse 66   Temp 98.5 F (36.9 C) (Oral)   Ht 5' 5.6" (1.666 m)   Wt 155 lb 6.4 oz (70.5 kg)   LMP 05/27/2017 Comment: preg test neg  SpO2 100%   BMI 25.39 kg/m    Subjective:    Patient ID: Haley Garza, female    DOB: 01-Jul-1976, 42 y.o.   MRN: [id number]  HPI: Haley Garza is a 42 y.o. female  Chief Complaint  Patient presents with  . Annual Exam    pt is ok with having HIV lab done  . Breast Pain    pt states she has had on and off right breast pain for a few months    Left breast pain For 2 months.  Comes and goes.  Sometimes prior to menses but other times too.  Sore to couch.  Pain radiates from sternum to nipple.  Not associated with activity.    Tobacco Smokes 1/2 ppd.  Able to cut back with gum.  She is willing to try Chantix.      Asthma RAD with viral URIs.     Social History   Socioeconomic History  . Marital status: Married    Spouse name: Not on file  . Number of children: Not on file  . Years of education: Not on file  . Highest education level: Not on file  Occupational History  . Not on file  Social Needs  . Financial resource strain: Not on file  . Food insecurity:    Worry: Not on file    Inability: Not on file  . Transportation needs:    Medical: Not on file    Non-medical: Not on file  Tobacco Use  . Smoking status: Current Every Day Smoker    Packs/day: 0.50    Types: Cigarettes  . Smokeless tobacco: Never Used  Substance and Sexual Activity  . Alcohol use: No  . Drug use: No  . Sexual activity: Not Currently    Birth control/protection: None  Lifestyle  . Physical activity:    Days per week: Not on file    Minutes per session: Not on file  . Stress: Not on file  Relationships  . Social connections:    Talks on phone: Not on file    Gets together: Not on file    Attends religious service: Not on file    Active member of club or organization: Not on file    Attends meetings of clubs or organizations:  Not on file    Relationship status: Not on file  . Intimate partner violence:    Fear of current or ex partner: Not on file    Emotionally abused: Not on file    Physically abused: Not on file    Forced sexual activity: Not on file  Other Topics Concern  . Not on file  Social History Narrative  . Not on file   Family History  Problem Relation Age of Onset  . Hypertension Mother   . Hyperlipidemia Mother   . Depression Mother   . Fibroids Mother    Past Medical History:  Diagnosis Date  . Anemia   . Anxiety   . Arthritis   . Depression   . History of kidney stones   . IFG (impaired fasting glucose)   . Obesity   . PONV (postoperative nausea and vomiting)   . Tobacco abuse    Family History  Problem Relation Age of Onset  .  Hypertension Mother   . Hyperlipidemia Mother   . Depression Mother   . Fibroids Mother     Relevant past medical, surgical, family and social history reviewed and updated as indicated. Interim medical history since our last visit reviewed. Allergies and medications reviewed and updated.  Review of Systems  Per HPI unless specifically indicated above     Objective:    BP 123/73   Pulse 66   Temp 98.5 F (36.9 C) (Oral)   Ht 5' 5.6" (1.666 m)   Wt 155 lb 6.4 oz (70.5 kg)   LMP 05/27/2017 Comment: preg test neg  SpO2 100%   BMI 25.39 kg/m   Wt Readings from Last 3 Encounters:  04/01/18 155 lb 6.4 oz (70.5 kg)  11/11/17 160 lb (72.6 kg)  11/05/17 144 lb (65.3 kg)    Physical Exam  Constitutional: She is oriented to person, place, and time. She appears well-developed and well-nourished.  HENT:  Head: Normocephalic and atraumatic.  Eyes: Pupils are equal, round, and reactive to light. Right eye exhibits no discharge. Left eye exhibits no discharge. No scleral icterus.  Neck: Normal range of motion. Neck supple. Carotid bruit is not present. No thyromegaly present.  Cardiovascular: Normal rate, regular rhythm and normal heart sounds.  Exam reveals no gallop and no friction rub.  No murmur heard. Pulmonary/Chest: Effort normal and breath sounds normal. No respiratory distress. She has no wheezes. She has no rales. No breast tenderness or discharge.  Abdominal: Soft. Bowel sounds are normal. There is no tenderness. There is no rebound.  Genitourinary: No breast tenderness or discharge.  Musculoskeletal: Normal range of motion.  Lymphadenopathy:    She has no cervical adenopathy.  Neurological: She is alert and oriented to person, place, and time.  Skin: Skin is warm, dry and intact. No rash noted.  Psychiatric: She has a normal mood and affect. Her speech is normal and behavior is normal. Judgment and thought content normal. Cognition and memory are normal.      EKG:NSR.  Bradycardia which is normal for her Assessment & Plan:   Problem List Items Addressed This Visit      Unprioritized   Asthma    Rare use of inhaler.  Keep on hand for viral illness       Bariatric surgery status    S/P bariatric surgery in 2016.  Pt doing very well      Tobacco abuse    Pt would like to start Chantix.  Pt ed on starting 2 weeks before quit smoking date.  Discussed side-effects, when to start Chantix, and legth of time for optimal success in quitting.         Other Visit Diagnoses    Need for Td vaccine    -  Primary   Relevant Orders   Td vaccine greater than or equal to 7yo preservative free IM (Completed)   Breast pain, right       EKG normal.  No masses noted.  Will order mammogram   Relevant Orders   EKG 12-Lead (Completed)   [facility] (Completed)   Routine general medical examination at a health care facility       Relevant Orders   CBC with Differential/Platelet   Comprehensive metabolic panel   Lipid Panel w/o Chol/HDL Ratio   TSH   HIV antibody   [facility] (Completed)       Follow up plan: Return in about 1 year (around 04/02/2019).

## 2018-04-08 ENCOUNTER — Telehealth: Payer: Self-pay

## 2018-04-08 NOTE — Telephone Encounter (Signed)
-----   Message from Kathrine Haddock, NP sent at 04/07/2018 11:57 AM EDT ----- Regarding: Never got labs Can you call her and ask her to come in for labs?

## 2018-04-08 NOTE — Telephone Encounter (Signed)
Called and left patient a VM (signed DPR) asking for her to please come in for labs.

## 2018-04-28 ENCOUNTER — Telehealth: Payer: Self-pay

## 2018-04-28 NOTE — Telephone Encounter (Signed)
Letter generated and sent to patient.  

## 2018-04-28 NOTE — Telephone Encounter (Signed)
-----   Message from Kathrine Haddock, NP sent at 04/28/2018  3:51 PM EDT ----- Regarding: labs This pt left without getting labs.  Can she come in and get them?

## 2018-07-15 ENCOUNTER — Telehealth: Payer: Self-pay | Admitting: Unknown Physician Specialty

## 2018-07-15 NOTE — Telephone Encounter (Signed)
Patient dropped off a form staff health assessment/medical report form to be completed, she states she needs this done today but I explained [name] would not be in until Friday and that Dr Wynetta Emery was the only provider working this afternoon.  I did let her know I would send this message to see if someone could take a look at this and if something could be done before Friday we would let her know.  She also put a fax number on the form for [place] to please fax once completed.  Put the form/folder in Cheryls box  Thank you

## 2018-07-16 NOTE — Telephone Encounter (Signed)
Will give form to [facility] when she comes to the office on Friday.

## 2018-07-18 IMAGING — CT CT ABD-PELV W/ CM
2 of 4 series · 15 of 46 positions shown, 17 images · IV contrast (APPLIED)
Comparison: May 11, 2017

CLINICAL DATA: Right lower abdominal and pelvic region pain

EXAM:
CT ABDOMEN AND PELVIS WITH CONTRAST
TECHNIQUE: Multidetector CT imaging of the abdomen and pelvis was performed
using the standard protocol following bolus administration of
intravenous contrast.
CONTRAST:  100mL [id number] IOPAMIDOL (MS3VZG-SHH) INJECTION 61%

[Series 2: routine abd/pel with · axial · 0.83mm/px · z∈[-508,-54]mm · 12 of 105 slices shown, 14 images]
[im 9/105  soft-tissue]
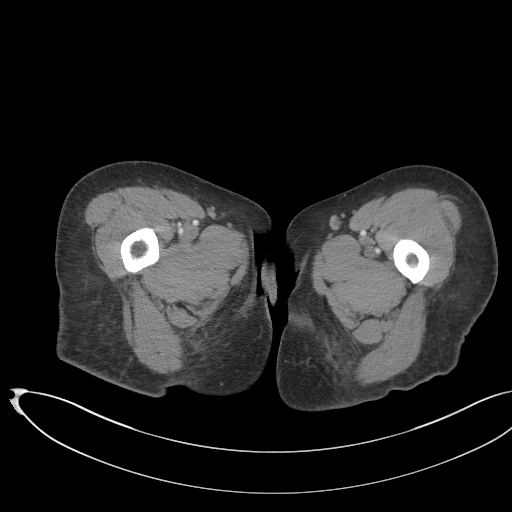
[im 9/105  bone]
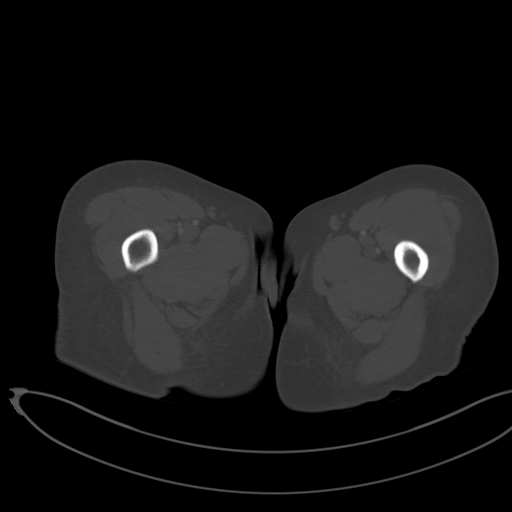
[im 17/105  soft-tissue]
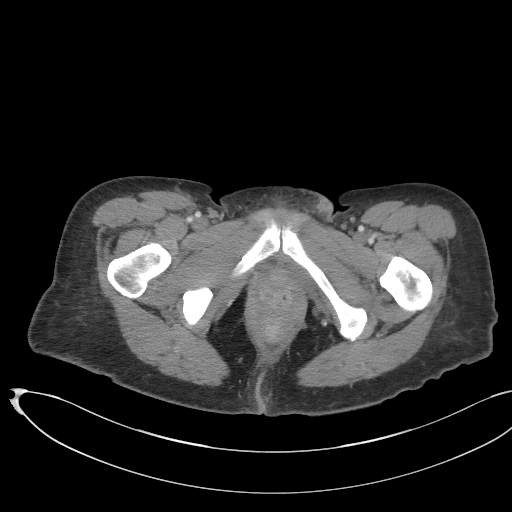
[im 25/105  soft-tissue]
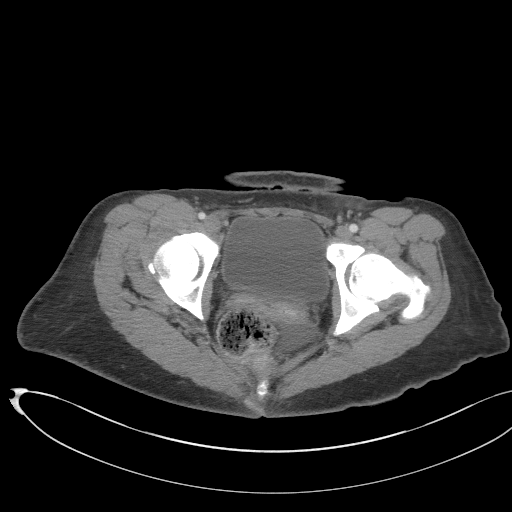
[im 34/105  soft-tissue]
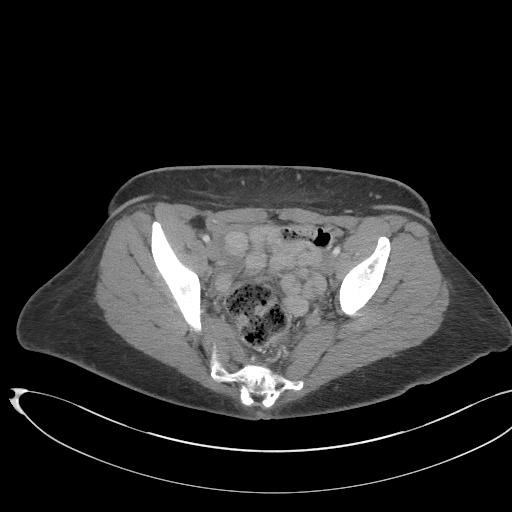
[im 42/105  soft-tissue]
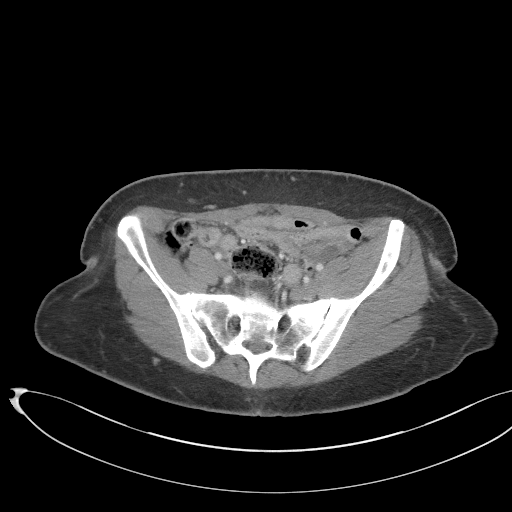
[im 50/105  soft-tissue]
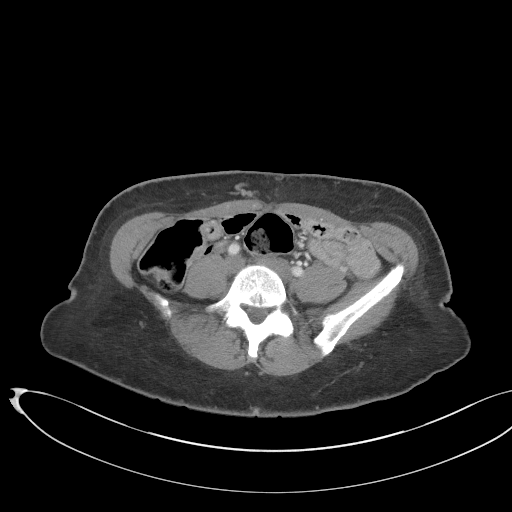
[im 59/105  soft-tissue]
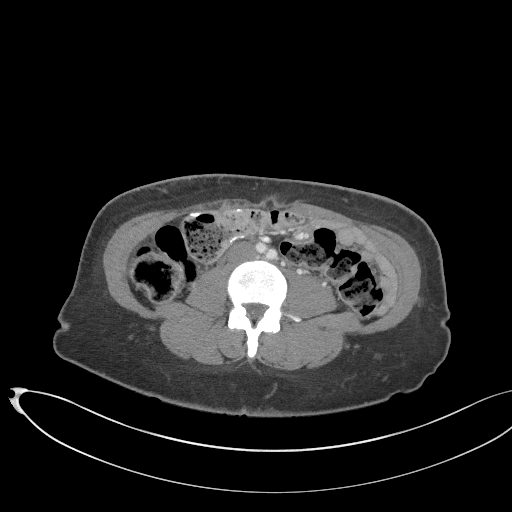
[im 67/105  soft-tissue]
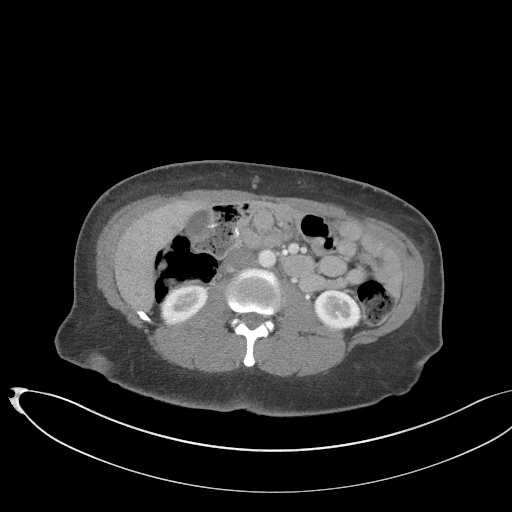
[im 75/105  soft-tissue]
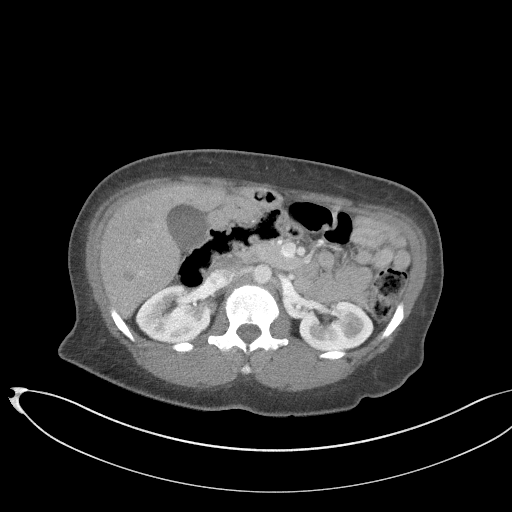
[im 75/105  bone]
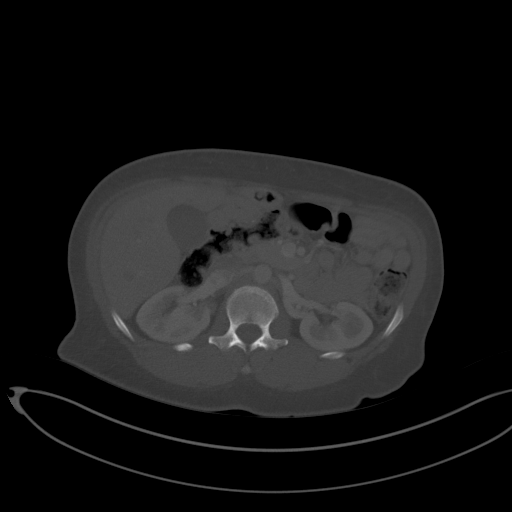
[im 84/105  soft-tissue]
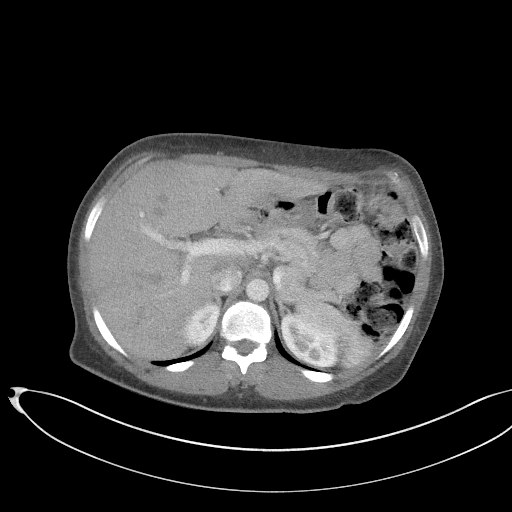
[im 92/105  soft-tissue]
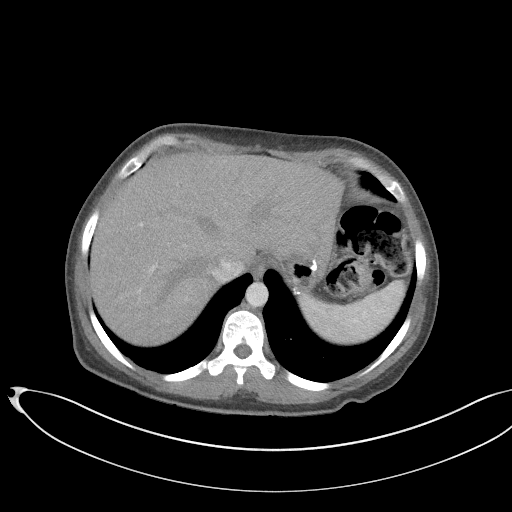
[im 100/105  soft-tissue]
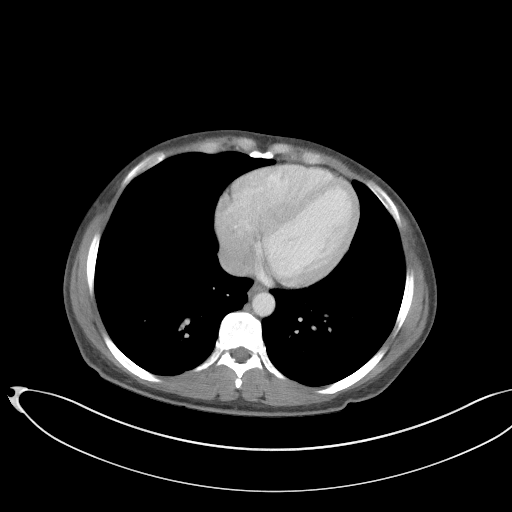

[Series 5: coronal st · coronal · 0.82mm/px · 3 of 81 slices shown]
[im 27/81  soft-tissue]
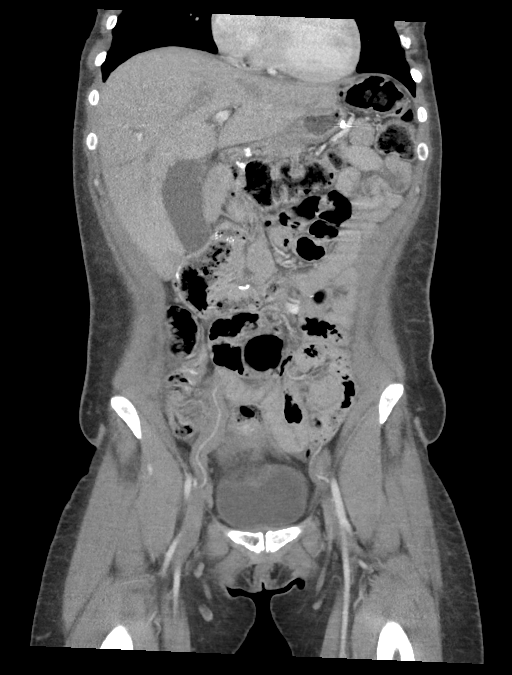
[im 36/81  soft-tissue]
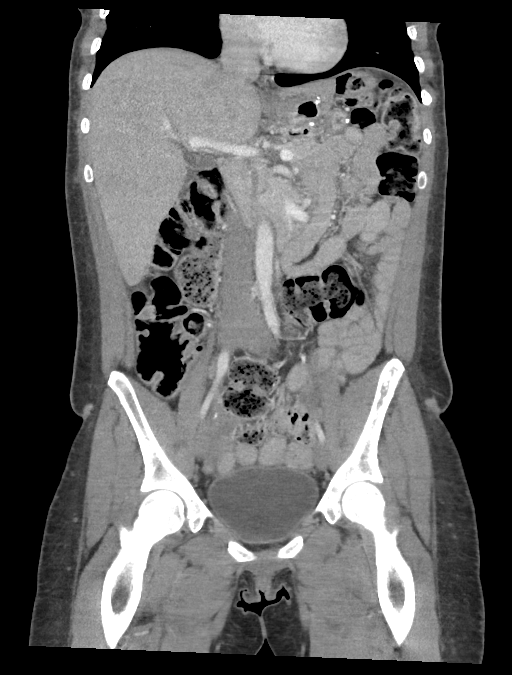
[im 45/81  soft-tissue]
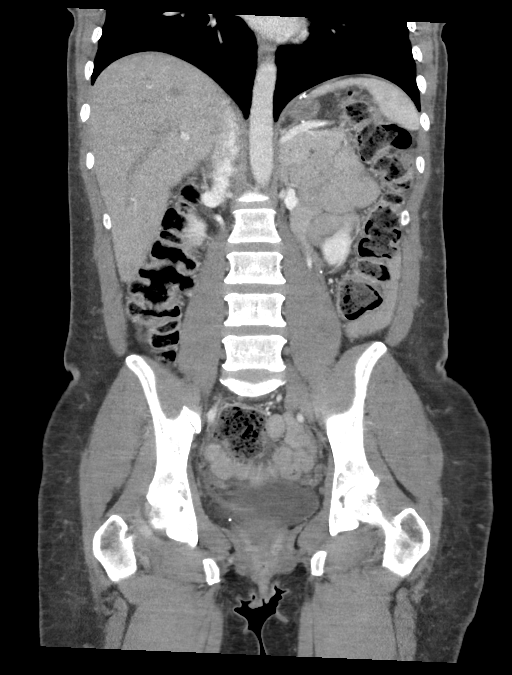

[15 of 46 positions shown; findings below may reference images not displayed]

FINDINGS: Lower chest: Lung bases are clear.

Hepatobiliary: There is a degree of hepatic steatosis. No focal
liver lesions are apparent. Gallbladder wall is not appreciably
thickened. There is no biliary duct dilatation.

Pancreas: There is no evident pancreatic mass or inflammatory focus.

Spleen: No splenic lesions are apparent.

Adrenals/Urinary Tract: Adrenals bilaterally appear normal. Kidneys
bilaterally show no evident mass or hydronephrosis on either side.
There is no renal or ureteral calculus on either side. The urinary
bladder is midline with wall thickness within normal limits.

Stomach/Bowel: The patient has had previous gastric bypass surgery
without fluid or wall thickening in the perioperative regions. There
is moderate stool throughout the colon. There is no appreciable
bowel wall or mesenteric thickening. No bowel obstruction. No free
air or portal venous air.

Vascular/Lymphatic: No abdominal aortic aneurysm. No vascular
lesions are evident. There is no appreciable adenopathy in the
abdomen or pelvis.

Reproductive: Uterus is absent. No pelvic masses evident. There is
moderate free fluid in the dependent portion of the pelvis
posteriorly.

Other: There is a rather minimal ventral hernia containing only fat.
Appendix appears normal. No abscess evident. No ascites seen beyond
the fluid in the dependent portion of the pelvis. No lesion is
evident in the inguinal areas.

Musculoskeletal: There are no apparent blastic or lytic bone
lesions. No intramuscular or abdominal wall lesions are appreciable.
IMPRESSION: 1. Fluid noted in the dependent portion the pelvis. Suspect recent
ovarian cyst rupture.

2. Since prior study, uterus has been removed. No evident pelvic
mass. No inguinal region lesions evident.

3. Status post gastric bypass procedure without complicating
features evident. No evident bowel obstruction. No bowel wall
thickening. No abscess. Appendix appears normal.

4.  No renal or ureteral calculus.  No hydronephrosis.

5.  Hepatic steatosis.  No focal liver lesions evident.

## 2018-07-18 NOTE — Telephone Encounter (Signed)
Form filled out and signed by [name]. Faxed per patient request. Tried calling to let her know but the number on file is not a valid number.

## 2018-07-27 IMAGING — US US ART/VEN ABD/PELV/SCROTUM DOPPLER LTD
1 series · 14 of 25 positions shown · non-contrast
Comparison: None.

CLINICAL DATA: Right lower quadrant abdominal pain

EXAM:
TRANSABDOMINAL AND TRANSVAGINAL ULTRASOUND OF PELVIS
DOPPLER ULTRASOUND OF OVARIES
TECHNIQUE: Both transabdominal and transvaginal ultrasound examinations of the
pelvis were performed. Transabdominal technique was performed for
global imaging of the pelvis including uterus, ovaries, adnexal
regions, and pelvic cul-de-sac.
It was necessary to proceed with endovaginal exam following the
transabdominal exam to visualize the ovaries and adnexa. Color and
duplex Doppler ultrasound was utilized to evaluate blood flow to the
ovaries.

[Series 1: us art/ven abd/pelv/scrotum doppler ltd · 0.20mm/px · 14 of 122 slices shown]
[im 1/122]
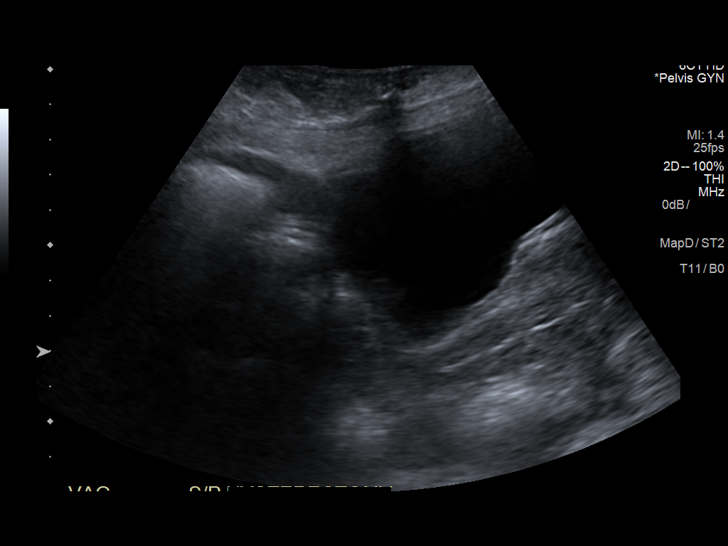
[im 11/122]
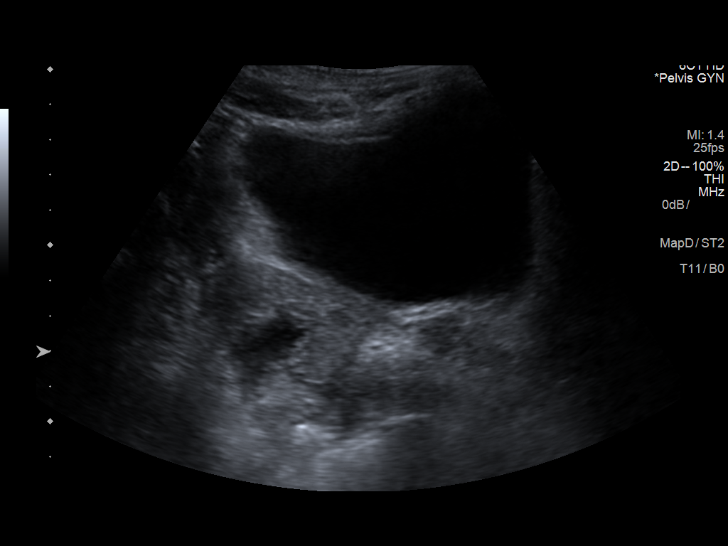
[im 21/122]
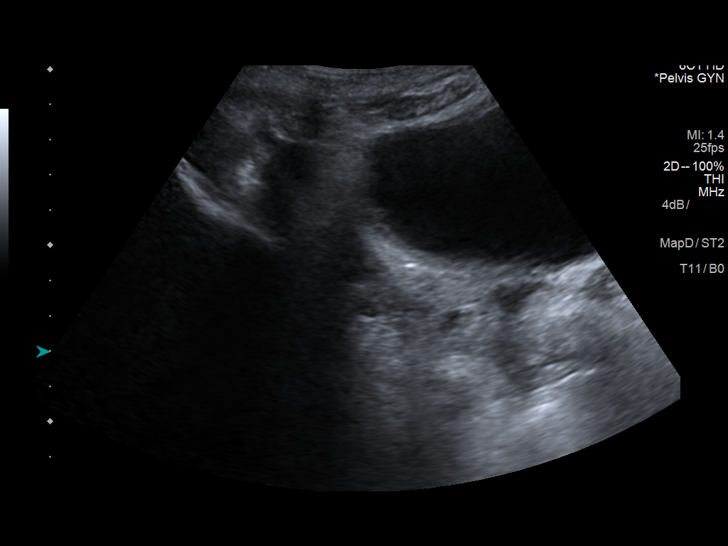
[im 31/122]
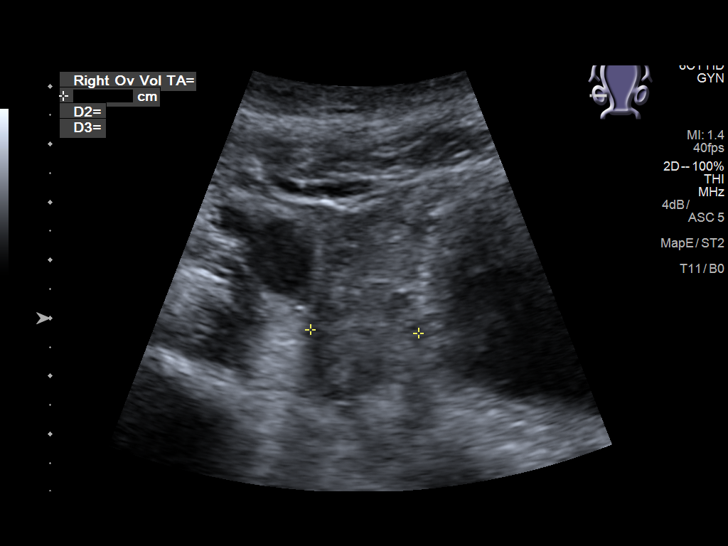
[im 41/122]
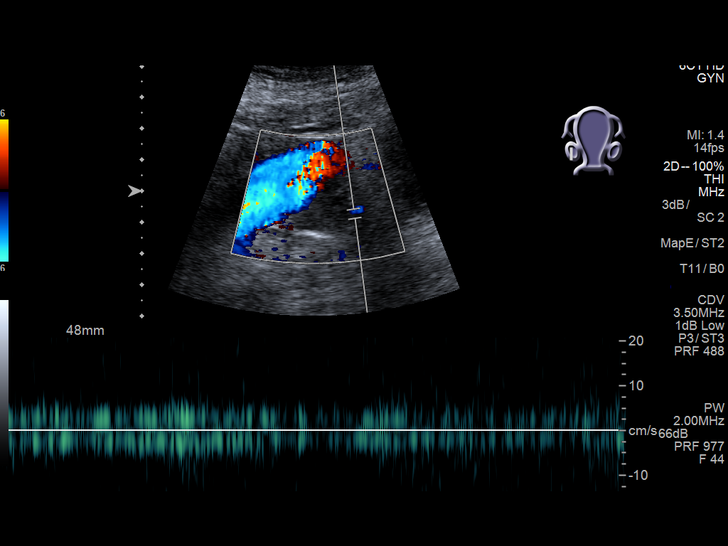
[im 46/122]
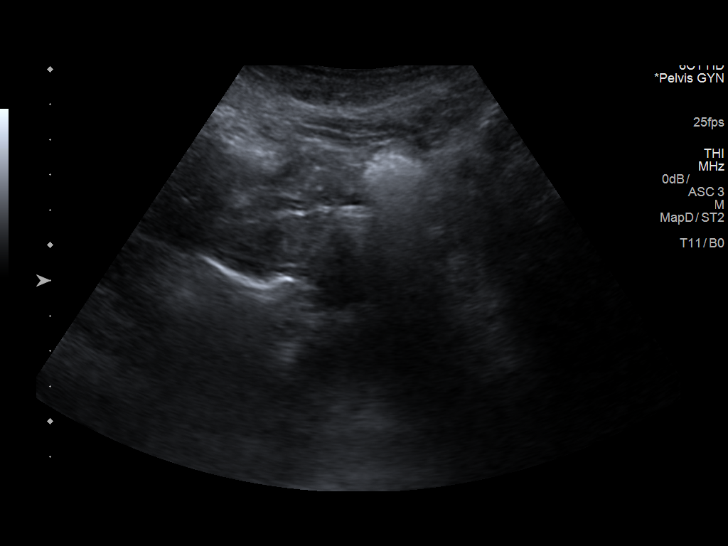
[im 56/122]
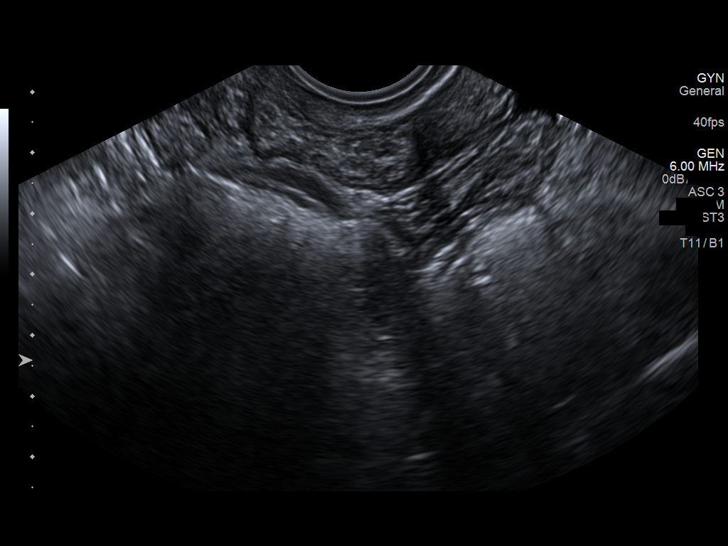
[im 66/122]
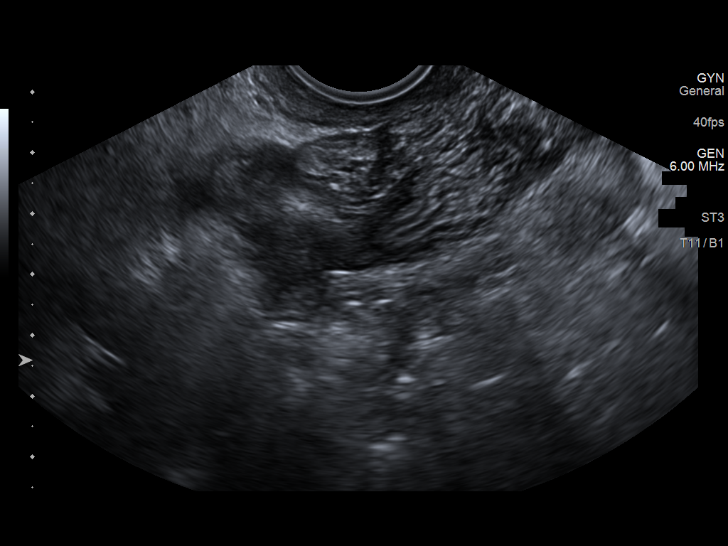
[im 76/122]
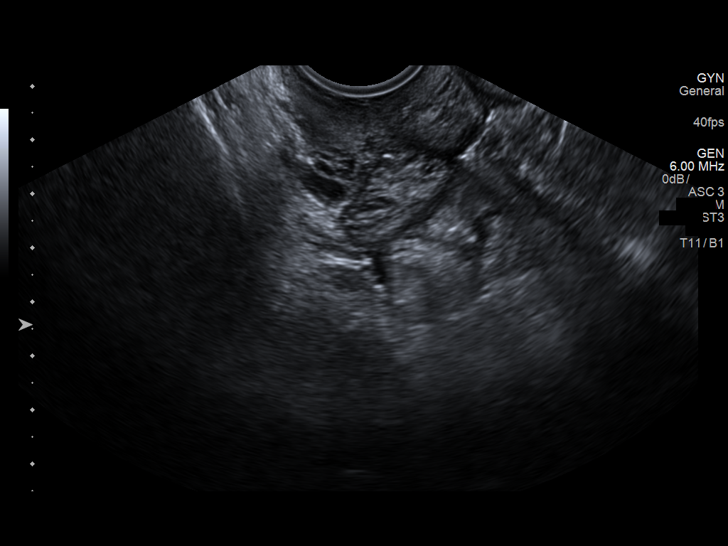
[im 81/122]
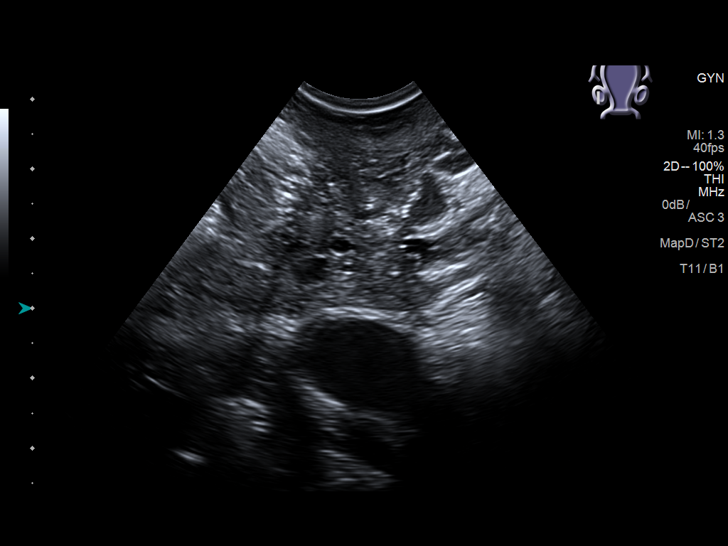
[im 91/122]
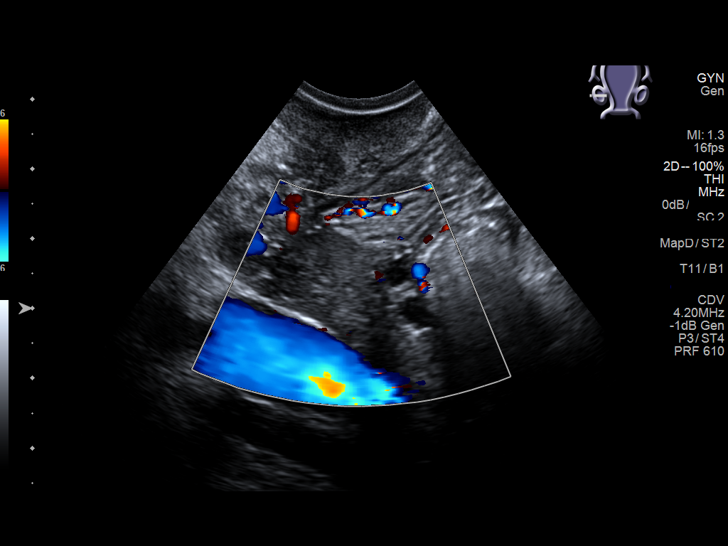
[im 101/122]
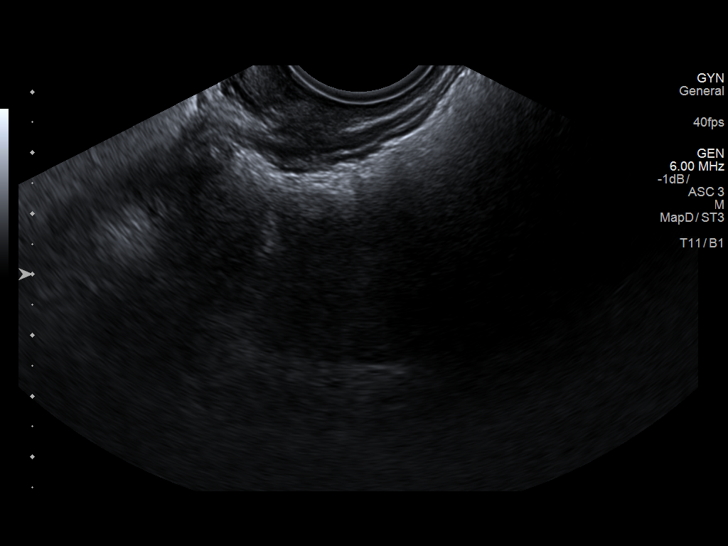
[im 111/122]
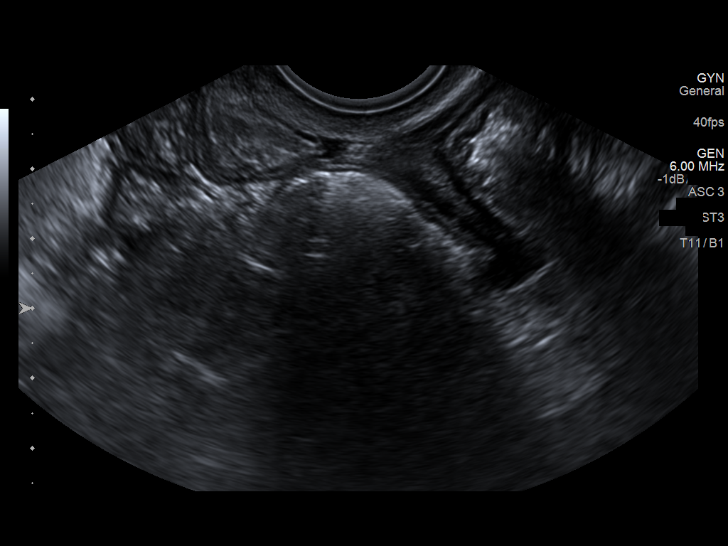
[im 122/122]
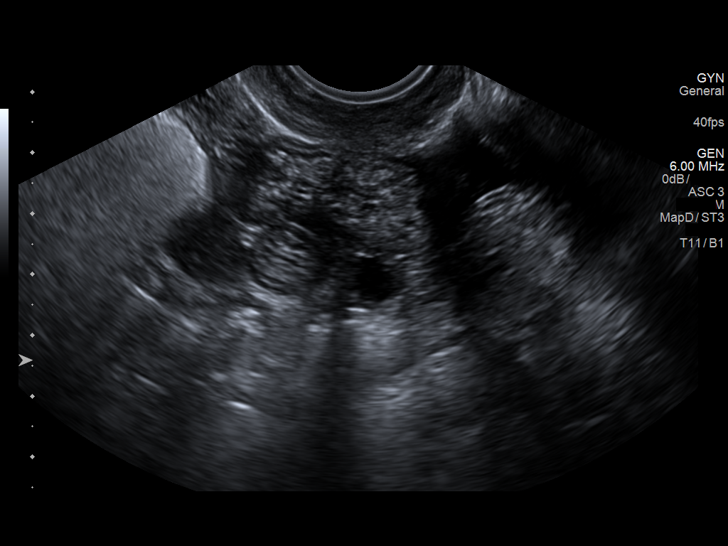

[14 of 25 positions shown; findings below may reference images not displayed]

FINDINGS: Uterus

Surgically absent

Endometrium

Not applicable

Right ovary

Measurements: 3.5 x 1.4 x 2.2 cm. Normal appearance/no adnexal mass.

Left ovary

Not visualized due to bowel.

Pulsed Doppler evaluation of the right ovary demonstrates normal
low-resistance arterial and venous waveforms.

Other findings

Trace physiologic free fluid.
IMPRESSION: Status post hysterectomy. The left ovary was not visualized due to
overlying bowel. The right ovary is unremarkable. No adnexal mass is
seen.

## 2018-07-28 NOTE — Telephone Encounter (Addendum)
Pt has update her phone number and she is aware form was faxed. Pt would like to pick up a copy

## 2019-01-08 ENCOUNTER — Emergency Department (HOSPITAL_COMMUNITY)
Admission: EM | Admit: 2019-01-08 | Discharge: 2019-01-08 | Disposition: A | Discharge status: Home / Self Care | Payer: 59 | Attending: Emergency Medicine | Admitting: Emergency Medicine

## 2019-01-08 ENCOUNTER — Other Ambulatory Visit: Payer: Self-pay

## 2019-01-08 ENCOUNTER — Encounter (HOSPITAL_COMMUNITY): Payer: Self-pay | Admitting: Emergency Medicine

## 2019-01-08 DIAGNOSIS — M545 Low back pain, unspecified: Secondary | ICD-10-CM

## 2019-01-08 DIAGNOSIS — Y9389 Activity, other specified: Secondary | ICD-10-CM | POA: Diagnosis not present

## 2019-01-08 DIAGNOSIS — Y929 Unspecified place or not applicable: Secondary | ICD-10-CM | POA: Insufficient documentation

## 2019-01-08 DIAGNOSIS — Y33XXXA Other specified events, undetermined intent, initial encounter: Secondary | ICD-10-CM | POA: Insufficient documentation

## 2019-01-08 DIAGNOSIS — S39012A Strain of muscle, fascia and tendon of lower back, initial encounter: Secondary | ICD-10-CM | POA: Insufficient documentation

## 2019-01-08 DIAGNOSIS — Y998 Other external cause status: Secondary | ICD-10-CM | POA: Insufficient documentation

## 2019-01-08 DIAGNOSIS — F17210 Nicotine dependence, cigarettes, uncomplicated: Secondary | ICD-10-CM | POA: Diagnosis not present

## 2019-01-08 DIAGNOSIS — T148XXA Other injury of unspecified body region, initial encounter: Secondary | ICD-10-CM

## 2019-01-08 MED ORDER — CYCLOBENZAPRINE HCL 10 MG PO TABS: 10.0000 mg | ORAL_TABLET | Freq: Two times a day (BID) | ORAL | 0 refills | Status: DC | PRN

## 2019-01-08 MED ORDER — CYCLOBENZAPRINE HCL 10 MG PO TABS
10.0000 mg | ORAL_TABLET | Freq: Once | ORAL | Status: AC
  Administered 2019-01-08: 10 mg via ORAL
  Filled 2019-01-08: qty 1

## 2019-01-08 MED ORDER — LIDOCAINE 5 % EX PTCH: 1.0000 | MEDICATED_PATCH | CUTANEOUS | 0 refills | Status: DC

## 2019-01-08 NOTE — ED Notes (Signed)
Pt was found in waiting room, brought to room 6

## 2019-01-08 NOTE — ED Notes (Signed)
Patient verbalizes understanding of discharge instructions. Opportunity for questioning and answers were provided. Armband removed by staff, pt discharged from ED. Ambulated out to lobby  

## 2019-01-08 NOTE — ED Provider Notes (Signed)
[facility] Emergency Department Provider Note MRN:  [id number]  Arrival date & time: 01/08/19     Chief Complaint   Back Pain   History of Present Illness   Haley Garza is a 43 y.o. year-old female with a history of tobacco abuse presenting to the ED with chief complaint of back pain.  Sudden onset back pain that occurred yesterday afternoon.  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  Was initially mild to moderate.  Shortly afterwards, she sat down, and then attempted to turn her torso to the left, and experienced acute severe worsening of the pain in the same location.  Pain is worse with moving, has been constant.  Denies numbness weakness to the arms or legs, no bowel or bladder dysfunction, denies fever, denies IV drug use.  No other symptoms, denies trauma.  Review of Systems  A complete 10 system review of systems was obtained and all systems are negative except as noted in the HPI and PMH.   Patient's Health History    Past Medical History:  Diagnosis Date  . Anemia   . Anxiety   . Arthritis   . Depression   . History of kidney stones   . IFG (impaired fasting glucose)   . Obesity   . PONV (postoperative nausea and vomiting)   . Tobacco abuse     Past Surgical History:  Procedure Laterality Date  . BARIATRIC SURGERY    . CESAREAN SECTION     x 2   . CYSTOSCOPY N/A 06/18/2017   Procedure: CYSTOSCOPY;  Surgeon: [name], MD;  Location: [facility] ORS;  Service: Gynecology;  Laterality: N/A;  . INTRAUTERINE DEVICE (IUD) INSERTION     feb 2014  . KNEE ARTHROSCOPY Right   . LAPAROSCOPIC HYSTERECTOMY N/A 06/18/2017   Procedure: HYSTERECTOMY TOTAL LAPAROSCOPIC;  Surgeon: [name], MD;  Location: [facility] ORS;  Service: Gynecology;  Laterality: N/A;  . TUBAL LIGATION    . UTERINE FIBROID EMBOLIZATION  01/09/2017   Triangular vascular    Family History   Problem Relation Age of Onset  . Hypertension Mother   . Hyperlipidemia Mother   . Depression Mother   . Fibroids Mother     Social History   Socioeconomic History  . Marital status: Married    Spouse name: Not on file  . Number of children: Not on file  . Years of education: Not on file  . Highest education level: Not on file  Occupational History  . Not on file  Social Needs  . Financial resource strain: Not on file  . Food insecurity:    Worry: Not on file    Inability: Not on file  . Transportation needs:    Medical: Not on file    Non-medical: Not on file  Tobacco Use  . Smoking status: Current Every Day Smoker    Packs/day: 0.50    Types: Cigarettes  . Smokeless tobacco: Never Used  Substance and Sexual Activity  . Alcohol use: No  . Drug use: No  . Sexual activity: Not Currently    Birth control/protection: None  Lifestyle  . Physical activity:    Days per week: Not on file    Minutes per session: Not on file  . Stress: Not on file  Relationships  . Social connections:    Talks on phone: Not on file    Gets together: Not on file  Attends religious service: Not on file    Active member of club or organization: Not on file    Attends meetings of clubs or organizations: Not on file    Relationship status: Not on file  . Intimate partner violence:    Fear of current or ex partner: Not on file    Emotionally abused: Not on file    Physically abused: Not on file    Forced sexual activity: Not on file  Other Topics Concern  . Not on file  Social History Narrative  . Not on file     Physical Exam  Vital Signs and Nursing Notes reviewed Vitals:   01/08/19 1049  BP: 114/74  Pulse: 60  Resp: 18  Temp: 98.4 F (36.9 C)  SpO2: 100%    CONSTITUTIONAL: Well-appearing, NAD NEURO:  Alert and oriented x 3, no focal deficits EYES:  eyes equal and reactive ENT/NECK:  no LAD, no JVD CARDIO: Regular rate, well-perfused, normal S1 and S2 PULM:  CTAB no  wheezing or rhonchi GI/GU:  normal bowel sounds, non-distended, non-tender; no CVA tenderness MSK/SPINE:  No gross deformities, no edema; tenderness palpation to the left lumbar back SKIN:  no rash, atraumatic PSYCH:  Appropriate speech and behavior  Diagnostic and Interventional Summary    Labs Reviewed - No data to display  No orders to display    Medications  cyclobenzaprine (FLEXERIL) tablet 10 mg (has no administration in time range)     Procedures Critical Care  ED Course and Medical Decision Making  I have reviewed the triage vital signs and the nursing notes.  Pertinent labs & imaging results that were available during my care of the patient were reviewed by me and considered in my medical decision making (see below for details).  Consistent with muscle sprain or spasm of the back, no radiation to the legs, no neurological deficit, no bowel or bladder dysfunction, nothing to suggest radiculopathy or myelopathy.  Pain is worse with moving, no urinary symptoms, doubt kidney stone.  Prescription for Flexeril.  After the discussed management above, the patient was determined to be safe for discharge.  The patient was in agreement with this plan and all questions regarding their care were answered.  ED return precautions were discussed and the patient will return to the ED with any significant worsening of condition.  Barth Kirks. Sedonia Small, Monette [email or web address]  Final Clinical Impressions(s) / ED Diagnoses     ICD-10-CM   1. Muscle strain T14.8XXA   2. Acute left-sided low back pain without sciatica M54.5     ED Discharge Orders         Ordered    cyclobenzaprine (FLEXERIL) 10 MG tablet  2 times daily PRN     01/08/19 1210    lidocaine (LIDODERM) 5 %  Every 24 hours     01/08/19 1210             Maudie Flakes, MD 01/08/19 1214

## 2019-01-08 NOTE — Discharge Instructions (Addendum)
You were evaluated in the Emergency Department and after careful evaluation, we did not find any emergent condition requiring admission or further testing in the hospital.  Your symptoms today seem to be due to muscle sprain or spasm of the back.  Please use Tylenol or ibuprofen during the day for pain.  You can also use the Lidoderm numbing patches during the day to help with the pain.  You can use the Flexeril muscle relaxer medication at night if you are having trouble sleeping due to the pain.  Please return to the Emergency Department if you experience any worsening of your condition.  We encourage you to follow up with a primary care provider.  Thank you for allowing [place] to be a part of your care.

## 2019-01-08 NOTE — ED Triage Notes (Signed)
Left lower back shooting down hip to leg to middle of back. Can ambulate but hurts. No incontinence. This occurred yesterday when bending over.

## 2019-01-08 NOTE — ED Notes (Signed)
No answer when called in waiting room. 

## 2019-01-12 ENCOUNTER — Encounter: Payer: Self-pay | Admitting: Family Medicine

## 2019-01-12 ENCOUNTER — Other Ambulatory Visit: Payer: Self-pay

## 2019-01-12 ENCOUNTER — Ambulatory Visit (INDEPENDENT_AMBULATORY_CARE_PROVIDER_SITE_OTHER): Payer: 59 | Admitting: Family Medicine

## 2019-01-12 VITALS — BP 103/49 | HR 54 | Temp 97.8°F

## 2019-01-12 DIAGNOSIS — S39012D Strain of muscle, fascia and tendon of lower back, subsequent encounter: Secondary | ICD-10-CM

## 2019-01-12 MED ORDER — KETOROLAC TROMETHAMINE 60 MG/2ML IM SOLN
60.0000 mg | Freq: Once | INTRAMUSCULAR | Status: AC
  Administered 2019-01-12: 60 mg via INTRAMUSCULAR

## 2019-01-12 NOTE — Progress Notes (Signed)
BP (!) 103/49 (BP Location: Left Arm, Patient Position: Sitting, Cuff Size: Normal)   Pulse (!) 54   Temp 97.8 F (36.6 C)   LMP 05/27/2017 Comment: preg test neg  SpO2 100%    Subjective:    Patient ID: Haley Garza, female    DOB: 24-Mar-1976, 43 y.o.   MRN: [id number]  HPI: Haley Garza is a 43 y.o. female  Chief Complaint  Patient presents with  . 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 Flexeril and ibuprofen are helping when she takes them, but the flexeril makes her sleepy so can't take them on nights where she works the next day. Has not taken anything today so feels very stiff and sore but typically when taking the medicine she does feel slow improvement. Denies radiation down leg, numbness, tingling, weakness. No known hx of back issues.   Relevant past medical, surgical, family and social history reviewed and updated as indicated. Interim medical history since our last visit reviewed. Allergies and medications reviewed and updated.  Review of Systems  Per HPI unless specifically indicated above     Objective:    BP (!) 103/49 (BP Location: Left Arm, Patient Position: Sitting, Cuff Size: Normal)   Pulse (!) 54   Temp 97.8 F (36.6 C)   LMP 05/27/2017 Comment: preg test neg  SpO2 100%   Wt Readings from Last 3 Encounters:  01/08/19 155 lb (70.3 kg)  04/01/18 155 lb 6.4 oz (70.5 kg)  11/11/17 160 lb (72.6 kg)    Physical Exam Vitals signs and nursing note reviewed.  Constitutional:      Appearance: Normal appearance. She is not ill-appearing.  HENT:     Head: Atraumatic.  Eyes:     Extraocular Movements: Extraocular movements intact.     Conjunctiva/sclera: Conjunctivae normal.  Neck:     Musculoskeletal: Normal range of motion and neck supple.  Cardiovascular:     Rate and Rhythm: Normal rate and regular rhythm.     Heart  sounds: Normal heart sounds.  Pulmonary:     Effort: Pulmonary effort is normal.     Breath sounds: Normal breath sounds.  Musculoskeletal: Normal range of motion.        General: Tenderness (left lumbar paraspinal muscles ttp) present.     Comments: - SLR b/l Strength full and equal all 4 extremities  Skin:    General: Skin is warm and dry.  Neurological:     Mental Status: She is alert and oriented to person, place, and time.  Psychiatric:        Mood and Affect: Mood normal.        Thought Content: Thought content normal.        Judgment: Judgment normal.     Results for orders placed or performed during the hospital encounter of 11/05/17  Chlamydia/NGC rt PCR ([facility] only)  Result Value Ref Range   Specimen source GC/Chlam VAGINA    Chlamydia Tr NOT DETECTED NOT DETECTED   N gonorrhoeae NOT DETECTED NOT DETECTED  Wet prep, genital  Result Value Ref Range   Yeast Wet Prep HPF POC NONE SEEN NONE SEEN   Trich, Wet Prep NONE SEEN NONE SEEN   Clue Cells Wet Prep HPF POC NONE SEEN NONE SEEN   WBC, Wet Prep HPF POC FEW (A) NONE  SEEN   Sperm NONE SEEN   Comprehensive metabolic panel  Result Value Ref Range   Sodium 139 135 - 145 mmol/L   Potassium 3.7 3.5 - 5.1 mmol/L   Chloride 107 101 - 111 mmol/L   CO2 25 22 - 32 mmol/L   Glucose, Bld 95 65 - 99 mg/dL   BUN 9 6 - 20 mg/dL   Creatinine, Ser 0.69 0.44 - 1.00 mg/dL   Calcium 8.6 (L) 8.9 - 10.3 mg/dL   Total Protein 6.8 6.5 - 8.1 g/dL   Albumin 3.9 3.5 - 5.0 g/dL   AST 22 15 - 41 U/L   ALT 27 14 - 54 U/L   Alkaline Phosphatase 62 38 - 126 U/L   Total Bilirubin 0.9 0.3 - 1.2 mg/dL   GFR calc non Af Amer >60 >60 mL/min   GFR calc Af Amer >60 >60 mL/min   Anion gap 7 5 - 15  Troponin I  Result Value Ref Range   Troponin I <0.03 <0.03 ng/mL  Lactic acid, plasma  Result Value Ref Range   Lactic Acid, Venous 0.9 0.5 - 1.9 mmol/L  CBC with Differential  Result Value Ref Range   WBC 6.5 3.6 - 11.0 K/uL   RBC 3.79 (L) 3.80  - 5.20 MIL/uL   Hemoglobin 11.4 (L) 12.0 - 16.0 g/dL   HCT 34.8 (L) 35.0 - 47.0 %   MCV 91.7 80.0 - 100.0 fL   MCH 30.1 26.0 - 34.0 pg   MCHC 32.9 32.0 - 36.0 g/dL   RDW 18.3 (H) 11.5 - 14.5 %   Platelets 252 150 - 440 K/uL   Neutrophils Relative % 56 %   Neutro Abs 3.7 1.4 - 6.5 K/uL   Lymphocytes Relative 28 %   Lymphs Abs 1.8 1.0 - 3.6 K/uL   Monocytes Relative 9 %   Monocytes Absolute 0.6 0.2 - 0.9 K/uL   Eosinophils Relative 6 %   Eosinophils Absolute 0.4 0 - 0.7 K/uL   Basophils Relative 1 %   Basophils Absolute 0.0 0 - 0.1 K/uL  Urinalysis, Complete w Microscopic  Result Value Ref Range   Color, Urine AMBER (A) YELLOW   APPearance CLEAR (A) CLEAR   Specific Gravity, Urine 1.019 1.005 - 1.030   pH 5.0 5.0 - 8.0   Glucose, UA NEGATIVE NEGATIVE mg/dL   Hgb urine dipstick NEGATIVE NEGATIVE   Bilirubin Urine NEGATIVE NEGATIVE   Ketones, ur NEGATIVE NEGATIVE mg/dL   Protein, ur NEGATIVE NEGATIVE mg/dL   Nitrite NEGATIVE NEGATIVE   Leukocytes, UA NEGATIVE NEGATIVE   RBC / HPF 0-5 0 - 5 RBC/hpf   WBC, UA 0-5 0 - 5 WBC/hpf   Bacteria, UA NONE SEEN NONE SEEN   Squamous Epithelial / LPF 0-5 (A) NONE SEEN   Mucus PRESENT       Assessment & Plan:   Problem List Items Addressed This Visit    None    Visit Diagnoses    Back strain, subsequent encounter    -  Primary   IM toradol given today, continue flexeril (may cut in half if too sedating), stretches, heat. Light duty at work (lifting restrictions) through end of week   Relevant Medications   ketorolac (TORADOL) injection 60 mg (Completed)       Follow up plan: Return if symptoms worsen or fail to improve.

## 2019-03-11 ENCOUNTER — Telehealth: Payer: 59 | Admitting: Nurse Practitioner

## 2019-03-11 DIAGNOSIS — J069 Acute upper respiratory infection, unspecified: Secondary | ICD-10-CM | POA: Diagnosis not present

## 2019-03-11 DIAGNOSIS — B9789 Other viral agents as the cause of diseases classified elsewhere: Secondary | ICD-10-CM | POA: Diagnosis not present

## 2019-03-11 MED ORDER — FLUTICASONE PROPIONATE 50 MCG/ACT NA SUSP: 2.0000 | Freq: Every day | NASAL | 6 refills | Status: DC

## 2019-03-11 MED ORDER — BENZONATATE 100 MG PO CAPS: 100.0000 mg | ORAL_CAPSULE | Freq: Three times a day (TID) | ORAL | 0 refills | Status: DC | PRN

## 2019-03-11 NOTE — Progress Notes (Signed)
We are sorry you are not feeling well.  Here is how we plan to help!  Based on what you have shared with me, it looks like you may have a viral upper respiratory infection.  Upper respiratory infections are caused by a large number of viruses; however, rhinovirus is the most common cause.   Symptoms vary from person to person, with common symptoms including sore throat, cough, and fatigue or lack of energy.  A low-grade fever of up to 100.4 may present, but is often uncommon.  Symptoms vary however, and are closely related to a person's age or underlying illnesses.  The most common symptoms associated with an upper respiratory infection are nasal discharge or congestion, cough, sneezing, headache and pressure in the ears and face.  These symptoms usually persist for about 3 to 10 days, but can last up to 2 weeks.  It is important to know that upper respiratory infections do not cause serious illness or complications in most cases.    Upper respiratory infections can be transmitted from person to person, with the most common method of transmission being a person's hands.  The virus is able to live on the skin and can infect other persons for up to 2 hours after direct contact.  Also, these can be transmitted when someone coughs or sneezes; thus, it is important to cover the mouth to reduce this risk.  To keep the spread of the illness at bay, good hand hygiene is very important.  This is an infection that is most likely caused by a virus. There are no specific treatments other than to help you with the symptoms until the infection runs its course.  We are sorry you are not feeling well.  Here is how we plan to help!   For nasal congestion, you may use an oral decongestants such as Mucinex D or if you have glaucoma or high blood pressure use plain Mucinex.  Saline nasal spray or nasal drops can help and can safely be used as often as needed for congestion.  For your congestion, I have prescribed Fluticasone  nasal spray one spray in each nostril twice a day  If you do not have a history of heart disease, hypertension, diabetes or thyroid disease, prostate/bladder issues or glaucoma, you may also use Sudafed to treat nasal congestion.  It is highly recommended that you consult with a pharmacist or your primary care physician to ensure this medication is safe for you to take.     If you have a cough, you may use cough suppressants such as Delsym and Robitussin.  If you have glaucoma or high blood pressure, you can also use Coricidin HBP.   For cough I have prescribed for you A prescription cough medication called Tessalon Perles 100 mg. You may take 1-2 capsules every 8 hours as needed for cough  If you have a sore or scratchy throat, use a saltwater gargle-  to  teaspoon of salt dissolved in a 4-ounce to 8-ounce glass of warm water.  Gargle the solution for approximately 15-30 seconds and then spit.  It is important not to swallow the solution.  You can also use throat lozenges/cough drops and Chloraseptic spray to help with throat pain or discomfort.  Warm or cold liquids can also be helpful in relieving throat pain.  For headache, pain or general discomfort, you can use Ibuprofen or Tylenol as directed.   Some authorities believe that zinc sprays or the use of Echinacea may shorten the   course of your symptoms.   HOME CARE . Only take medications as instructed by your medical team. . Be sure to drink plenty of fluids. Water is fine as well as fruit juices, sodas and electrolyte beverages. You may want to stay away from caffeine or alcohol. If you are nauseated, try taking small sips of liquids. How do you know if you are getting enough fluid? Your urine should be a pale yellow or almost colorless. . Get rest. . Taking a steamy shower or using a humidifier may help nasal congestion and ease sore throat pain. You can place a towel over your head and breathe in the steam from hot water coming from a  faucet. . Using a saline nasal spray works much the same way. . Cough drops, hard candies and sore throat lozenges may ease your cough. . Avoid close contacts especially the very young and the elderly . Cover your mouth if you cough or sneeze . Always remember to wash your hands.   GET HELP RIGHT AWAY IF: . You develop worsening fever. . If your symptoms do not improve within 10 days . You develop yellow or green discharge from your nose over 3 days. . You have coughing fits . You develop a severe head ache or visual changes. . You develop shortness of breath, difficulty breathing or start having chest pain . Your symptoms persist after you have completed your treatment plan  MAKE SURE YOU   Understand these instructions.  Will watch your condition.  Will get help right away if you are not doing well or get worse.  Your e-visit answers were reviewed by a board certified advanced clinical practitioner to complete your personal care plan. Depending upon the condition, your plan could have included both over the counter or prescription medications. Please review your pharmacy choice. If there is a problem, you may call our nursing hot line at and have the prescription routed to another pharmacy. Your safety is important to [place]. If you have drug allergies check your prescription carefully.   You can use MyChart to ask questions about today's visit, request a non-urgent call back, or ask for a work or school excuse for 24 hours related to this e-Visit. If it has been greater than 24 hours you will need to follow up with your provider, or enter a new e-Visit to address those concerns. You will get an e-mail in the next two days asking about your experience.  I hope that your e-visit has been valuable and will speed your recovery. Thank you for using e-visits.    5-10 minutes spent reviewing and documenting in chart.

## 2019-06-17 ENCOUNTER — Other Ambulatory Visit: Payer: Self-pay

## 2019-06-17 ENCOUNTER — Ambulatory Visit (INDEPENDENT_AMBULATORY_CARE_PROVIDER_SITE_OTHER): Payer: 59 | Admitting: Family Medicine

## 2019-06-17 ENCOUNTER — Encounter: Payer: Self-pay | Admitting: Family Medicine

## 2019-06-17 DIAGNOSIS — M25511 Pain in right shoulder: Secondary | ICD-10-CM | POA: Diagnosis not present

## 2019-06-17 DIAGNOSIS — R5382 Chronic fatigue, unspecified: Secondary | ICD-10-CM

## 2019-06-17 DIAGNOSIS — M791 Myalgia, unspecified site: Secondary | ICD-10-CM | POA: Diagnosis not present

## 2019-06-17 NOTE — Progress Notes (Signed)
LMP 05/27/2017 Comment: preg test neg   Subjective:    Patient ID: Haley Garza, female    DOB: 1976-03-25, 43 y.o.   MRN: [id number]  HPI: Haley Garza is a 43 y.o. female  Chief Complaint  Patient presents with  . Headache    muscle ache and fatigue, patient went to [facility] and was tested for COVID. Checked care everywhere, negative COVID- unsure if patient has been notified by [facility] yet   Has been having some nausea and light headed. Has been having some muscle aches. This has been going on for about 3 weeks. Has been feeling tired. Has been taking her vitamins like she is supposed to. She went to [facility] urgent care and got tested for COVID- thankfully she was negative. She has not been running a fever. No SOB. Just really feeling tired and nauseous and light headed. No other concerns or complaints at this time.   Relevant past medical, surgical, family and social history reviewed and updated as indicated. Interim medical history since our last visit reviewed. Allergies and medications reviewed and updated.  Review of Systems  Constitutional: Positive for activity change and fatigue. Negative for appetite change, chills, diaphoresis, fever and unexpected weight change.  HENT: Negative.   Respiratory: Negative.   Cardiovascular: Negative.   Gastrointestinal: Positive for nausea. Negative for abdominal distention, abdominal pain, anal bleeding, blood in stool, constipation, diarrhea, rectal pain and vomiting.  Musculoskeletal: Positive for arthralgias and myalgias. Negative for back pain, gait problem, joint swelling, neck pain and neck stiffness.  Skin: Negative.   Neurological: Positive for dizziness, light-headedness and headaches. Negative for tremors, seizures, syncope, facial asymmetry, speech difficulty, weakness and numbness.  Psychiatric/Behavioral: Negative.     Per HPI unless specifically indicated above     Objective:    LMP 05/27/2017 Comment: preg test neg  Wt Readings  from Last 3 Encounters:  01/08/19 155 lb (70.3 kg)  04/01/18 155 lb 6.4 oz (70.5 kg)  11/11/17 160 lb (72.6 kg)    Physical Exam Vitals signs and nursing note reviewed.  Constitutional:      General: She is not in acute distress.    Appearance: Normal appearance. She is not ill-appearing, toxic-appearing or diaphoretic.  HENT:     Head: Normocephalic and atraumatic.     Right Ear: External ear normal.     Left Ear: External ear normal.     Nose: Nose normal.     Mouth/Throat:     Mouth: Mucous membranes are moist.     Pharynx: Oropharynx is clear.  Eyes:     General: No scleral icterus.       Right eye: No discharge.        Left eye: No discharge.     Conjunctiva/sclera: Conjunctivae normal.     Pupils: Pupils are equal, round, and reactive to light.  Neck:     Musculoskeletal: Normal range of motion.  Pulmonary:     Effort: Pulmonary effort is normal. No respiratory distress.     Comments: Speaking in full sentences Musculoskeletal: Normal range of motion.  Skin:    Coloration: Skin is not jaundiced or pale.     Findings: No bruising, erythema, lesion or rash.  Neurological:     Mental Status: She is alert and oriented to person, place, and time. Mental status is at baseline.  Psychiatric:        Mood and Affect: Mood normal.        Behavior: Behavior normal.  Thought Content: Thought content normal.        Judgment: Judgment normal.     Results for orders placed or performed during the hospital encounter of 11/05/17  [place] rt PCR ([place] only)   Specimen: Cervical/Vaginal swab  Result Value Ref Range   Specimen source GC/Chlam VAGINA    Chlamydia Tr NOT DETECTED NOT DETECTED   N gonorrhoeae NOT DETECTED NOT DETECTED  Wet prep, genital   Specimen: Cervical/Vaginal swab  Result Value Ref Range   Yeast Wet Prep HPF POC NONE SEEN NONE SEEN   Trich, Wet Prep NONE SEEN NONE SEEN   Clue Cells Wet Prep HPF POC NONE SEEN NONE SEEN   WBC, Wet Prep HPF POC  FEW (A) NONE SEEN   Sperm NONE SEEN   Comprehensive metabolic panel  Result Value Ref Range   Sodium 139 135 - 145 mmol/L   Potassium 3.7 3.5 - 5.1 mmol/L   Chloride 107 101 - 111 mmol/L   CO2 25 22 - 32 mmol/L   Glucose, Bld 95 65 - 99 mg/dL   BUN 9 6 - 20 mg/dL   Creatinine, Ser 0.69 0.44 - 1.00 mg/dL   Calcium 8.6 (L) 8.9 - 10.3 mg/dL   Total Protein 6.8 6.5 - 8.1 g/dL   Albumin 3.9 3.5 - 5.0 g/dL   AST 22 15 - 41 U/L   ALT 27 14 - 54 U/L   Alkaline Phosphatase 62 38 - 126 U/L   Total Bilirubin 0.9 0.3 - 1.2 mg/dL   GFR calc non Af Amer >60 >60 mL/min   GFR calc Af Amer >60 >60 mL/min   Anion gap 7 5 - 15  Troponin I  Result Value Ref Range   Troponin I <0.03 <0.03 ng/mL  Lactic acid, plasma  Result Value Ref Range   Lactic Acid, Venous 0.9 0.5 - 1.9 mmol/L  CBC with Differential  Result Value Ref Range   WBC 6.5 3.6 - 11.0 K/uL   RBC 3.79 (L) 3.80 - 5.20 MIL/uL   Hemoglobin 11.4 (L) 12.0 - 16.0 g/dL   HCT 34.8 (L) 35.0 - 47.0 %   MCV 91.7 80.0 - 100.0 fL   MCH 30.1 26.0 - 34.0 pg   MCHC 32.9 32.0 - 36.0 g/dL   RDW 18.3 (H) 11.5 - 14.5 %   Platelets 252 150 - 440 K/uL   Neutrophils Relative % 56 %   Neutro Abs 3.7 1.4 - 6.5 K/uL   Lymphocytes Relative 28 %   Lymphs Abs 1.8 1.0 - 3.6 K/uL   Monocytes Relative 9 %   Monocytes Absolute 0.6 0.2 - 0.9 K/uL   Eosinophils Relative 6 %   Eosinophils Absolute 0.4 0 - 0.7 K/uL   Basophils Relative 1 %   Basophils Absolute 0.0 0 - 0.1 K/uL  Urinalysis, Complete w Microscopic  Result Value Ref Range   Color, Urine AMBER (A) YELLOW   APPearance CLEAR (A) CLEAR   Specific Gravity, Urine 1.019 1.005 - 1.030   pH 5.0 5.0 - 8.0   Glucose, UA NEGATIVE NEGATIVE mg/dL   Hgb urine dipstick NEGATIVE NEGATIVE   Bilirubin Urine NEGATIVE NEGATIVE   Ketones, ur NEGATIVE NEGATIVE mg/dL   Protein, ur NEGATIVE NEGATIVE mg/dL   Nitrite NEGATIVE NEGATIVE   Leukocytes, UA NEGATIVE NEGATIVE   RBC / HPF 0-5 0 - 5 RBC/hpf   WBC, UA 0-5  0 - 5 WBC/hpf   Bacteria, UA NONE SEEN NONE SEEN   Squamous Epithelial /  LPF 0-5 (A) NONE SEEN   Mucus PRESENT       Assessment & Plan:   Problem List Items Addressed This Visit    None    Visit Diagnoses    Pain in joint of right shoulder    -  Primary   Has not been feeling great. Will check labs. Await results. Naproxen sent to her pharmacy. Call with any concerns.    Relevant Orders   VITAMIN D 25 Hydroxy (Vit-D Deficiency, Fractures)   TSH   C-reactive protein   Antinuclear Antib (ANA)   Sed Rate (ESR)   RA Qn+CCP(IgG/A)+SjoSSA+SjoSSB   Lyme Ab/Western Blot Reflex   Babesia microti Antibody Panel   Ehrlichia Antibody Panel   Rocky mtn spotted fvr abs pnl(IgG+IgM)   CBC with Differential/Platelet   Myalgia       Has not been feeling great. Will check labs. Await results. Naproxen sent to her pharmacy. Call with any concerns.    Relevant Orders   VITAMIN D 25 Hydroxy (Vit-D Deficiency, Fractures)   TSH   C-reactive protein   Antinuclear Antib (ANA)   Sed Rate (ESR)   RA Qn+CCP(IgG/A)+SjoSSA+SjoSSB   Lyme Ab/Western Blot Reflex   Babesia microti Antibody Panel   Ehrlichia Antibody Panel   Rocky mtn spotted fvr abs pnl(IgG+IgM)   CBC with Differential/Platelet   Chronic fatigue       Has not been feeling great. Will check labs. Await results. Naproxen sent to her pharmacy. Call with any concerns.    Relevant Orders   VITAMIN D 25 Hydroxy (Vit-D Deficiency, Fractures)   TSH   C-reactive protein   Antinuclear Antib (ANA)   Sed Rate (ESR)   RA Qn+CCP(IgG/A)+SjoSSA+SjoSSB   Lyme Ab/Western Blot Reflex   Babesia microti Antibody Panel   Ehrlichia Antibody Panel   Rocky mtn spotted fvr abs pnl(IgG+IgM)   CBC with Differential/Platelet       Follow up plan: Return in about 4 weeks (around 07/15/2019) for Physical.   . This visit was completed via [employer] due to the restrictions of the COVID-19 pandemic. All issues as above were discussed and addressed. Physical  exam was done as above through visual confirmation on [employer]. If it was felt that the patient should be evaluated in the office, they were directed there. The patient verbally consented to this visit. . Location of the patient: home . Location of the provider: home . Those involved with this call:  . Provider: Park Liter, DO . CMA: Tiffany Reel, CMA . Front Desk/Registration: Don Perking  . Time spent on call: 15 minutes with patient face to face via video conference. More than 50% of this time was spent in counseling and coordination of care. 23 minutes total spent in review of patient's record and preparation of their chart.

## 2019-06-18 ENCOUNTER — Ambulatory Visit: Payer: 59 | Admitting: Family Medicine

## 2019-06-19 ENCOUNTER — Other Ambulatory Visit: Payer: 59

## 2019-06-19 ENCOUNTER — Other Ambulatory Visit: Payer: Self-pay

## 2019-06-22 ENCOUNTER — Ambulatory Visit: Payer: 59 | Admitting: Family Medicine

## 2019-06-24 ENCOUNTER — Other Ambulatory Visit: Payer: Self-pay

## 2019-06-24 ENCOUNTER — Telehealth: Payer: Self-pay | Admitting: Family Medicine

## 2019-06-24 ENCOUNTER — Other Ambulatory Visit: Payer: 59

## 2019-06-24 MED ORDER — NAPROXEN 500 MG PO TABS: 500.0000 mg | ORAL_TABLET | Freq: Two times a day (BID) | ORAL | 0 refills | Status: DC

## 2019-06-24 NOTE — Telephone Encounter (Signed)
Pt presented in office to do blood work, stating that previous provider she saw was going to call in pain medication but [facility] didn't receive it. Please advise.

## 2019-06-24 NOTE — Telephone Encounter (Signed)
I wasn't sending in any pain medicine- we were waiting on her blood work, which hasn't come back. I can send her in some naproxen. I'll send it in now for her, but not any other pain medicine.

## 2019-06-24 NOTE — Telephone Encounter (Signed)
Called and left a message for patient.

## 2019-06-26 LAB — TSH: TSH: 1.27 u[IU]/mL (ref 0.450–4.500)

## 2019-06-26 LAB — CBC WITH DIFFERENTIAL/PLATELET
Basophils Absolute: 0.1 10*3/uL (ref 0.0–0.2)
Basos: 1 %
EOS (ABSOLUTE): 0.3 10*3/uL (ref 0.0–0.4)
Eos: 4 %
Hematocrit: 41.6 % (ref 34.0–46.6)
Hemoglobin: 13.7 g/dL (ref 11.1–15.9)
Immature Grans (Abs): 0 10*3/uL (ref 0.0–0.1)
Immature Granulocytes: 0 %
Lymphocytes Absolute: 2 10*3/uL (ref 0.7–3.1)
Lymphs: 33 %
MCH: 33.2 pg — ABNORMAL HIGH (ref 26.6–33.0)
MCHC: 32.9 g/dL (ref 31.5–35.7)
MCV: 101 fL — ABNORMAL HIGH (ref 79–97)
Monocytes Absolute: 0.4 10*3/uL (ref 0.1–0.9)
Monocytes: 6 %
Neutrophils Absolute: 3.3 10*3/uL (ref 1.4–7.0)
Neutrophils: 56 %
Platelets: 218 10*3/uL (ref 150–450)
RBC: 4.13 x10E6/uL (ref 3.77–5.28)
RDW: 12.4 % (ref 11.7–15.4)
WBC: 6 10*3/uL (ref 3.4–10.8)

## 2019-06-26 LAB — EHRLICHIA ANTIBODY PANEL
E. Chaffeensis (HME) IgM Titer: NEGATIVE
E.Chaffeensis (HME) IgG: NEGATIVE
HGE IgG Titer: NEGATIVE
HGE IgM Titer: NEGATIVE

## 2019-06-26 LAB — BABESIA MICROTI ANTIBODY PANEL
Babesia microti IgG: <1:10 {titer}
Babesia microti IgM: <1:10 {titer}

## 2019-06-26 LAB — RA QN+CCP(IGG/A)+SJOSSA+SJOSSB
Cyclic Citrullin Peptide Ab: 3 units (ref 0–19)
ENA SSA (RO) Ab: <0.2 AI (ref 0.0–0.9)
ENA SSB (LA) Ab: <0.2 AI (ref 0.0–0.9)
Rheumatoid fact SerPl-aCnc: <10 IU/mL (ref 0.0–13.9)

## 2019-06-26 LAB — C-REACTIVE PROTEIN: CRP: <1 mg/L (ref 0–10)

## 2019-06-26 LAB — LYME AB/WESTERN BLOT REFLEX
LYME DISEASE AB, QUANT, IGM: <0.8 index (ref 0.00–0.79)
Lyme IgG/IgM Ab: <0.91 {ISR} (ref 0.00–0.90)

## 2019-06-26 LAB — ROCKY MTN SPOTTED FVR ABS PNL(IGG+IGM)
RMSF IgG: NEGATIVE
RMSF IgM: 0.92 index — ABNORMAL HIGH (ref 0.00–0.89)

## 2019-06-26 LAB — ANA: Anti Nuclear Antibody (ANA): NEGATIVE

## 2019-06-26 LAB — VITAMIN D 25 HYDROXY (VIT D DEFICIENCY, FRACTURES): Vit D, 25-Hydroxy: 27.7 ng/mL — ABNORMAL LOW (ref 30.0–100.0)

## 2019-06-26 LAB — SEDIMENTATION RATE: Sed Rate: 12 mm/hr (ref 0–32)

## 2019-07-01 ENCOUNTER — Other Ambulatory Visit: Payer: Self-pay | Admitting: Family Medicine

## 2019-07-01 DIAGNOSIS — Z1231 Encounter for screening mammogram for malignant neoplasm of breast: Secondary | ICD-10-CM

## 2019-07-21 ENCOUNTER — Ambulatory Visit
Admission: RE | Admit: 2019-07-21 | Discharge: 2019-07-21 | Disposition: A | Discharge status: Home / Self Care | Payer: 59 | Source: Ambulatory Visit | Attending: Family Medicine | Admitting: Family Medicine

## 2019-07-21 DIAGNOSIS — Z1231 Encounter for screening mammogram for malignant neoplasm of breast: Secondary | ICD-10-CM | POA: Diagnosis not present

## 2019-07-31 ENCOUNTER — Ambulatory Visit (INDEPENDENT_AMBULATORY_CARE_PROVIDER_SITE_OTHER): Payer: 59 | Admitting: Family Medicine

## 2019-07-31 ENCOUNTER — Other Ambulatory Visit: Payer: Self-pay

## 2019-07-31 ENCOUNTER — Encounter: Payer: Self-pay | Admitting: Family Medicine

## 2019-07-31 VITALS — BP 127/72 | HR 72 | Temp 98.5°F | Ht 66.02 in | Wt 161.4 lb

## 2019-07-31 DIAGNOSIS — Z720 Tobacco use: Secondary | ICD-10-CM | POA: Diagnosis not present

## 2019-07-31 DIAGNOSIS — Z23 Encounter for immunization: Secondary | ICD-10-CM

## 2019-07-31 DIAGNOSIS — Z0000 Encounter for general adult medical examination without abnormal findings: Secondary | ICD-10-CM | POA: Diagnosis not present

## 2019-07-31 MED ORDER — BUPROPION HCL ER (SR) 150 MG PO TB12: ORAL_TABLET | ORAL | 3 refills | Status: DC

## 2019-07-31 NOTE — Progress Notes (Signed)
BP 127/72   Pulse 72   Temp 98.5 F (36.9 C)   Ht 5' 6.02" (1.677 m)   Wt 161 lb 6 oz (73.2 kg)   LMP 05/27/2017 Comment: preg test neg  SpO2 100%   BMI 26.03 kg/m    Subjective:    Patient ID: Haley Garza, female    DOB: 10-Mar-1976, 43 y.o.   MRN: [id number]  HPI: Haley Garza is a 43 y.o. female presenting on 07/31/2019 for comprehensive medical examination. Current medical complaints include:  SMOKING CESSATION Smoking Status: current every day smoker Smoking Amount: 1/2ppd Smoking Onset: 43yo Smoking Quit Date: 08/30/19 Smoking triggers: after eating, driving, boredom Type of tobacco use: cigarettes  Children in the house: yes Other household members who smoke: no Treatments attempted: patches, gum, chantix Pneumovax: Given today  Menopausal Symptoms: no  Depression Screen done today and results listed below:  Depression screen [facility] 2/9 07/31/2019  Decreased Interest 0  Down, Depressed, Hopeless 0  PHQ - 2 Score 0    Past Medical History:  Past Medical History:  Diagnosis Date  . Anemia   . Anxiety   . Arthritis   . Depression   . History of kidney stones   . IFG (impaired fasting glucose)   . Obesity   . PONV (postoperative nausea and vomiting)   . Tobacco abuse     Surgical History:  Past Surgical History:  Procedure Laterality Date  . BARIATRIC SURGERY    . CESAREAN SECTION     x 2   . CYSTOSCOPY N/A 06/18/2017   Procedure: CYSTOSCOPY;  Surgeon: [name], MD;  Location: [facility] ORS;  Service: Gynecology;  Laterality: N/A;  . INTRAUTERINE DEVICE (IUD) INSERTION     feb 2014  . KNEE ARTHROSCOPY Right   . LAPAROSCOPIC HYSTERECTOMY N/A 06/18/2017   Procedure: HYSTERECTOMY TOTAL LAPAROSCOPIC;  Surgeon: [name], MD;  Location: [facility] ORS;  Service: Gynecology;  Laterality: N/A;  . TUBAL LIGATION    . UTERINE FIBROID EMBOLIZATION  01/09/2017   Triangular vascular    Medications:  Current Outpatient Medications on File Prior to  Visit  Medication Sig  . Ascorbic Acid (VITAMIN C) 1000 MG tablet Take 1,000 mg by mouth daily. Chewable  . b complex vitamins tablet Take 1 tablet by mouth daily.  . Calcium-Magnesium-Zinc 333-133-5 MG TABS Take by mouth.  . Cholecalciferol (VITAMIN D3) 5000 units CAPS Take 5,000 Units by mouth daily.  . ferrous sulfate 325 (65 FE) MG tablet Take 65 mg by mouth daily with breakfast.  . Multiple Vitamin (MULTIVITAMIN) tablet Take 1 tablet by mouth daily.  . vitamin A 8000 UNIT capsule Take 8,000 Units by mouth daily.  . vitamin E 200 UNIT capsule Take 200 Units by mouth daily.  Marland Kitchen VITAMIN K PO Take 100 mcg by mouth daily.   No current facility-administered medications on file prior to visit.     Allergies:  Allergies  Allergen Reactions  . Honey Bee Treatment [Bee Venom] Itching and Swelling  . Oxycodone-Acetaminophen Hives and Itching    Social History:  Social History   Socioeconomic History  . Marital status: Divorced    Spouse name: Not on file  . Number of children: Not on file  . Years of education: Not on file  . Highest education level: Not on file  Occupational History  . Not on file  Social Needs  . Financial resource strain: Not on file  . Food insecurity    Worry: Not on file  Inability: Not on file  . Transportation needs    Medical: Not on file    Non-medical: Not on file  Tobacco Use  . Smoking status: Current Every Day Smoker    Packs/day: 0.50    Types: Cigarettes  . Smokeless tobacco: Never Used  Substance and Sexual Activity  . Alcohol use: No  . Drug use: No  . Sexual activity: Not Currently    Birth control/protection: None  Lifestyle  . Physical activity    Days per week: Not on file    Minutes per session: Not on file  . Stress: Not on file  Relationships  . Social Herbalist on phone: Not on file    Gets together: Not on file    Attends religious service: Not on file    Active member of club or organization: Not on file     Attends meetings of clubs or organizations: Not on file    Relationship status: Not on file  . Intimate partner violence    Fear of current or ex partner: Not on file    Emotionally abused: Not on file    Physically abused: Not on file    Forced sexual activity: Not on file  Other Topics Concern  . Not on file  Social History Narrative  . Not on file   Social History   Tobacco Use  Smoking Status Current Every Day Smoker  . Packs/day: 0.50  . Types: Cigarettes  Smokeless Tobacco Never Used   Social History   Substance and Sexual Activity  Alcohol Use No    Family History:  Family History  Problem Relation Age of Onset  . Hypertension Mother   . Hyperlipidemia Mother   . Depression Mother   . Fibroids Mother   . Breast cancer Neg Hx     Past medical history, surgical history, medications, allergies, family history and social history reviewed with patient today and changes made to appropriate areas of the chart.   Review of Systems  Constitutional: Negative.   HENT: Negative.   Eyes: Negative.   Respiratory: Negative.   Cardiovascular: Negative.   Gastrointestinal: Negative.   Genitourinary: Negative.   Musculoskeletal: Negative.   Skin: Negative.   Neurological: Negative.   Endo/Heme/Allergies: Negative for environmental allergies and polydipsia. Bruises/bleeds easily.  Psychiatric/Behavioral: Negative.     All other ROS negative except what is listed above and in the HPI.      Objective:    BP 127/72   Pulse 72   Temp 98.5 F (36.9 C)   Ht 5' 6.02" (1.677 m)   Wt 161 lb 6 oz (73.2 kg)   LMP 05/27/2017 Comment: preg test neg  SpO2 100%   BMI 26.03 kg/m   Wt Readings from Last 3 Encounters:  07/31/19 161 lb 6 oz (73.2 kg)  01/08/19 155 lb (70.3 kg)  04/01/18 155 lb 6.4 oz (70.5 kg)    Physical Exam Vitals signs and nursing note reviewed.  Constitutional:      General: She is not in acute distress.    Appearance: Normal appearance. She is  not ill-appearing, toxic-appearing or diaphoretic.  HENT:     Head: Normocephalic and atraumatic.     Right Ear: Tympanic membrane, ear canal and external ear normal. There is no impacted cerumen.     Left Ear: Tympanic membrane, ear canal and external ear normal. There is no impacted cerumen.     Nose: Nose normal. No congestion or rhinorrhea.  Mouth/Throat:     Mouth: Mucous membranes are moist.     Pharynx: Oropharynx is clear. No oropharyngeal exudate or posterior oropharyngeal erythema.  Eyes:     General: No scleral icterus.       Right eye: No discharge.        Left eye: No discharge.     Extraocular Movements: Extraocular movements intact.     Conjunctiva/sclera: Conjunctivae normal.     Pupils: Pupils are equal, round, and reactive to light.  Neck:     Musculoskeletal: Normal range of motion and neck supple. No neck rigidity or muscular tenderness.     Vascular: No carotid bruit.  Cardiovascular:     Rate and Rhythm: Normal rate and regular rhythm.     Pulses: Normal pulses.     Heart sounds: No murmur. No friction rub. No gallop.   Pulmonary:     Effort: Pulmonary effort is normal. No respiratory distress.     Breath sounds: Normal breath sounds. No stridor. No wheezing, rhonchi or rales.  Chest:     Chest wall: No tenderness.  Abdominal:     General: Abdomen is flat. Bowel sounds are normal. There is no distension.     Palpations: Abdomen is soft. There is no mass.     Tenderness: There is no abdominal tenderness. There is no right CVA tenderness, left CVA tenderness, guarding or rebound.     Hernia: No hernia is present.  Genitourinary:    Comments: Breast and pelvic exams deferred with shared decision making Musculoskeletal:        General: No swelling, tenderness, deformity or signs of injury.     Right lower leg: No edema.     Left lower leg: No edema.  Lymphadenopathy:     Cervical: No cervical adenopathy.  Skin:    General: Skin is warm and dry.      Capillary Refill: Capillary refill takes less than 2 seconds.     Coloration: Skin is not jaundiced or pale.     Findings: No bruising, erythema, lesion or rash.  Neurological:     General: No focal deficit present.     Mental Status: She is alert and oriented to person, place, and time. Mental status is at baseline.     Cranial Nerves: No cranial nerve deficit.     Sensory: No sensory deficit.     Motor: No weakness.     Coordination: Coordination normal.     Gait: Gait normal.     Deep Tendon Reflexes: Reflexes normal.  Psychiatric:        Mood and Affect: Mood normal.        Behavior: Behavior normal.        Thought Content: Thought content normal.        Judgment: Judgment normal.     Results for orders placed or performed in visit on 06/17/19  CBC with Differential/Platelet  Result Value Ref Range   WBC 6.0 3.4 - 10.8 x10E3/uL   RBC 4.13 3.77 - 5.28 x10E6/uL   Hemoglobin 13.7 11.1 - 15.9 g/dL   Hematocrit 41.6 34.0 - 46.6 %   MCV 101 (H) 79 - 97 fL   MCH 33.2 (H) 26.6 - 33.0 pg   MCHC 32.9 31.5 - 35.7 g/dL   RDW 12.4 11.7 - 15.4 %   Platelets 218 150 - 450 x10E3/uL   Neutrophils 56 Not Estab. %   Lymphs 33 Not Estab. %   Monocytes 6 Not Estab. %  Eos 4 Not Estab. %   Basos 1 Not Estab. %   Neutrophils Absolute 3.3 1.4 - 7.0 x10E3/uL   Lymphocytes Absolute 2.0 0.7 - 3.1 x10E3/uL   Monocytes Absolute 0.4 0.1 - 0.9 x10E3/uL   EOS (ABSOLUTE) 0.3 0.0 - 0.4 x10E3/uL   Basophils Absolute 0.1 0.0 - 0.2 x10E3/uL   Immature Granulocytes 0 Not Estab. %   Immature Grans (Abs) 0.0 0.0 - 0.1 x10E3/uL  Rocky mtn spotted fvr abs pnl(IgG+IgM)  Result Value Ref Range   RMSF IgG Negative Negative   RMSF IgM 0.92 (H) 0.00 - 2.83 index  Ehrlichia Antibody Panel  Result Value Ref Range   E.Chaffeensis (HME) IgG Negative Neg:<1:64   E. Chaffeensis (HME) IgM Titer Negative Neg:<1:20   HGE IgG Titer Negative Neg:<1:64   HGE IgM Titer Negative Neg:<1:20  Babesia microti Antibody  Panel  Result Value Ref Range   Babesia microti IgM <1:10 Neg:<1:10   Babesia microti IgG <1:10 Neg:<1:10  Lyme Ab/Western Blot Reflex  Result Value Ref Range   Lyme IgG/IgM Ab <0.91 0.00 - 0.90 ISR   LYME DISEASE AB, QUANT, IGM <0.80 0.00 - 0.79 index  RA Qn+CCP(IgG/A)+SjoSSA+SjoSSB  Result Value Ref Range   Rhuematoid fact SerPl-aCnc <10.0 0.0 - 13.9 IU/mL   ENA SSA (RO) Ab <0.2 0.0 - 0.9 AI   ENA SSB (LA) Ab <0.2 0.0 - 0.9 AI   Cyclic Citrullin Peptide Ab 3 0 - 19 units  Sed Rate (ESR)  Result Value Ref Range   Sed Rate 12 0 - 32 mm/hr  Antinuclear Antib (ANA)  Result Value Ref Range   Anti Nuclear Antibody (ANA) Negative Negative  C-reactive protein  Result Value Ref Range   CRP <1 0 - 10 mg/L  TSH  Result Value Ref Range   TSH 1.270 0.450 - 4.500 uIU/mL  VITAMIN D 25 Hydroxy (Vit-D Deficiency, Fractures)  Result Value Ref Range   Vit D, 25-Hydroxy 27.7 (L) 30.0 - 100.0 ng/mL      Assessment & Plan:   Problem List Items Addressed This Visit      Other   Tobacco abuse    Would like to try wellbutrin. Rx sent to her pharmacy. Recheck 1 month. Call with any concerns.        Other Visit Diagnoses    Routine general medical examination at a health care facility    -  Primary   Vaccines updated. Screening labs checked today. Mammogram up to date. Pap N/A. Continue diet and exercise. Call with any concerns.    Relevant Orders   Lipid Panel w/o Chol/HDL Ratio   HIV Antibody (routine testing w rflx)   Flu vaccine need       Flu shot given today.   Relevant Orders   Flu Vaccine QUAD 36+ mos IM (Completed)       Follow up plan: Return in about 4 weeks (around 08/28/2019) for follow up smoking.   LABORATORY TESTING:  - Pap smear: not applicable  IMMUNIZATIONS:   - Tdap: Tetanus vaccination status reviewed: last tetanus booster within 10 years. - Influenza: Administered today - Pneumovax: Given today  SCREENING: -Mammogram: Up to date   PATIENT COUNSELING:    Advised to take 1 mg of folate supplement per day if capable of pregnancy.   Sexuality: Discussed sexually transmitted diseases, partner selection, use of condoms, avoidance of unintended pregnancy  and contraceptive alternatives.   Advised to avoid cigarette smoking.  I discussed with the patient that most  people either abstain from alcohol or drink within safe limits (<=14/week and <=4 drinks/occasion for males, <=7/weeks and <= 3 drinks/occasion for females) and that the risk for alcohol disorders and other health effects rises proportionally with the number of drinks per week and how often a drinker exceeds daily limits.  Discussed cessation/primary prevention of drug use and availability of treatment for abuse.   Diet: Encouraged to adjust caloric intake to maintain  or achieve ideal body weight, to reduce intake of dietary saturated fat and total fat, to limit sodium intake by avoiding high sodium foods and not adding table salt, and to maintain adequate dietary potassium and calcium preferably from fresh fruits, vegetables, and low-fat dairy products.    stressed the importance of regular exercise  Injury prevention: Discussed safety belts, safety helmets, smoke detector, smoking near bedding or upholstery.   Dental health: Discussed importance of regular tooth brushing, flossing, and dental visits.    NEXT PREVENTATIVE PHYSICAL DUE IN 1 YEAR. Return in about 4 weeks (around 08/28/2019) for follow up smoking.

## 2019-07-31 NOTE — Assessment & Plan Note (Signed)
Would like to try wellbutrin. Rx sent to her pharmacy. Recheck 1 month. Call with any concerns.

## 2019-07-31 NOTE — Patient Instructions (Signed)
Health Maintenance, Female Adopting a healthy lifestyle and getting preventive care are important in promoting health and wellness. Ask your health care provider about:  The right schedule for you to have regular tests and exams.  Things you can do on your own to prevent diseases and keep yourself healthy. What should I know about diet, weight, and exercise? Eat a healthy diet   Eat a diet that includes plenty of vegetables, fruits, low-fat dairy products, and lean protein.  Do not eat a lot of foods that are high in solid fats, added sugars, or sodium. Maintain a healthy weight Body mass index (BMI) is used to identify weight problems. It estimates body fat based on height and weight. Your health care provider can help determine your BMI and help you achieve or maintain a healthy weight. Get regular exercise Get regular exercise. This is one of the most important things you can do for your health. Most adults should:  Exercise for at least 150 minutes each week. The exercise should increase your heart rate and make you sweat (moderate-intensity exercise).  Do strengthening exercises at least twice a week. This is in addition to the moderate-intensity exercise.  Spend less time sitting. Even light physical activity can be beneficial. Watch cholesterol and blood lipids Have your blood tested for lipids and cholesterol at 42 years of age, then have this test every 5 years. Have your cholesterol levels checked more often if:  Your lipid or cholesterol levels are high.  You are older than 42 years of age.  You are at high risk for heart disease. What should I know about cancer screening? Depending on your health history and family history, you may need to have cancer screening at various ages. This may include screening for:  Breast cancer.  Cervical cancer.  Colorectal cancer.  Skin cancer.  Lung cancer. What should I know about heart disease, diabetes, and high blood  pressure? Blood pressure and heart disease  High blood pressure causes heart disease and increases the risk of stroke. This is more likely to develop in people who have high blood pressure readings, are of African descent, or are overweight.  Have your blood pressure checked: ? Every 3-5 years if you are 68-56 years of age. ? Every year if you are 44 years old or older. Diabetes Have regular diabetes screenings. This checks your fasting blood sugar level. Have the screening done:  Once every three years after age 35 if you are at a normal weight and have a low risk for diabetes.  More often and at a younger age if you are overweight or have a high risk for diabetes. What should I know about preventing infection? Hepatitis B If you have a higher risk for hepatitis B, you should be screened for this virus. Talk with your health care provider to find out if you are at risk for hepatitis B infection. Hepatitis C Testing is recommended for:  Everyone born from [birth year] through 1964.  Anyone with known risk factors for hepatitis C. Sexually transmitted infections (STIs)  Get screened for STIs, including gonorrhea and chlamydia, if: ? You are sexually active and are younger than 42 years of age. ? You are older than 42 years of age and your health care provider tells you that you are at risk for this type of infection. ? Your sexual activity has changed since you were last screened, and you are at increased risk for chlamydia or gonorrhea. Ask your health care provider if  you are at risk.  Ask your health care provider about whether you are at high risk for HIV. Your health care provider may recommend a prescription medicine to help prevent HIV infection. If you choose to take medicine to prevent HIV, you should first get tested for HIV. You should then be tested every 3 months for as long as you are taking the medicine. Pregnancy  If you are about to stop having your period (premenopausal) and  you may become pregnant, seek counseling before you get pregnant.  Take 400 to 800 micrograms (mcg) of folic acid every day if you become pregnant.  Ask for birth control (contraception) if you want to prevent pregnancy. Osteoporosis and menopause Osteoporosis is a disease in which the bones lose minerals and strength with aging. This can result in bone fractures. If you are 77 years old or older, or if you are at risk for osteoporosis and fractures, ask your health care provider if you should:  Be screened for bone loss.  Take a calcium or vitamin D supplement to lower your risk of fractures.  Be given hormone replacement therapy (HRT) to treat symptoms of menopause. Follow these instructions at home: Lifestyle  Do not use any products that contain nicotine or tobacco, such as cigarettes, e-cigarettes, and chewing tobacco. If you need help quitting, ask your health care provider.  Do not use street drugs.  Do not share needles.  Ask your health care provider for help if you need support or information about quitting drugs. Alcohol use  Do not drink alcohol if: ? Your health care provider tells you not to drink. ? You are pregnant, may be pregnant, or are planning to become pregnant.  If you drink alcohol: ? Limit how much you use to 0-1 drink a day. ? Limit intake if you are breastfeeding.  Be aware of how much alcohol is in your drink. In the [place] one drink equals one 12 oz bottle of beer (355 mL), one 5 oz glass of wine (148 mL), or one 1 oz glass of hard liquor (44 mL). General instructions  Schedule regular health, dental, and eye exams.  Stay current with your vaccines.  Tell your health care provider if: ? You often feel depressed. ? You have ever been abused or do not feel safe at home. Summary  Adopting a healthy lifestyle and getting preventive care are important in promoting health and wellness.  Follow your health care provider's instructions about healthy  diet, exercising, and getting tested or screened for diseases.  Follow your health care provider's instructions on monitoring your cholesterol and blood pressure. This information is not intended to replace advice given to you by your health care provider. Make sure you discuss any questions you have with your health care provider. Document Released: 04/30/2011 Document Revised: 10/08/2018 Document Reviewed: 10/08/2018 Elsevier Patient Education  [place]. Influenza (Flu) Vaccine (Inactivated or Recombinant): What You Need to Know 1. Why get vaccinated? Influenza vaccine can prevent influenza (flu). Flu is a contagious disease that spreads around the [place] every year, usually between October and May. Anyone can get the flu, but it is more dangerous for some people. Infants and young children, people 45 years of age and older, pregnant women, and people with certain health conditions or a weakened immune system are at greatest risk of flu complications. Pneumonia, bronchitis, sinus infections and ear infections are examples of flu-related complications. If you have a medical condition, such as heart disease, cancer  or diabetes, flu can make it worse. Flu can cause fever and chills, sore throat, muscle aches, fatigue, cough, headache, and runny or stuffy nose. Some people may have vomiting and diarrhea, though this is more common in children than adults. Each year thousands of people in the [place] die from flu, and many more are hospitalized. Flu vaccine prevents millions of illnesses and flu-related visits to the doctor each year. 2. Influenza vaccine CDC recommends everyone 39 months of age and older get vaccinated every flu season. Children 6 months through 4 years of age may need 2 doses during a single flu season. Everyone else needs only 1 dose each flu season. It takes about 2 weeks for protection to develop after vaccination. There are many flu viruses, and they are  always changing. Each year a new flu vaccine is made to protect against three or four viruses that are likely to cause disease in the upcoming flu season. Even when the vaccine doesn't exactly match these viruses, it may still provide some protection. Influenza vaccine does not cause flu. Influenza vaccine may be given at the same time as other vaccines. 3. Talk with your health care provider Tell your vaccine provider if the person getting the vaccine:  Has had an allergic reaction after a previous dose of influenza vaccine, or has any severe, life-threatening allergies.  Has ever had Guillain-Barr Syndrome (also called GBS). In some cases, your health care provider may decide to postpone influenza vaccination to a future visit. People with minor illnesses, such as a cold, may be vaccinated. People who are moderately or severely ill should usually wait until they recover before getting influenza vaccine. Your health care provider can give you more information. 4. Risks of a vaccine reaction  Soreness, redness, and swelling where shot is given, fever, muscle aches, and headache can happen after influenza vaccine.  There may be a very small increased risk of Guillain-Barr Syndrome (GBS) after inactivated influenza vaccine (the flu shot). Young children who get the flu shot along with pneumococcal vaccine (PCV13), and/or DTaP vaccine at the same time might be slightly more likely to have a seizure caused by fever. Tell your health care provider if a child who is getting flu vaccine has ever had a seizure. People sometimes faint after medical procedures, including vaccination. Tell your provider if you feel dizzy or have vision changes or ringing in the ears. As with any medicine, there is a very remote chance of a vaccine causing a severe allergic reaction, other serious injury, or death. 5. What if there is a serious problem? An allergic reaction could occur after the vaccinated person leaves  the clinic. If you see signs of a severe allergic reaction (hives, swelling of the face and throat, difficulty breathing, a fast heartbeat, dizziness, or weakness), call 9-1-1 and get the person to the nearest hospital. For other signs that concern you, call your health care provider. Adverse reactions should be reported to the Vaccine Adverse Event Reporting System (VAERS). Your health care provider will usually file this report, or you can do it yourself. Visit the VAERS website at www.vaers.SamedayNews.es or call 778 250 1205.VAERS is only for reporting reactions, and VAERS staff do not give medical advice. 6. The National Vaccine Injury Compensation Program The [employer] Vaccine Injury Compensation Program (VICP) is a federal program that was created to compensate people who may have been injured by certain vaccines. Visit the VICP website at GoldCloset.com.ee or call (314)838-0440 to learn about the program and about  filing a claim. There is a time limit to file a claim for compensation. 7. How can I learn more?  Ask your healthcare provider.  Call your local or state health department.  Contact the Centers for Disease Control and Prevention (CDC): ? Call 808-413-3288 (1-800-CDC-INFO) or ? Visit CDC's https://gibson.com/ Vaccine Information Statement (Interim) Inactivated Influenza Vaccine (06/12/2018) This information is not intended to replace advice given to you by your health care provider. Make sure you discuss any questions you have with your health care provider. Document Released: 08/09/2006 Document Revised: 02/03/2019 Document Reviewed: 06/16/2018 Elsevier Patient Education  [place]. Pneumococcal Polysaccharide Vaccine (PPSV23): What You Need to Know 1. Why get vaccinated? Pneumococcal polysaccharide vaccine (PPSV23) can prevent pneumococcal disease. Pneumococcal disease refers to any illness caused by pneumococcal bacteria. These bacteria can cause many types of  illnesses, including pneumonia, which is an infection of the lungs. Pneumococcal bacteria are one of the most common causes of pneumonia. Besides pneumonia, pneumococcal bacteria can also cause:  Ear infections  Sinus infections  Meningitis (infection of the tissue covering the brain and spinal cord)  Bacteremia (bloodstream infection) Anyone can get pneumococcal disease, but children under 16 years of age, people with certain medical conditions, adults 52 years or older, and cigarette smokers are at the highest risk. Most pneumococcal infections are mild. However, some can result in long-term problems, such as brain damage or hearing loss. Meningitis, bacteremia, and pneumonia caused by pneumococcal disease can be fatal. 2. PPSV23 PPSV23 protects against 23 types of bacteria that cause pneumococcal disease. PPSV23 is recommended for:  All adults 63 years or older,  Anyone 2 years or older with certain medical conditions that can lead to an increased risk for pneumococcal disease. Most people need only one dose of PPSV23. A second dose of PPSV23, and another type of pneumococcal vaccine called PCV13, are recommended for certain high-risk groups. Your health care provider can give you more information. People 65 years or older should get a dose of PPSV23 even if they have already gotten one or more doses of the vaccine before they turned 28. 3. Talk with your health care provider Tell your vaccine provider if the person getting the vaccine:  Has had an allergic reaction after a previous dose of PPSV23, or has any severe, life-threatening allergies. In some cases, your health care provider may decide to postpone PPSV23 vaccination to a future visit. People with minor illnesses, such as a cold, may be vaccinated. People who are moderately or severely ill should usually wait until they recover before getting PPSV23. Your health care provider can give you more information. 4. Risks of a vaccine  reaction  Redness or pain where the shot is given, feeling tired, fever, or muscle aches can happen after PPSV23. People sometimes faint after medical procedures, including vaccination. Tell your provider if you feel dizzy or have vision changes or ringing in the ears. As with any medicine, there is a very remote chance of a vaccine causing a severe allergic reaction, other serious injury, or death. 5. What if there is a serious problem? An allergic reaction could occur after the vaccinated person leaves the clinic. If you see signs of a severe allergic reaction (hives, swelling of the face and throat, difficulty breathing, a fast heartbeat, dizziness, or weakness), call 9-1-1 and get the person to the nearest hospital. For other signs that concern you, call your health care provider. Adverse reactions should be reported to the Vaccine Adverse Event Reporting System (  VAERS). Your health care provider will usually file this report, or you can do it yourself. Visit the VAERS website at www.vaers.SamedayNews.es or call (337)398-7244. VAERS is only for reporting reactions, and VAERS staff do not give medical advice. 6. How can I learn more?  Ask your health care provider.  Call your local or state health department.  Contact the Centers for Disease Control and Prevention (CDC): ? Call 743-875-5850 (1-800-CDC-INFO) or ? Visit CDC's website at http://hunter.com/ CDC Vaccine Information Statement PPSV23 Vaccine (08/27/2018) This information is not intended to replace advice given to you by your health care provider. Make sure you discuss any questions you have with your health care provider. Document Released: 08/12/2006 Document Revised: 02/03/2019 Document Reviewed: 05/27/2018 Elsevier Patient Education  2020 [employer].

## 2019-09-16 ENCOUNTER — Telehealth: Payer: Self-pay

## 2019-09-16 NOTE — Telephone Encounter (Signed)
Paper in Dr.Johnson's folder for signature

## 2019-09-16 NOTE — Telephone Encounter (Signed)
Patient will email a copy of this to be filled out, unable to locate in office. Copied from [place] [phone]. Topic: General - Other >> Sep 15, 2019  3:51 PM Rainey Pines A wrote: Patient requesting a callback in regards to staff health questionnaire form she dropped off at from desk during her appointment 10/02. Patient stated that her employer never received the filled out form back. Please advise.

## 2019-09-21 ENCOUNTER — Other Ambulatory Visit: Payer: Self-pay

## 2019-09-21 ENCOUNTER — Other Ambulatory Visit: Payer: 59

## 2019-09-21 DIAGNOSIS — Z789 Other specified health status: Secondary | ICD-10-CM

## 2019-09-21 DIAGNOSIS — Z111 Encounter for screening for respiratory tuberculosis: Secondary | ICD-10-CM

## 2019-09-21 DIAGNOSIS — Z0000 Encounter for general adult medical examination without abnormal findings: Secondary | ICD-10-CM

## 2019-09-24 LAB — MEASLES/MUMPS/RUBELLA IMMUNITY
MUMPS ABS, IGG: >300 AU/mL (ref >10.9)
RUBEOLA AB, IGG: 52.8 AU/mL (ref >16.4)
Rubella Antibodies, IGG: 2.12 index (ref >0.99)

## 2019-09-24 LAB — QUANTIFERON-TB GOLD PLUS
QuantiFERON Mitogen Value: >10 IU/mL
QuantiFERON Nil Value: 0.03 IU/mL
QuantiFERON TB1 Ag Value: 0.02 IU/mL
QuantiFERON TB2 Ag Value: 0.02 IU/mL
QuantiFERON-TB Gold Plus: NEGATIVE

## 2019-09-24 LAB — LIPID PANEL W/O CHOL/HDL RATIO
Cholesterol, Total: 129 mg/dL (ref 100–199)
HDL: 59 mg/dL (ref >39)
LDL Chol Calc (NIH): 58 mg/dL (ref 0–99)
Triglycerides: 52 mg/dL (ref 0–149)
VLDL Cholesterol Cal: 12 mg/dL (ref 5–40)

## 2019-09-24 LAB — HIV ANTIBODY (ROUTINE TESTING W REFLEX): HIV Screen 4th Generation wRfx: NONREACTIVE

## 2019-11-22 ENCOUNTER — Encounter: Payer: Self-pay | Admitting: Intensive Care

## 2019-11-22 ENCOUNTER — Emergency Department
Admission: EM | Admit: 2019-11-22 | Discharge: 2019-11-22 | Disposition: A | Discharge status: Home / Self Care | Payer: BC Managed Care – PPO | Attending: Student | Admitting: Student

## 2019-11-22 ENCOUNTER — Other Ambulatory Visit: Payer: Self-pay

## 2019-11-22 DIAGNOSIS — Y999 Unspecified external cause status: Secondary | ICD-10-CM | POA: Diagnosis not present

## 2019-11-22 DIAGNOSIS — Y939 Activity, unspecified: Secondary | ICD-10-CM | POA: Insufficient documentation

## 2019-11-22 DIAGNOSIS — Z87891 Personal history of nicotine dependence: Secondary | ICD-10-CM | POA: Insufficient documentation

## 2019-11-22 DIAGNOSIS — W57XXXA Bitten or stung by nonvenomous insect and other nonvenomous arthropods, initial encounter: Secondary | ICD-10-CM | POA: Insufficient documentation

## 2019-11-22 DIAGNOSIS — Z79899 Other long term (current) drug therapy: Secondary | ICD-10-CM | POA: Insufficient documentation

## 2019-11-22 DIAGNOSIS — J45909 Unspecified asthma, uncomplicated: Secondary | ICD-10-CM | POA: Insufficient documentation

## 2019-11-22 DIAGNOSIS — S50862A Insect bite (nonvenomous) of left forearm, initial encounter: Secondary | ICD-10-CM | POA: Diagnosis not present

## 2019-11-22 DIAGNOSIS — Y929 Unspecified place or not applicable: Secondary | ICD-10-CM | POA: Insufficient documentation

## 2019-11-22 MED ORDER — CEPHALEXIN 500 MG PO CAPS: 500.0000 mg | ORAL_CAPSULE | Freq: Two times a day (BID) | ORAL | 0 refills | Status: AC

## 2019-11-22 MED ORDER — CEPHALEXIN 500 MG PO CAPS
500.0000 mg | ORAL_CAPSULE | Freq: Once | ORAL | Status: AC
  Administered 2019-11-22: 500 mg via ORAL
  Filled 2019-11-22: qty 1

## 2019-11-22 MED ORDER — METHYLPREDNISOLONE SODIUM SUCC 125 MG IJ SOLR
80.0000 mg | Freq: Once | INTRAMUSCULAR | Status: AC
  Administered 2019-11-22: 80 mg via INTRAMUSCULAR
  Filled 2019-11-22: qty 2

## 2019-11-22 MED ORDER — DIPHENHYDRAMINE HCL 25 MG PO CAPS
25.0000 mg | ORAL_CAPSULE | Freq: Once | ORAL | Status: AC
  Administered 2019-11-22: 25 mg via ORAL
  Filled 2019-11-22: qty 1

## 2019-11-22 MED ORDER — FAMOTIDINE 20 MG PO TABS
20.0000 mg | ORAL_TABLET | Freq: Once | ORAL | Status: AC
  Administered 2019-11-22: 20 mg via ORAL
  Filled 2019-11-22: qty 1

## 2019-11-22 MED ORDER — DIPHENHYDRAMINE HCL 25 MG PO CAPS: 25.0000 mg | ORAL_CAPSULE | ORAL | 0 refills | Status: DC | PRN

## 2019-11-22 NOTE — ED Provider Notes (Signed)
[facility] Emergency Department Provider Note  ____________________________________________  Time seen: Approximately 6:05 PM  I have reviewed the triage vital signs and the nursing notes.   HISTORY  Chief Complaint Arm Pain (left)    HPI Haley Garza is a 44 y.o. female that presents to the emergency department for evaluation of insect bite to left forearm for 1 day.  Patient felt a sting to her left forearm yesterday but did not see what stung her.  She did not see any ticks.  This morning area surrounding the sting was red. Area itches.  She applied some cream to area last night.  No fevers.   Past Medical History:  Diagnosis Date  . Anemia   . Anxiety   . Arthritis   . Depression   . History of kidney stones   . IFG (impaired fasting glucose)   . Obesity   . PONV (postoperative nausea and vomiting)   . Tobacco abuse     Patient Active Problem List   Diagnosis Date Noted  . S/P laparoscopic hysterectomy 06/18/2017  . 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 Bariatric surgery status 09/14/2015  . Asthma 09/14/2015  . Anxiety and depression 09/14/2015  . Tobacco abuse 09/14/2015  . IFG (impaired fasting glucose) 09/14/2015    Past Surgical History:  Procedure Laterality Date  . BARIATRIC SURGERY    . CESAREAN SECTION     x 2   . CYSTOSCOPY N/A 06/18/2017   Procedure: CYSTOSCOPY;  Surgeon: [name], MD;  Location: [facility] ORS;  Service: Gynecology;  Laterality: N/A;  . INTRAUTERINE DEVICE (IUD) INSERTION     feb 2014  . KNEE ARTHROSCOPY Right   . LAPAROSCOPIC HYSTERECTOMY N/A 06/18/2017   Procedure: HYSTERECTOMY TOTAL LAPAROSCOPIC;  Surgeon: [name], MD;  Location: [facility] ORS;  Service: Gynecology;  Laterality: N/A;  . TUBAL LIGATION    . UTERINE FIBROID EMBOLIZATION  01/09/2017   Triangular vascular    Prior to Admission medications   Medication Sig Start Date End Date Taking? Authorizing Provider  Ascorbic Acid (VITAMIN C)  1000 MG tablet Take 1,000 mg by mouth daily. Chewable    [provider]  b complex vitamins tablet Take 1 tablet by mouth daily.    [provider]  buPROPion (WELLBUTRIN SR) 150 MG 12 hr tablet 1 pill a day for 3 days, then increase to 1 pill 2x a day, pick a day in the 2nd week to quit 07/31/19   Valerie Roys, DO  Calcium-Magnesium-Zinc [id number] MG TABS Take by mouth.    [provider]  cephALEXin (KEFLEX) 500 MG capsule Take 1 capsule (500 mg total) by mouth 2 (two) times daily for 10 days. 11/22/19 12/02/19  Laban Emperor, PA-C  Cholecalciferol (VITAMIN D3) 5000 units CAPS Take 5,000 Units by mouth daily.    [provider]  diphenhydrAMINE (BENADRYL) 25 mg capsule Take 1 capsule (25 mg total) by mouth every 4 (four) hours as needed. 11/22/19 11/21/20  Laban Emperor, PA-C  ferrous sulfate 325 (65 FE) MG tablet Take 65 mg by mouth daily with breakfast.    [provider]  Multiple Vitamin (MULTIVITAMIN) tablet Take 1 tablet by mouth daily.    [provider]  vitamin A 8000 UNIT capsule Take 8,000 Units by mouth daily.    [provider]  vitamin E 200 UNIT capsule Take 200 Units by mouth daily.    [provider]  VITAMIN K PO Take 100 mcg by mouth daily.  [provider]    Allergies Honey bee treatment [bee venom] and Oxycodone-acetaminophen  Family History  Problem Relation Age of Onset  . Hypertension Mother   . Hyperlipidemia Mother   . Depression Mother   . Fibroids Mother   . Breast cancer Neg Hx     Social History Social History   Tobacco Use  . Smoking status: Former Smoker    Packs/day: 0.50    Types: Cigarettes  . Smokeless tobacco: Never Used  Substance Use Topics  . Alcohol use: Yes    Comment: occ  . Drug use: No     Review of Systems  Constitutional: No fever/chills Cardiac: No chest pain Respiratory: No shortness of breath Gastrointestinal: No nausea, no vomiting.   Musculoskeletal: Negative for musculoskeletal pain. Skin: Negative for abrasions, lacerations, ecchymosis.  Positive for rash. Neurological: Negative for numbness or tingling   ____________________________________________   PHYSICAL EXAM:  VITAL SIGNS: ED Triage Vitals  Enc Vitals Group     BP 11/22/19 1723 115/71     Pulse Rate 11/22/19 1723 67     Resp 11/22/19 1723 18     Temp 11/22/19 1723 98.2 F (36.8 C)     Temp Source 11/22/19 1723 Oral     SpO2 11/22/19 1723 100 %     Weight 11/22/19 1723 163 lb (73.9 kg)     Height 11/22/19 1723 5\' 7"  (1.702 m)     Head Circumference --      Peak Flow --      Pain Score 11/22/19 1733 4     Pain Loc --      Pain Edu? --      Excl. in [place]? --      Constitutional: Alert and oriented. Well appearing and in no acute distress. Eyes: Conjunctivae are normal. PERRL. EOMI. Head: Atraumatic. ENT:      Ears:      Nose: No congestion/rhinnorhea.      Mouth/Throat: Mucous membranes are moist.  Neck: No stridor. Cardiovascular:  Good peripheral circulation. Respiratory: Normal respiratory effort without tachypnea or retractions.  Musculoskeletal: Full range of motion to all extremities. No gross deformities appreciated. Neurologic:  Normal speech and language. No gross focal neurologic deficits are appreciated.  Skin:  Skin is warm, dry and intact.  1 inch circular area of erythematous skin to left volar forearm.  1 mm central induration consistent with insect sting. Psychiatric: Mood and affect are normal. Speech and behavior are normal. Patient exhibits appropriate insight and judgement.   ____________________________________________   LABS (all labs ordered are listed, but only abnormal results are displayed)  Labs Reviewed - No data to display ____________________________________________  EKG   ____________________________________________  RADIOLOGY   No results  found.  ____________________________________________    PROCEDURES  Procedure(s) performed:    Procedures    Medications  cephALEXin (KEFLEX) capsule 500 mg (500 mg Oral Given 11/22/19 1848)  methylPREDNISolone sodium succinate (SOLU-MEDROL) 125 mg/2 mL injection 80 mg (80 mg Intramuscular Given 11/22/19 1848)  diphenhydrAMINE (BENADRYL) capsule 25 mg (25 mg Oral Given 11/22/19 1848)  famotidine (PEPCID) tablet 20 mg (20 mg Oral Given 11/22/19 1848)     ____________________________________________   INITIAL IMPRESSION / ASSESSMENT AND PLAN / ED COURSE  Pertinent labs & imaging results that were available during my care of the patient were reviewed by me and considered in my medical decision making (see chart for details).  Review of the [place] CSRS was performed in accordance of the [place] prior  to dispensing any controlled drugs.   Patient's diagnosis is consistent with insect bite. Patient will be discharged home with prescriptions for keflex and benadryl. Patient is to follow up with PCP as directed. Patient is given ED precautions to return to the ED for any worsening or new symptoms.  Haley Garza was evaluated in Emergency Department on 11/22/2019 for the symptoms described in the history of present illness. She was evaluated in the context of the global COVID-19 pandemic, which necessitated consideration that the patient might be at risk for infection with the SARS-CoV-2 virus that causes COVID-19. Institutional protocols and algorithms that pertain to the evaluation of patients at risk for COVID-19 are in a state of rapid change based on information released by regulatory bodies including the CDC and federal and state organizations. These policies and algorithms were followed during the patient's care in the ED.   ____________________________________________  FINAL CLINICAL IMPRESSION(S) / ED DIAGNOSES  Final diagnoses:  Insect bite of left forearm, initial encounter       NEW MEDICATIONS STARTED DURING THIS VISIT:  ED Discharge Orders         Ordered    cephALEXin (KEFLEX) 500 MG capsule  2 times daily     11/22/19 1857    diphenhydrAMINE (BENADRYL) 25 mg capsule  Every 4 hours PRN     11/22/19 1857              This chart was dictated using voice recognition software/Dragon. Despite best efforts to proofread, errors can occur which can change the meaning. Any change was purely unintentional.    Laban Emperor, PA-C 11/22/19 2248    Lilia Pro., MD 11/23/19 0001

## 2019-11-22 NOTE — ED Triage Notes (Signed)
Patient has swollen/ raised red area where she reports something bit her yesterday. Pain and swelling worse today

## 2020-03-31 ENCOUNTER — Other Ambulatory Visit: Payer: Self-pay

## 2020-03-31 ENCOUNTER — Ambulatory Visit: Payer: BC Managed Care – PPO | Admitting: Family Medicine

## 2020-03-31 ENCOUNTER — Encounter: Payer: Self-pay | Admitting: Family Medicine

## 2020-03-31 VITALS — BP 116/79 | HR 59 | Temp 98.4°F | Ht 66.14 in | Wt 171.8 lb

## 2020-03-31 DIAGNOSIS — N63 Unspecified lump in unspecified breast: Secondary | ICD-10-CM

## 2020-03-31 NOTE — Progress Notes (Signed)
BP 116/79 (BP Location: Right Arm, Patient Position: Sitting, Cuff Size: Normal)   Pulse (!) 59   Temp 98.4 F (36.9 C) (Oral)   Ht 5' 6.14" (1.68 m)   Wt 171 lb 12.8 oz (77.9 kg)   LMP 05/27/2017 Comment: preg test neg  SpO2 100%   BMI 27.61 kg/m    Subjective:    Patient ID: Haley Garza, female    DOB: 03-17-1976, 44 y.o.   MRN: [id number]  HPI: Haley Garza is a 44 y.o. female  Chief Complaint  Patient presents with  . Breast Mass    left near underarm  . Breast Pain   BREAST PAIN Duration :about a month Location: left Onset: unknown just found during self-exam Severity: mild Quality: sore Frequency: intermittent Redness: no Swelling: no Trauma: no trauma Breastfeeding: no Associated with menstral cycle: no Nipple discharge: no Breast lump: yes Status: stable Treatments attempted: none Previous mammogram: yes  Relevant past medical, surgical, family and social history reviewed and updated as indicated. Interim medical history since our last visit reviewed. Allergies and medications reviewed and updated.  Review of Systems  Constitutional: Positive for fatigue. Negative for activity change, appetite change, chills, diaphoresis, fever and unexpected weight change.  Respiratory: Negative.   Cardiovascular: Negative.   Gastrointestinal: Negative.   Neurological: Negative.   Psychiatric/Behavioral: Negative.     Per HPI unless specifically indicated above     Objective:    BP 116/79 (BP Location: Right Arm, Patient Position: Sitting, Cuff Size: Normal)   Pulse (!) 59   Temp 98.4 F (36.9 C) (Oral)   Ht 5' 6.14" (1.68 m)   Wt 171 lb 12.8 oz (77.9 kg)   LMP 05/27/2017 Comment: preg test neg  SpO2 100%   BMI 27.61 kg/m   Wt Readings from Last 3 Encounters:  03/31/20 171 lb 12.8 oz (77.9 kg)  11/22/19 163 lb (73.9 kg)  07/31/19 161 lb 6 oz (73.2 kg)    Physical Exam Vitals and nursing note reviewed.  Constitutional:      General: She is  not in acute distress.    Appearance: Normal appearance. She is not ill-appearing, toxic-appearing or diaphoretic.  HENT:     Head: Normocephalic and atraumatic.     Right Ear: External ear normal.     Left Ear: External ear normal.     Nose: Nose normal.     Mouth/Throat:     Mouth: Mucous membranes are moist.     Pharynx: Oropharynx is clear.  Eyes:     General: No scleral icterus.       Right eye: No discharge.        Left eye: No discharge.     Extraocular Movements: Extraocular movements intact.     Conjunctiva/sclera: Conjunctivae normal.     Pupils: Pupils are equal, round, and reactive to light.  Cardiovascular:     Rate and Rhythm: Normal rate and regular rhythm.     Pulses: Normal pulses.     Heart sounds: Normal heart sounds. No murmur. No friction rub. No gallop.   Pulmonary:     Effort: Pulmonary effort is normal. No respiratory distress.     Breath sounds: Normal breath sounds. No stridor. No wheezing, rhonchi or rales.  Chest:     Chest wall: No tenderness.     Breasts:        Right: Normal. No swelling, bleeding, inverted nipple, mass, nipple discharge, skin change or tenderness.  Left: Normal. No swelling, bleeding, inverted nipple, mass, nipple discharge, skin change or tenderness.       Comments: Fibrocystic texture to bilateral breasts Musculoskeletal:        General: Normal range of motion.     Cervical back: Normal range of motion and neck supple.  Skin:    General: Skin is warm and dry.     Capillary Refill: Capillary refill takes less than 2 seconds.     Coloration: Skin is not jaundiced or pale.     Findings: No bruising, erythema, lesion or rash.  Neurological:     General: No focal deficit present.     Mental Status: She is alert and oriented to person, place, and time. Mental status is at baseline.  Psychiatric:        Mood and Affect: Mood normal.        Behavior: Behavior normal.        Thought Content: Thought content normal.         Judgment: Judgment normal.     Results for orders placed or performed in visit on 09/21/19  HIV Antibody (routine testing w rflx)  Result Value Ref Range   HIV Screen 4th Generation wRfx Non Reactive Non Reactive  Lipid Panel w/o Chol/HDL Ratio  Result Value Ref Range   Cholesterol, Total 129 100 - 199 mg/dL   Triglycerides 52 0 - 149 mg/dL   HDL 59 >39 mg/dL   VLDL Cholesterol Cal 12 5 - 40 mg/dL   LDL Chol Calc (NIH) 58 0 - 99 mg/dL  Measles/Mumps/Rubella Immunity  Result Value Ref Range   Rubella Antibodies, IGG 2.12 Immune >0.99 index   RUBEOLA AB, IGG 52.8 Immune >16.4 AU/mL   MUMPS ABS, IGG >300.0 Immune >10.9 AU/mL  QuantiFERON-TB Gold Plus  Result Value Ref Range   QuantiFERON Incubation Incubation performed.    QuantiFERON Criteria Comment    QuantiFERON TB1 Ag Value 0.02 IU/mL   QuantiFERON TB2 Ag Value 0.02 IU/mL   QuantiFERON Nil Value 0.03 IU/mL   QuantiFERON Mitogen Value >10.00 IU/mL   QuantiFERON-TB Gold Plus Negative Negative      Assessment & Plan:   Problem List Items Addressed This Visit    None    Visit Diagnoses    Breast lump    -  Primary   Will arrange for [place] and mammogram. Await results. Call with any concerns.    Relevant Orders   MM DIAG BREAST TOMO BILATERAL   US BREAST LTD UNI LEFT INC AXILLA       Follow up plan: Return if symptoms worsen or fail to improve.

## 2020-03-31 NOTE — Patient Instructions (Signed)
Call to schedule your mammogram: [facility]  Address: [address], [place], [place] [address]  Phone: [phone]  

## 2020-04-04 ENCOUNTER — Ambulatory Visit
Admission: RE | Admit: 2020-04-04 | Discharge: 2020-04-04 | Disposition: A | Discharge status: Home / Self Care | Payer: BC Managed Care – PPO | Source: Ambulatory Visit | Attending: Family Medicine | Admitting: Family Medicine

## 2020-04-04 ENCOUNTER — Other Ambulatory Visit: Payer: Self-pay | Admitting: Family Medicine

## 2020-04-04 DIAGNOSIS — N63 Unspecified lump in unspecified breast: Secondary | ICD-10-CM

## 2020-06-21 ENCOUNTER — Telehealth: Payer: Self-pay

## 2020-06-21 NOTE — Telephone Encounter (Signed)
Pt calling; had partial hyst ~59yrs ago; has had cramping the last 2d; today started having a bloody show.  [phone]

## 2020-06-22 NOTE — Telephone Encounter (Signed)
Patient scheduled for 06/29/20 with AMS in [facility]

## 2020-06-30 ENCOUNTER — Ambulatory Visit (INDEPENDENT_AMBULATORY_CARE_PROVIDER_SITE_OTHER): Payer: BC Managed Care – PPO | Admitting: Obstetrics and Gynecology

## 2020-06-30 ENCOUNTER — Other Ambulatory Visit: Payer: Self-pay

## 2020-06-30 ENCOUNTER — Encounter: Payer: Self-pay | Admitting: Obstetrics and Gynecology

## 2020-06-30 VITALS — BP 118/74 | HR 58 | Ht 67.0 in | Wt 173.0 lb

## 2020-06-30 DIAGNOSIS — N939 Abnormal uterine and vaginal bleeding, unspecified: Secondary | ICD-10-CM

## 2020-06-30 NOTE — Progress Notes (Signed)
Obstetrics & Gynecology Office Visit   Chief Complaint:  Chief Complaint  Patient presents with  . Vaginal Bleeding    spotting on toilet tissue, cramping x few weeks    History of Present Illness: 44 y.o. G3P3003 s.p [place] 2018 presenting with a self limited episode of vaginal spotting 3 weeks ago.  No bleeding since.  Coincided with treatment for E. Coli UTI. Denies intercourse preceding episode or other trauma/or inciting event.  No pain associated with the episode.     Hysterectomy was for benign indication with normal pathology.  Review of Systems: Review of Systems  Constitutional: Negative.   Gastrointestinal: Negative.   Genitourinary: Negative.      Past Medical History:  Past Medical History:  Diagnosis Date  . Anemia   . Anxiety   . Arthritis   . Depression   . History of kidney stones   . IFG (impaired fasting glucose)   . Obesity   . PONV (postoperative nausea and vomiting)   . Tobacco abuse     Past Surgical History:  Past Surgical History:  Procedure Laterality Date  . BARIATRIC SURGERY    . CESAREAN SECTION     x 2   . CYSTOSCOPY N/A 06/18/2017   Procedure: CYSTOSCOPY;  Surgeon: [name], MD;  Location: [facility] ORS;  Service: Gynecology;  Laterality: N/A;  . INTRAUTERINE DEVICE (IUD) INSERTION     feb 2014  . KNEE ARTHROSCOPY Right   . LAPAROSCOPIC HYSTERECTOMY N/A 06/18/2017   Procedure: HYSTERECTOMY TOTAL LAPAROSCOPIC;  Surgeon: [name], MD;  Location: [facility] ORS;  Service: Gynecology;  Laterality: N/A;  . TUBAL LIGATION    . UTERINE FIBROID EMBOLIZATION  01/09/2017   Triangular vascular    Gynecologic History: Patient's last menstrual period was 05/27/2017.  Obstetric History: [id number]  Family History:  Family History  Problem Relation Age of Onset  . Hypertension Mother   . Hyperlipidemia Mother   . Depression Mother   . Fibroids Mother   . Breast cancer Neg Hx     Social History:  Social History   Socioeconomic  History  . Marital status: Divorced    Spouse name: Not on file  . Number of children: Not on file  . Years of education: Not on file  . Highest education level: Not on file  Occupational History  . Not on file  Tobacco Use  . Smoking status: Former Smoker    Packs/day: 0.50    Types: Cigarettes  . Smokeless tobacco: Never Used  Vaping Use  . Vaping Use: Never used  Substance and Sexual Activity  . Alcohol use: Yes    Comment: occ  . Drug use: No  . Sexual activity: Not Currently    Birth control/protection: None    Comment: Hysterectomy 2018  Other Topics Concern  . Not on file  Social History Narrative  . Not on file   Social Determinants of Health   Financial Resource Strain:   . Difficulty of Paying Living Expenses: Not on file  Food Insecurity:   . Worried About Charity fundraiser in the Last Year: Not on file  . Ran Out of Food in the Last Year: Not on file  Transportation Needs:   . Lack of Transportation (Medical): Not on file  . Lack of Transportation (Non-Medical): Not on file  Physical Activity:   . Days of Exercise per Week: Not on file  . Minutes of Exercise per Session: Not on file  Stress:   .  Feeling of Stress : Not on file  Social Connections:   . Frequency of Communication with Friends and Family: Not on file  . Frequency of Social Gatherings with Friends and Family: Not on file  . Attends Religious Services: Not on file  . Active Member of Clubs or Organizations: Not on file  . Attends Archivist Meetings: Not on file  . Marital Status: Not on file  Intimate Partner Violence:   . Fear of Current or Ex-Partner: Not on file  . Emotionally Abused: Not on file  . Physically Abused: Not on file  . Sexually Abused: Not on file    Allergies:  Allergies  Allergen Reactions  . Honey Bee Treatment [Bee Venom] Itching and Swelling  . Oxycodone-Acetaminophen Hives and Itching    Medications: Prior to Admission medications   Medication  Sig Start Date End Date Taking? Authorizing Provider  Ascorbic Acid (VITAMIN C) 1000 MG tablet Take 1,000 mg by mouth daily. Chewable   Yes [provider]  b complex vitamins tablet Take 1 tablet by mouth daily.   Yes [provider]  Biotin 5000 MCG CAPS Take 1 capsule by mouth daily.   Yes [provider]  Calcium-Magnesium-Zinc [phone] MG TABS Take by mouth.   Yes [provider]  Cholecalciferol (VITAMIN D3) 5000 units CAPS Take 5,000 Units by mouth daily.   Yes [provider]  ferrous sulfate 325 (65 FE) MG tablet Take 65 mg by mouth daily with breakfast.   Yes [provider]  Multiple Vitamin (MULTIVITAMIN) tablet Take 1 tablet by mouth daily.   Yes [provider]  vitamin A 8000 UNIT capsule Take 8,000 Units by mouth daily.   Yes [provider]  vitamin E 200 UNIT capsule Take 200 Units by mouth daily.   Yes [provider]  VITAMIN K PO Take 100 mcg by mouth daily.   Yes [provider]    Physical Exam Vitals:  Vitals:   06/30/20 1408  BP: 118/74  Pulse: (!) 58   Patient's last menstrual period was 05/27/2017.  General: NAD HEENT: normocephalic, anicteric Pulmonary: No increased work of breathing Genitourinary:  External: Normal external female genitalia.  Normal urethral meatus, normal  Bartholin's and Skene's glands.    Vagina: Normal vaginal mucosa, no evidence of prolapse.    Cervix: surgically absent, vaginal cuff intact, no evidence of stitch abscess, cuff dehiscence, or retained suture material.  No bleeding.  Uterus: surgically absent  Adnexa: ovaries non-enlarged, no adnexal masses  Rectal: deferred  Lymphatic: no evidence of inguinal lymphadenopathy Extremities: no edema, erythema, or tenderness Neurologic: Grossly intact Psychiatric: mood appropriate, affect full  Female chaperone present for pelvic  portions of the physical exam  Assessment: 44 y.o. [id number] with  vaginal bleeding status post prior hysterectomy  Plan: Problem List Items Addressed This Visit    None    Visit Diagnoses    Abnormal vaginal bleeding    -  Primary     1) Abnormal vaginal bleeding - no evidence of cuff dehiscence, vaginal lesion, or current bleeding.  No bleeding since initial episode - expectant management - should another bleeding episode occur we discussed that it would be helpful to evaluate patient during active bleeding  2) A total of 15 minutes were spent in face-to-face contact with the patient during this encounter with over half of that time devoted to counseling and coordination of care.  3) Return in about 1 year (around 06/30/2021) for annual.  [name], MD, [facility], [facility] 06/30/2020, 2:34 PM

## 2020-07-19 ENCOUNTER — Other Ambulatory Visit: Payer: Self-pay | Admitting: Family Medicine

## 2020-07-19 DIAGNOSIS — N6320 Unspecified lump in the left breast, unspecified quadrant: Secondary | ICD-10-CM

## 2020-09-19 ENCOUNTER — Other Ambulatory Visit: Payer: BC Managed Care – PPO

## 2020-11-01 ENCOUNTER — Emergency Department: Payer: BC Managed Care – PPO

## 2020-11-01 ENCOUNTER — Encounter: Payer: Self-pay | Admitting: Emergency Medicine

## 2020-11-01 ENCOUNTER — Emergency Department
Admission: EM | Admit: 2020-11-01 | Discharge: 2020-11-02 | Disposition: A | Discharge status: Home / Self Care | Payer: BC Managed Care – PPO | Attending: Emergency Medicine | Admitting: Emergency Medicine

## 2020-11-01 ENCOUNTER — Other Ambulatory Visit: Payer: Self-pay

## 2020-11-01 DIAGNOSIS — J45909 Unspecified asthma, uncomplicated: Secondary | ICD-10-CM | POA: Insufficient documentation

## 2020-11-01 DIAGNOSIS — J069 Acute upper respiratory infection, unspecified: Secondary | ICD-10-CM | POA: Diagnosis not present

## 2020-11-01 DIAGNOSIS — U071 COVID-19: Secondary | ICD-10-CM | POA: Diagnosis not present

## 2020-11-01 DIAGNOSIS — F17210 Nicotine dependence, cigarettes, uncomplicated: Secondary | ICD-10-CM | POA: Diagnosis not present

## 2020-11-01 DIAGNOSIS — R059 Cough, unspecified: Secondary | ICD-10-CM | POA: Diagnosis present

## 2020-11-01 LAB — POC SARS CORONAVIRUS 2 AG -  ED: SARS Coronavirus 2 Ag: NEGATIVE

## 2020-11-01 MED ORDER — ACETAMINOPHEN 500 MG PO TABS
1000.0000 mg | ORAL_TABLET | Freq: Once | ORAL | Status: AC
  Administered 2020-11-01: 1000 mg via ORAL
  Filled 2020-11-01: qty 2

## 2020-11-01 MED ORDER — ALBUTEROL SULFATE HFA 108 (90 BASE) MCG/ACT IN AERS: 2.0000 | INHALATION_SPRAY | Freq: Four times a day (QID) | RESPIRATORY_TRACT | 0 refills | Status: DC | PRN

## 2020-11-01 MED ORDER — BENZONATATE 100 MG PO CAPS: 100.0000 mg | ORAL_CAPSULE | Freq: Three times a day (TID) | ORAL | 0 refills | Status: DC | PRN

## 2020-11-01 MED ORDER — BENZONATATE 100 MG PO CAPS
100.0000 mg | ORAL_CAPSULE | Freq: Once | ORAL | Status: AC
  Administered 2020-11-01: 100 mg via ORAL
  Filled 2020-11-01: qty 1

## 2020-11-01 NOTE — ED Provider Notes (Signed)
[facility] Emergency Department Provider Note   ____________________________________________   Event Date/Time   First MD Initiated Contact with Patient 11/01/20 2150     (approximate)  I have reviewed the triage vital signs and the nursing notes.   HISTORY  Chief Complaint Fever, Chills, and Generalized Body Aches  HPI Haley Garza is a 45 y.o. female who presents to the emergency department for evaluation of body aches, headache and cough that began this morning.  She denies any known fever.  She is vaccinated against COVID-19.  She denies any known Covid contacts.  At this time, she is denying any chest pain, shortness of breath, abdominal pain, nausea, vomiting or diarrhea.  She has tried ibuprofen for her headache and body aches with minimal relief.           Past Medical History:  Diagnosis Date  . Anemia   . Anxiety   . Arthritis   . Depression   . History of kidney stones   . IFG (impaired fasting glucose)   . Obesity   . PONV (postoperative nausea and vomiting)   . Tobacco abuse     Patient Active Problem List   Diagnosis Date Noted  . S/P laparoscopic hysterectomy 06/18/2017  . 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 Bariatric surgery status 09/14/2015  . Asthma 09/14/2015  . Anxiety and depression 09/14/2015  . Tobacco abuse 09/14/2015  . IFG (impaired fasting glucose) 09/14/2015    Past Surgical History:  Procedure Laterality Date  . ABDOMINAL HYSTERECTOMY    . BARIATRIC SURGERY    . CESAREAN SECTION     x 2   . CYSTOSCOPY N/A 06/18/2017   Procedure: CYSTOSCOPY;  Surgeon: [name], MD;  Location: [facility] ORS;  Service: Gynecology;  Laterality: N/A;  . INTRAUTERINE DEVICE (IUD) INSERTION     feb 2014  . KNEE ARTHROSCOPY Right   . LAPAROSCOPIC HYSTERECTOMY N/A 06/18/2017   Procedure: HYSTERECTOMY TOTAL LAPAROSCOPIC;  Surgeon: [name], MD;  Location: [facility] ORS;  Service: Gynecology;  Laterality: N/A;  . TUBAL  LIGATION    . UTERINE FIBROID EMBOLIZATION  01/09/2017   Triangular vascular    Prior to Admission medications   Medication Sig Start Date End Date Taking? Authorizing Provider  albuterol (VENTOLIN HFA) 108 (90 Base) MCG/ACT inhaler Inhale 2 puffs into the lungs every 6 (six) hours as needed for wheezing or shortness of breath. 11/01/20  Yes Marlana Salvage, PA  benzonatate (TESSALON PERLES) 100 MG capsule Take 1 capsule (100 mg total) by mouth 3 (three) times daily as needed for cough. 11/01/20 11/01/21 Yes Cheresa Siers, Farrel Gordon, PA  Ascorbic Acid (VITAMIN C) 1000 MG tablet Take 1,000 mg by mouth daily. Chewable    [provider]  b complex vitamins tablet Take 1 tablet by mouth daily.    [provider]  Biotin 5000 MCG CAPS Take 1 capsule by mouth daily.    [provider]  Calcium-Magnesium-Zinc [phone] MG TABS Take by mouth.    [provider]  Cholecalciferol (VITAMIN D3) 5000 units CAPS Take 5,000 Units by mouth daily.    [provider]  ferrous sulfate 325 (65 FE) MG tablet Take 65 mg by mouth daily with breakfast.    [provider]  Multiple Vitamin (MULTIVITAMIN) tablet Take 1 tablet by mouth daily.    [provider]  vitamin A 8000 UNIT capsule Take 8,000 Units by mouth daily.    [provider]  vitamin E 200 UNIT  capsule Take 200 Units by mouth daily.    [provider]  VITAMIN K PO Take 100 mcg by mouth daily.    [provider]    Allergies Honey bee treatment [bee venom] and Oxycodone-acetaminophen  Family History  Problem Relation Age of Onset  . Hypertension Mother   . Hyperlipidemia Mother   . Depression Mother   . Fibroids Mother   . Breast cancer Neg Hx     Social History Social History   Tobacco Use  . Smoking status: Current Every Day Smoker    Packs/day: 0.50    Types: Cigarettes  . Smokeless tobacco: Never Used  Vaping Use  . Vaping Use: Never used  Substance  Use Topics  . Alcohol use: Yes    Comment: occ  . Drug use: No    Review of Systems Constitutional: + Body aches, no fever/chills Eyes: No visual changes. ENT: No sore throat. Cardiovascular: Denies chest pain. Respiratory: + Cough, denies shortness of breath. Gastrointestinal: No abdominal pain.  No nausea, no vomiting.  No diarrhea.  No constipation. Genitourinary: Negative for dysuria. Musculoskeletal: Negative for back pain. Skin: Negative for rash. Neurological: + headaches, negative for focal weakness or numbness.   ____________________________________________   PHYSICAL EXAM:  VITAL SIGNS: ED Triage Vitals  Enc Vitals Group     BP 11/01/20 2039 (!) 103/58     Pulse Rate 11/01/20 2039 (!) 57     Resp 11/01/20 2039 16     Temp 11/01/20 2039 99.4 F (37.4 C)     Temp Source 11/01/20 2039 Oral     SpO2 --      Weight 11/01/20 2035 175 lb (79.4 kg)     Height 11/01/20 2035 5\' 7"  (1.702 m)     Head Circumference --      Peak Flow --      Pain Score 11/01/20 2034 6     Pain Loc --      Pain Edu? --      Excl. in [place]? --     Constitutional: Alert and oriented. Well appearing and in no acute distress. Eyes: Conjunctivae are normal. PERRL. EOMI. Head: Atraumatic. Nose: No congestion/rhinnorhea. Mouth/Throat: Mucous membranes are moist.  Oropharynx non-erythematous. Neck: No stridor.   Lymphatic: Diffuse cervical lymphadenopathy Cardiovascular: Normal rate, regular rhythm. Grossly normal heart sounds.  Good peripheral circulation. Respiratory: Normal respiratory effort.  No retractions. Lungs CTAB. Gastrointestinal: Soft and nontender. No distention. No abdominal bruits. No CVA tenderness. Musculoskeletal: No lower extremity tenderness nor edema.  No joint effusions. Neurologic:  Normal speech and language. No gross focal neurologic deficits are appreciated. No gait instability. Skin:  Skin is warm, dry and intact. No rash noted. Psychiatric: Mood and affect are  normal. Speech and behavior are normal.  ____________________________________________   LABS (all labs ordered are listed, but only abnormal results are displayed)  Labs Reviewed  SARS CORONAVIRUS 2 (TAT 6-24 HRS) - Abnormal; Notable for the following components:      Result Value   SARS Coronavirus 2 POSITIVE (*)    All other components within normal limits  POC SARS CORONAVIRUS 2 AG -  ED   ____________________________________________  RADIOLOGY Lenoria Farrier, personally viewed and evaluated these images (plain radiographs) as part of my medical decision making, as well as reviewing the written report by the radiologist.  ED provider interpretation: No acute process noted  Official radiology report(s): DG Chest 2 View  Result Date: 11/01/2020 CLINICAL DATA:  Productive  cough EXAM: CHEST - 2 VIEW COMPARISON:  01/18/2015 FINDINGS: The heart size and mediastinal contours are within normal limits. Both lungs are clear. The visualized skeletal structures are unremarkable. IMPRESSION: No active cardiopulmonary disease. Electronically Signed   By: Donavan Foil M.D.   On: 11/01/2020 20:54    ___________________________________________   INITIAL IMPRESSION / ASSESSMENT AND PLAN / ED COURSE  As part of my medical decision making, I reviewed the following data within the [place] notes reviewed and incorporated, Labs reviewed  and Radiograph reviewed        Patient is a 45 year old female who presents to the emergency department for evaluation of headaches, body aches and cough that have been present just today.  Patient is Covid vaccinated and has not had any known contacts.  See HPI for further details.  In triage, the patient has a temperature of 99.4, is mildly bradycardic at 57 bpm and is mildly hypotensive at 103/58.  Respirations and SPO2 is normal.  On physical exam, the patient does look tired and has diffuse cervical lymphadenopathy but no other  abnormalities are noted on physical exam.  Rapid antigen test is performed against Covid and is negative.  Chest x-ray does not reveal any acute infiltrates.  A 6 to 24-hour PCR will be sent for Covid given high suspicion based on the patient's presentation.  Upon discharge, the patient does remain mildly hypotensive at 101/59.  This appears only mildly decreased from her norm of around 110/70 based on review of previous records.  In addition, the patient does not have any other vital criteria to meet SIRS or sepsis markers.  The patient's bradycardia is stable for her known sinus bradycardia diagnosis.  Discussed symptomatic treatment with the patient of Tylenol and ibuprofen as well as Tessalon Perles for her cough.  She also request a refill of her albuterol that she uses as needed which was provided.  The patient is stable this time for outpatient therapy.  She will be notified of her Covid 6 to 24-hour results when they are available.  Patient was instructed to return with any acute worsening.      ____________________________________________   FINAL CLINICAL IMPRESSION(S) / ED DIAGNOSES  Final diagnoses:  Viral upper respiratory tract infection     ED Discharge Orders         Ordered    albuterol (VENTOLIN HFA) 108 (90 Base) MCG/ACT inhaler  Every 6 hours PRN        11/01/20 2246    benzonatate (TESSALON PERLES) 100 MG capsule  3 times daily PRN        11/01/20 2246          *Please note:  Kory Miggins was evaluated in Emergency Department on 11/02/2020 for the symptoms described in the history of present illness. She was evaluated in the context of the global COVID-19 pandemic, which necessitated consideration that the patient might be at risk for infection with the SARS-CoV-2 virus that causes COVID-19. Institutional protocols and algorithms that pertain to the evaluation of patients at risk for COVID-19 are in a state of rapid change based on information released by regulatory  bodies including the CDC and federal and state organizations. These policies and algorithms were followed during the patient's care in the ED.  Some ED evaluations and interventions may be delayed as a result of limited staffing during and the pandemic.*   Note:  This document was prepared using Dragon voice recognition software and may include  unintentional dictation errors.    Lucy Chris, PA 11/02/20 1556    Arnaldo Natal, MD 11/02/20 [phone]

## 2020-11-01 NOTE — ED Triage Notes (Signed)
Pt to ED from home c/o cough, fever, body aches that started this morning when she woke up.  Productive cough.  States took IBU at home around 1830 for fever of 101.7 today.  Pt A&Ox4, chest rise even and unlabored, in NAD at this time.

## 2020-11-01 NOTE — ED Notes (Signed)
Pt updated on swab results.

## 2020-11-02 LAB — SARS CORONAVIRUS 2 (TAT 6-24 HRS): SARS Coronavirus 2: POSITIVE — AB

## 2020-11-07 ENCOUNTER — Telehealth (INDEPENDENT_AMBULATORY_CARE_PROVIDER_SITE_OTHER): Payer: BC Managed Care – PPO | Admitting: Family Medicine

## 2020-11-07 ENCOUNTER — Telehealth: Payer: Self-pay | Admitting: Family Medicine

## 2020-11-07 ENCOUNTER — Encounter: Payer: Self-pay | Admitting: Family Medicine

## 2020-11-07 DIAGNOSIS — U071 COVID-19: Secondary | ICD-10-CM

## 2020-11-07 MED ORDER — SUCRALFATE 1 G PO TABS: 1.0000 g | ORAL_TABLET | Freq: Three times a day (TID) | ORAL | 1 refills | Status: DC

## 2020-11-07 MED ORDER — PREDNISONE 10 MG PO TABS: ORAL_TABLET | ORAL | 0 refills | Status: DC

## 2020-11-07 MED ORDER — HYDROCOD POLST-CPM POLST ER 10-8 MG/5ML PO SUER: 5.0000 mL | Freq: Two times a day (BID) | ORAL | 0 refills | Status: DC | PRN

## 2020-11-07 NOTE — Progress Notes (Signed)
LMP 05/27/2017 Comment: preg test neg   Subjective:    Patient ID: Jackelyn Poling, female    DOB: [birth year], 45 y.o.   MRN: [id number]  HPI: Gwendolen Hewlett is a 45 y.o. female  Chief Complaint  Patient presents with  . Covid Positive    Patient states she started feeling bad last Monday and tested on Tuesday. Received positive result on Thursday. Fatigue, weakness, dizziness, body aches, and headache are the worse symptoms currently for the patient.   UPPER RESPIRATORY TRACT INFECTION Duration: about a week Worst symptom: fatigue, body aches, headache Fever: yes Cough: yes Shortness of breath: no Wheezing: no Chest pain: no Chest tightness: no Chest congestion: yes Nasal congestion: yes Runny nose: yes Post nasal drip: yes Sneezing: no Sore throat: yes Swollen glands: no Sinus pressure: yes Headache: yes Face pain: no Toothache: yes Ear pain: no  Ear pressure: yes bilateral Eyes red/itching:yes Eye drainage/crusting: no  Vomiting: no Rash: no Fatigue: yes Sick contacts: yes Strep contacts: no  Context: stable Recurrent sinusitis: no Relief with OTC cold/cough medications: no  Treatments attempted: cold/sinus   Relevant past medical, surgical, family and social history reviewed and updated as indicated. Interim medical history since our last visit reviewed. Allergies and medications reviewed and updated.  Review of Systems  Constitutional: Positive for chills and fatigue. Negative for activity change, appetite change, diaphoresis, fever and unexpected weight change.  HENT: Positive for congestion and ear pain. Negative for dental problem, drooling, ear discharge, facial swelling, hearing loss, mouth sores, nosebleeds, postnasal drip, rhinorrhea, sinus pressure, sinus pain, sneezing, sore throat, tinnitus, trouble swallowing and voice change.   Respiratory: Positive for cough. Negative for apnea, choking, chest tightness, shortness of breath, wheezing and  stridor.   Cardiovascular: Negative.   Gastrointestinal: Negative.   Musculoskeletal: Negative.   Skin: Negative.   Neurological: Negative.   Psychiatric/Behavioral: Positive for dysphoric mood. Negative for agitation, behavioral problems, confusion, decreased concentration, hallucinations, self-injury, sleep disturbance and suicidal ideas. The patient is not nervous/anxious and is not hyperactive.     Per HPI unless specifically indicated above     Objective:    LMP 05/27/2017 Comment: preg test neg  Wt Readings from Last 3 Encounters:  11/01/20 175 lb (79.4 kg)  06/30/20 173 lb (78.5 kg)  03/31/20 171 lb 12.8 oz (77.9 kg)    Physical Exam Vitals and nursing note reviewed.  Constitutional:      General: She is not in acute distress.    Appearance: Normal appearance. She is not ill-appearing, toxic-appearing or diaphoretic.  HENT:     Head: Normocephalic and atraumatic.     Right Ear: External ear normal.     Left Ear: External ear normal.     Nose: Nose normal.     Mouth/Throat:     Mouth: Mucous membranes are moist.     Pharynx: Oropharynx is clear.  Eyes:     General: No scleral icterus.       Right eye: No discharge.        Left eye: No discharge.     Conjunctiva/sclera: Conjunctivae normal.     Pupils: Pupils are equal, round, and reactive to light.  Pulmonary:     Effort: Pulmonary effort is normal. No respiratory distress.     Comments: Speaking in full sentences Musculoskeletal:        General: Normal range of motion.     Cervical back: Normal range of motion.  Skin:    Coloration: Skin is not  jaundiced or pale.     Findings: No bruising, erythema, lesion or rash.  Neurological:     Mental Status: She is alert and oriented to person, place, and time. Mental status is at baseline.  Psychiatric:        Mood and Affect: Mood normal. Affect is tearful.        Behavior: Behavior normal.        Thought Content: Thought content normal.        Judgment: Judgment  normal.     Results for orders placed or performed during the hospital encounter of 11/01/20  SARS CORONAVIRUS 2 (TAT 6-24 HRS) Nasopharyngeal Nasopharyngeal Swab   Specimen: Nasopharyngeal Swab  Result Value Ref Range   SARS Coronavirus 2 POSITIVE (A) NEGATIVE  POC SARS Coronavirus 2 Ag-ED - Nasal Swab (BD Veritor Kit)  Result Value Ref Range   SARS Coronavirus 2 Ag Negative Negative      Assessment & Plan:   Problem List Items Addressed This Visit   None   Visit Diagnoses    COVID-19    -  Primary   Self-quarantine until 1/14- out of work unitl 1/17. Will treat symptomatically w/ carafate, tussionex and prednisone. Call with any concerns/not improving       Follow up plan: Return if symptoms worsen or fail to improve.   . This visit was completed via MyChart due to the restrictions of the COVID-19 pandemic. All issues as above were discussed and addressed. Physical exam was done as above through visual confirmation on MyChart. If it was felt that the patient should be evaluated in the office, they were directed there. The patient verbally consented to this visit. . Location of the patient: home . Location of the provider: home . Those involved with this call:  . Provider: Park Liter, DO . CMA: Yvonna Alanis, [place] . Front Desk/Registration: Jill Side  . Time spent on call: 15 minutes with patient face to face via video conference. More than 50% of this time was spent in counseling and coordination of care. 23 minutes total spent in review of patient's record and preparation of their chart.

## 2020-11-07 NOTE — Telephone Encounter (Unsigned)
Copied from [place] [id number]. Topic: General - Other >> Nov 07, 2020  1:21 PM Tessa Lerner A wrote: Reason for CRM: patient would like a doctors note continuing their absence from work Patient was seen on 11/01/20 in the ED of [facility] and received a positive COVID test

## 2020-11-07 NOTE — Telephone Encounter (Signed)
Scheduled virtual today at 4:20

## 2020-11-15 ENCOUNTER — Telehealth: Payer: BC Managed Care – PPO | Admitting: Family Medicine

## 2020-12-20 ENCOUNTER — Encounter: Payer: Self-pay | Admitting: Family Medicine

## 2020-12-20 ENCOUNTER — Telehealth (INDEPENDENT_AMBULATORY_CARE_PROVIDER_SITE_OTHER): Payer: BC Managed Care – PPO | Admitting: Family Medicine

## 2020-12-20 DIAGNOSIS — Z720 Tobacco use: Secondary | ICD-10-CM

## 2020-12-20 DIAGNOSIS — R5382 Chronic fatigue, unspecified: Secondary | ICD-10-CM | POA: Diagnosis not present

## 2020-12-20 MED ORDER — BUPROPION HCL ER (SR) 150 MG PO TB12: ORAL_TABLET | ORAL | 3 refills | Status: DC

## 2020-12-20 NOTE — Assessment & Plan Note (Signed)
Would like to quit. Starting wellbutrin. Discussed smoking cessation strategies. Call with any concerns. Recheck 1 month.

## 2020-12-20 NOTE — Progress Notes (Signed)
LMP 05/27/2017 Comment: preg test neg   Subjective:    Patient ID: Haley Garza, female    DOB: 1976/07/02, 45 y.o.   MRN: [id number]  HPI: Haley Garza is a 45 y.o. female  Chief Complaint  Patient presents with  . Fatigue    Patient states for about a week and a half she has been feeling very tired. Patient states she is sleeping well but still feels very tired. Patient states she doesn't feel like doing anything, doesn't find any interest in doing things.  . Nicotine Dependence    Patient states she would like help with stopping smoking.    FATIGUE- has been noticing that she has been really tired and sleepy, but also having issues with motivation and not wanting to do anything. Had covid about a month ago- got over that 100% and then this happened Duration:  About a week and a half Severity: moderate  Onset: sudden Context when symptoms started:  none Symptoms improve with rest: no  Depressive symptoms: yes Stress/anxiety: yes Insomnia: no  Snoring: no Observed apnea by bed partner: no Daytime hypersomnolence:yes Wakes feeling refreshed: no History of sleep study: no Dysnea on exertion:  yes Orthopnea/PND: no Chest pain: no Chronic cough: no Lower extremity edema: no Arthralgias:yes Myalgias: no Weakness: yes Rash: no Depression screen [facility] 2/9 12/20/2020 07/31/2019  Decreased Interest 1 0  Down, Depressed, Hopeless 1 0  PHQ - 2 Score 2 0  Altered sleeping 3 -  Tired, decreased energy 3 -  Change in appetite 0 -  Feeling bad or failure about yourself  0 -  Trouble concentrating 1 -  Moving slowly or fidgety/restless 0 -  Suicidal thoughts 0 -  PHQ-9 Score 9 -  Difficult doing work/chores Somewhat difficult -   SMOKING CESSATION Smoking Status: every day smoker Smoking Amount: 1/2ppd Smoking Quit Date: Next week Smoking triggers: stress  Type of tobacco use: cigarettes Children in the house: no Other household members who smoke: no Treatments  attempted: wellbutrin, patches, gum Pneumovax: UTD   Relevant past medical, surgical, family and social history reviewed and updated as indicated. Interim medical history since our last visit reviewed. Allergies and medications reviewed and updated.  Review of Systems  Constitutional: Positive for fatigue. Negative for activity change, appetite change, chills, diaphoresis, fever and unexpected weight change.  Respiratory: Negative.   Cardiovascular: Negative.   Gastrointestinal: Negative.   Musculoskeletal: Negative.   Skin: Negative.   Neurological: Negative.   Psychiatric/Behavioral: Positive for dysphoric mood and sleep disturbance. Negative for agitation, behavioral problems, confusion, decreased concentration, hallucinations, self-injury and suicidal ideas. The patient is not nervous/anxious and is not hyperactive.     Per HPI unless specifically indicated above     Objective:    LMP 05/27/2017 Comment: preg test neg  Wt Readings from Last 3 Encounters:  11/01/20 175 lb (79.4 kg)  06/30/20 173 lb (78.5 kg)  03/31/20 171 lb 12.8 oz (77.9 kg)    Physical Exam Vitals and nursing note reviewed.  Constitutional:      General: She is not in acute distress.    Appearance: Normal appearance. She is not ill-appearing, toxic-appearing or diaphoretic.  HENT:     Head: Normocephalic and atraumatic.     Right Ear: External ear normal.     Left Ear: External ear normal.     Nose: Nose normal.     Mouth/Throat:     Mouth: Mucous membranes are moist.     Pharynx: Oropharynx is  clear.  Eyes:     General: No scleral icterus.       Right eye: No discharge.        Left eye: No discharge.     Conjunctiva/sclera: Conjunctivae normal.     Pupils: Pupils are equal, round, and reactive to light.  Pulmonary:     Effort: Pulmonary effort is normal. No respiratory distress.     Comments: Speaking in full sentences Musculoskeletal:        General: Normal range of motion.     Cervical  back: Normal range of motion.  Skin:    Coloration: Skin is not jaundiced or pale.     Findings: No bruising, erythema, lesion or rash.  Neurological:     Mental Status: She is alert and oriented to person, place, and time. Mental status is at baseline.  Psychiatric:        Mood and Affect: Mood normal.        Behavior: Behavior normal.        Thought Content: Thought content normal.        Judgment: Judgment normal.     Results for orders placed or performed during the hospital encounter of 11/01/20  SARS CORONAVIRUS 2 (TAT 6-24 HRS) Nasopharyngeal Nasopharyngeal Swab   Specimen: Nasopharyngeal Swab  Result Value Ref Range   SARS Coronavirus 2 POSITIVE (A) NEGATIVE  POC SARS Coronavirus 2 Ag-ED - Nasal Swab (BD Veritor Kit)  Result Value Ref Range   SARS Coronavirus 2 Ag Negative Negative      Assessment & Plan:   Problem List Items Addressed This Visit      Other   Tobacco abuse    Would like to quit. Starting wellbutrin. Discussed smoking cessation strategies. Call with any concerns. Recheck 1 month.        Other Visit Diagnoses    Chronic fatigue    -  Primary   Possibly her mood acting up. Will check labs. Await results. Starting wellbutrin for smoking. Recheck 1 month. Call with any concerns.    Relevant Orders   CBC with Differential/Platelet   Comprehensive metabolic panel   VITAMIN D 25 Hydroxy (Vit-D Deficiency, Fractures)   TSH   Lyme Ab/Western Blot Reflex   Rocky mtn spotted fvr abs pnl(IgG+IgM)   Babesia microti Antibody Panel   Ehrlichia Antibody Panel       Follow up plan: Return in about 4 weeks (around 01/17/2021).   . This visit was completed via MyChart due to the restrictions of the COVID-19 pandemic. All issues as above were discussed and addressed. Physical exam was done as above through visual confirmation on MyChart. If it was felt that the patient should be evaluated in the office, they were directed there. The patient verbally consented  to this visit. . Location of the patient: work . Location of the provider: home . Those involved with this call:  . Provider: Park Liter, DO . CMA: Louanna Raw, [place] . Front Desk/Registration: Jill Side  . Time spent on call: 25 minutes with patient face to face via video conference. More than 50% of this time was spent in counseling and coordination of care. 40 minutes total spent in review of patient's record and preparation of their chart.

## 2021-01-18 ENCOUNTER — Ambulatory Visit: Payer: BC Managed Care – PPO | Admitting: Family Medicine

## 2021-03-03 ENCOUNTER — Ambulatory Visit (INDEPENDENT_AMBULATORY_CARE_PROVIDER_SITE_OTHER): Payer: BC Managed Care – PPO | Admitting: Family Medicine

## 2021-03-03 ENCOUNTER — Encounter: Payer: Self-pay | Admitting: Family Medicine

## 2021-03-03 ENCOUNTER — Other Ambulatory Visit: Payer: Self-pay

## 2021-03-03 VITALS — BP 104/65 | HR 67 | Temp 98.8°F | Wt 185.6 lb

## 2021-03-03 DIAGNOSIS — Z720 Tobacco use: Secondary | ICD-10-CM

## 2021-03-03 DIAGNOSIS — R5382 Chronic fatigue, unspecified: Secondary | ICD-10-CM | POA: Diagnosis not present

## 2021-03-03 MED ORDER — BUPROPION HCL ER (SR) 150 MG PO TB12: 150.0000 mg | ORAL_TABLET | Freq: Two times a day (BID) | ORAL | 1 refills | Status: DC

## 2021-03-03 NOTE — Assessment & Plan Note (Signed)
Down to 2 cigarettes some days! Doing great! Continue wellbutrin. Call with any concerns. Continue to monitor.

## 2021-03-03 NOTE — Progress Notes (Signed)
BP 104/65   Pulse 67   Temp 98.8 F (37.1 C)   Wt 185 lb 9.6 oz (84.2 kg)   LMP 05/27/2017 Comment: preg test neg  SpO2 99%   BMI 29.07 kg/m    Subjective:    Patient ID: Haley Garza, female    DOB: [birth year], 45 y.o.   MRN: [id number]  HPI: Haley Garza is a 45 y.o. female  Chief Complaint  Patient presents with  . Fatigue    Patient states she stays tired. Has discussed with provider in February. Did not follow up or get labs drawn.    FATIGUE- feels like her mood is a little better since starting the wellbutrin, but fatigue has not changed. Has had a lot going on at home Duration:  chronic Severity: moderate  Onset: sudden Context when symptoms started:  none Symptoms improve with rest: no  Depressive symptoms: yes Stress/anxiety: yes Insomnia: no  Snoring: no Observed apnea by bed partner: no Daytime hypersomnolence:yes Wakes feeling refreshed: no History of sleep study: no Dysnea on exertion:  yes Orthopnea/PND: no Chest pain: no Chronic cough: no Lower extremity edema: no Arthralgias:yes Myalgias: no Weakness: yes Rash: no  Depression screen [facility] 2/9 12/20/2020 07/31/2019  Decreased Interest 1 0  Down, Depressed, Hopeless 1 0  PHQ - 2 Score 2 0  Altered sleeping 3 -  Tired, decreased energy 3 -  Change in appetite 0 -  Feeling bad or failure about yourself  0 -  Trouble concentrating 1 -  Moving slowly or fidgety/restless 0 -  Suicidal thoughts 0 -  PHQ-9 Score 9 -  Difficult doing work/chores Somewhat difficult -    SMOKING CESSATION Smoking Status: current some day smoker Smoking Amount:  2 a day Smoking Quit Date: not set Smoking triggers: stress Type of tobacco use: cigarettes Children in the house: no Other household members who smoke: no Treatments attempted: wellbutrin, gum, patch Pneumovax: UTD   Relevant past medical, surgical, family and social history reviewed and updated as indicated. Interim medical history since our  last visit reviewed. Allergies and medications reviewed and updated.  Review of Systems  Constitutional: Positive for fatigue. Negative for activity change, appetite change, chills, diaphoresis, fever and unexpected weight change.  Respiratory: Positive for shortness of breath. Negative for apnea, cough, choking, chest tightness, wheezing and stridor.   Cardiovascular: Negative.   Gastrointestinal: Negative.   Genitourinary: Negative.   Musculoskeletal: Negative.   Skin: Negative.   Neurological: Negative.   Psychiatric/Behavioral: Positive for dysphoric mood. Negative for agitation, behavioral problems, confusion, decreased concentration, hallucinations, self-injury, sleep disturbance and suicidal ideas. The patient is nervous/anxious. The patient is not hyperactive.     Per HPI unless specifically indicated above     Objective:    BP 104/65   Pulse 67   Temp 98.8 F (37.1 C)   Wt 185 lb 9.6 oz (84.2 kg)   LMP 05/27/2017 Comment: preg test neg  SpO2 99%   BMI 29.07 kg/m   Wt Readings from Last 3 Encounters:  03/03/21 185 lb 9.6 oz (84.2 kg)  11/01/20 175 lb (79.4 kg)  06/30/20 173 lb (78.5 kg)    Physical Exam Vitals and nursing note reviewed.  Constitutional:      General: She is not in acute distress.    Appearance: Normal appearance. She is not ill-appearing, toxic-appearing or diaphoretic.  HENT:     Head: Normocephalic and atraumatic.     Right Ear: External ear normal.  Left Ear: External ear normal.     Nose: Nose normal.     Mouth/Throat:     Mouth: Mucous membranes are moist.     Pharynx: Oropharynx is clear.  Eyes:     General: No scleral icterus.       Right eye: No discharge.        Left eye: No discharge.     Extraocular Movements: Extraocular movements intact.     Conjunctiva/sclera: Conjunctivae normal.     Pupils: Pupils are equal, round, and reactive to light.  Cardiovascular:     Rate and Rhythm: Normal rate and regular rhythm.     Pulses:  Normal pulses.     Heart sounds: Normal heart sounds. No murmur heard. No friction rub. No gallop.   Pulmonary:     Effort: Pulmonary effort is normal. No respiratory distress.     Breath sounds: Normal breath sounds. No stridor. No wheezing, rhonchi or rales.  Chest:     Chest wall: No tenderness.  Musculoskeletal:        General: Normal range of motion.     Cervical back: Normal range of motion and neck supple.  Skin:    General: Skin is warm and dry.     Capillary Refill: Capillary refill takes less than 2 seconds.     Coloration: Skin is not jaundiced or pale.     Findings: No bruising, erythema, lesion or rash.  Neurological:     General: No focal deficit present.     Mental Status: She is alert and oriented to person, place, and time. Mental status is at baseline.  Psychiatric:        Mood and Affect: Mood normal.        Behavior: Behavior normal.        Thought Content: Thought content normal.        Judgment: Judgment normal.     Results for orders placed or performed during the hospital encounter of 11/01/20  SARS CORONAVIRUS 2 (TAT 6-24 HRS) Nasopharyngeal Nasopharyngeal Swab   Specimen: Nasopharyngeal Swab  Result Value Ref Range   SARS Coronavirus 2 POSITIVE (A) NEGATIVE  POC SARS Coronavirus 2 Ag-ED - Nasal Swab (BD Veritor Kit)  Result Value Ref Range   SARS Coronavirus 2 Ag Negative Negative      Assessment & Plan:   Problem List Items Addressed This Visit      Other   Tobacco abuse    Down to 2 cigarettes some days! Doing great! Continue wellbutrin. Call with any concerns. Continue to monitor.        Other Visit Diagnoses    Chronic fatigue    -  Primary   Mood slightly better with wellbutrin. Fatigue same. Will check labs. Await results. May need sleep study. Recheck 1 month. Call with any concerns.       Follow up plan: Return in about 4 weeks (around 03/31/2021) for follow up fatigue.

## 2021-03-07 LAB — COMPREHENSIVE METABOLIC PANEL
ALT: 25 IU/L (ref 0–32)
AST: 18 IU/L (ref 0–40)
Albumin/Globulin Ratio: 1.7 (ref 1.2–2.2)
Albumin: 4 g/dL (ref 3.8–4.8)
Alkaline Phosphatase: 68 IU/L (ref 44–121)
BUN/Creatinine Ratio: 12 (ref 9–23)
BUN: 10 mg/dL (ref 6–24)
Bilirubin Total: 0.4 mg/dL (ref 0.0–1.2)
CO2: 20 mmol/L (ref 20–29)
Calcium: 8.8 mg/dL (ref 8.7–10.2)
Chloride: 107 mmol/L — ABNORMAL HIGH (ref 96–106)
Creatinine, Ser: 0.82 mg/dL (ref 0.57–1.00)
Globulin, Total: 2.3 g/dL (ref 1.5–4.5)
Glucose: 81 mg/dL (ref 65–99)
Potassium: 4 mmol/L (ref 3.5–5.2)
Sodium: 139 mmol/L (ref 134–144)
Total Protein: 6.3 g/dL (ref 6.0–8.5)
eGFR: 91 mL/min/{1.73_m2} (ref >59)

## 2021-03-07 LAB — CBC WITH DIFFERENTIAL/PLATELET
Basophils Absolute: 0 10*3/uL (ref 0.0–0.2)
Basos: 1 %
EOS (ABSOLUTE): 0.2 10*3/uL (ref 0.0–0.4)
Eos: 4 %
Hematocrit: 39.9 % (ref 34.0–46.6)
Hemoglobin: 13.4 g/dL (ref 11.1–15.9)
Immature Grans (Abs): 0 10*3/uL (ref 0.0–0.1)
Immature Granulocytes: 0 %
Lymphocytes Absolute: 2 10*3/uL (ref 0.7–3.1)
Lymphs: 31 %
MCH: 32.8 pg (ref 26.6–33.0)
MCHC: 33.6 g/dL (ref 31.5–35.7)
MCV: 98 fL — ABNORMAL HIGH (ref 79–97)
Monocytes Absolute: 0.5 10*3/uL (ref 0.1–0.9)
Monocytes: 8 %
Neutrophils Absolute: 3.6 10*3/uL (ref 1.4–7.0)
Neutrophils: 56 %
Platelets: 248 10*3/uL (ref 150–450)
RBC: 4.08 x10E6/uL (ref 3.77–5.28)
RDW: 12 % (ref 11.7–15.4)
WBC: 6.4 10*3/uL (ref 3.4–10.8)

## 2021-03-07 LAB — LYME DISEASE SEROLOGY W/REFLEX: Lyme Total Antibody EIA: NEGATIVE

## 2021-03-07 LAB — VITAMIN D 25 HYDROXY (VIT D DEFICIENCY, FRACTURES): Vit D, 25-Hydroxy: 22.4 ng/mL — ABNORMAL LOW (ref 30.0–100.0)

## 2021-03-07 LAB — EHRLICHIA ANTIBODY PANEL
E. Chaffeensis (HME) IgM Titer: NEGATIVE
E.Chaffeensis (HME) IgG: NEGATIVE
HGE IgG Titer: NEGATIVE
HGE IgM Titer: NEGATIVE

## 2021-03-07 LAB — ROCKY MTN SPOTTED FVR ABS PNL(IGG+IGM)
RMSF IgG: NEGATIVE
RMSF IgM: 1.6 index — ABNORMAL HIGH (ref 0.00–0.89)

## 2021-03-07 LAB — TSH: TSH: 1.31 u[IU]/mL (ref 0.450–4.500)

## 2021-03-07 LAB — BABESIA MICROTI ANTIBODY PANEL
Babesia microti IgG: <1:10 {titer}
Babesia microti IgM: <1:10 {titer}

## 2021-03-13 ENCOUNTER — Other Ambulatory Visit: Payer: Self-pay

## 2021-03-13 ENCOUNTER — Other Ambulatory Visit: Payer: Self-pay | Admitting: Family Medicine

## 2021-03-13 ENCOUNTER — Ambulatory Visit
Admission: RE | Admit: 2021-03-13 | Discharge: 2021-03-13 | Disposition: A | Discharge status: Home / Self Care | Payer: BC Managed Care – PPO | Source: Ambulatory Visit | Attending: Family Medicine | Admitting: Family Medicine

## 2021-03-13 DIAGNOSIS — N6310 Unspecified lump in the right breast, unspecified quadrant: Secondary | ICD-10-CM

## 2021-03-13 DIAGNOSIS — N6320 Unspecified lump in the left breast, unspecified quadrant: Secondary | ICD-10-CM

## 2021-03-14 ENCOUNTER — Encounter: Payer: Self-pay | Admitting: Family Medicine

## 2021-03-14 ENCOUNTER — Other Ambulatory Visit: Payer: Self-pay | Admitting: Family Medicine

## 2021-03-14 DIAGNOSIS — R928 Other abnormal and inconclusive findings on diagnostic imaging of breast: Secondary | ICD-10-CM

## 2021-03-14 DIAGNOSIS — N6310 Unspecified lump in the right breast, unspecified quadrant: Secondary | ICD-10-CM

## 2021-03-22 ENCOUNTER — Other Ambulatory Visit: Payer: Self-pay

## 2021-03-22 ENCOUNTER — Ambulatory Visit
Admission: RE | Admit: 2021-03-22 | Discharge: 2021-03-22 | Disposition: A | Discharge status: Home / Self Care | Payer: BC Managed Care – PPO | Source: Ambulatory Visit | Attending: Family Medicine | Admitting: Family Medicine

## 2021-03-22 DIAGNOSIS — R928 Other abnormal and inconclusive findings on diagnostic imaging of breast: Secondary | ICD-10-CM

## 2021-03-22 DIAGNOSIS — N6310 Unspecified lump in the right breast, unspecified quadrant: Secondary | ICD-10-CM | POA: Insufficient documentation

## 2021-03-22 HISTORY — PX: BREAST BIOPSY: SHX20

## 2021-03-23 ENCOUNTER — Telehealth: Payer: Self-pay

## 2021-03-23 LAB — SURGICAL PATHOLOGY

## 2021-03-23 NOTE — Telephone Encounter (Signed)
Called patient to gather more information. Left message for patient to give our office a call back at her earliest convenience.

## 2021-05-02 DIAGNOSIS — E559 Vitamin D deficiency, unspecified: Secondary | ICD-10-CM | POA: Insufficient documentation

## 2021-07-20 NOTE — Telephone Encounter (Signed)
This encounter was created in error - please disregard.

## 2021-12-21 ENCOUNTER — Other Ambulatory Visit: Payer: Self-pay

## 2021-12-21 ENCOUNTER — Ambulatory Visit: Payer: BC Managed Care – PPO | Admitting: Family Medicine

## 2021-12-21 ENCOUNTER — Encounter: Payer: Self-pay | Admitting: Family Medicine

## 2021-12-21 VITALS — BP 98/62 | HR 52 | Temp 98.5°F | Wt 194.4 lb

## 2021-12-21 DIAGNOSIS — Z23 Encounter for immunization: Secondary | ICD-10-CM | POA: Diagnosis not present

## 2021-12-21 DIAGNOSIS — R7301 Impaired fasting glucose: Secondary | ICD-10-CM | POA: Diagnosis not present

## 2021-12-21 DIAGNOSIS — Z0000 Encounter for general adult medical examination without abnormal findings: Secondary | ICD-10-CM | POA: Diagnosis not present

## 2021-12-21 DIAGNOSIS — Z9884 Bariatric surgery status: Secondary | ICD-10-CM | POA: Diagnosis not present

## 2021-12-21 DIAGNOSIS — F419 Anxiety disorder, unspecified: Secondary | ICD-10-CM

## 2021-12-21 DIAGNOSIS — E559 Vitamin D deficiency, unspecified: Secondary | ICD-10-CM | POA: Diagnosis not present

## 2021-12-21 DIAGNOSIS — Z021 Encounter for pre-employment examination: Secondary | ICD-10-CM

## 2021-12-21 DIAGNOSIS — Z111 Encounter for screening for respiratory tuberculosis: Secondary | ICD-10-CM

## 2021-12-21 DIAGNOSIS — Z1231 Encounter for screening mammogram for malignant neoplasm of breast: Secondary | ICD-10-CM

## 2021-12-21 DIAGNOSIS — F32A Depression, unspecified: Secondary | ICD-10-CM

## 2021-12-21 LAB — URINALYSIS, ROUTINE W REFLEX MICROSCOPIC
Bilirubin, UA: NEGATIVE
Glucose, UA: NEGATIVE
Ketones, UA: NEGATIVE
Leukocytes,UA: NEGATIVE
Nitrite, UA: NEGATIVE
Protein,UA: NEGATIVE
Specific Gravity, UA: 1.025 (ref 1.005–1.030)
Urobilinogen, Ur: 1 mg/dL (ref 0.2–1.0)
pH, UA: 5.5 (ref 5.0–7.5)

## 2021-12-21 LAB — MICROSCOPIC EXAMINATION
Bacteria, UA: NONE SEEN
Epithelial Cells (non renal): NONE SEEN /hpf (ref 0–10)
WBC, UA: NONE SEEN /hpf (ref 0–5)

## 2021-12-21 LAB — BAYER DCA HB A1C WAIVED: HB A1C (BAYER DCA - WAIVED): 5 % (ref 4.8–5.6)

## 2021-12-21 NOTE — Patient Instructions (Signed)
Please call to schedule your mammogram and/or bone density: °[facility]  °Address: [address], [place], [place] [address]  °Phone: [phone] ° °

## 2021-12-21 NOTE — Progress Notes (Signed)
° °BP 98/62    Pulse (!) 52    Temp 98.5 °F (36.9 °C)    Wt 194 lb 6.4 oz (88.2 kg)    LMP 05/27/2017 Comment: preg test neg   SpO2 99%    BMI 30.45 kg/m²   ° °Subjective:  ° ° Patient ID: Haley Garza, female    DOB: 02/20/1976, 45 y.o.   MRN: [id number] ° °HPI: °Haley Garza is a 45 y.o. female presenting on 12/21/2021 for comprehensive medical examination. Current medical complaints include: ° °DEPRESSION °Mood status: controlled °Satisfied with current treatment?: yes °Symptom severity: mild  °Duration of current treatment : chronic °Side effects: no °Medication compliance: excellent compliance °Psychotherapy/counseling: no  °Previous psychiatric medications: wellbutrin °Depressed mood: no °Anxious mood: no °Anhedonia: no °Significant weight loss or gain: no °Insomnia: no  °Fatigue: yes °Feelings of worthlessness or guilt: no °Impaired concentration/indecisiveness: no °Suicidal ideations: no °Hopelessness: no °Crying spells: no °Depression screen PHQ 2/9 12/21/2021 12/20/2020 07/31/2019  °Decreased Interest 1 1 0  °Down, Depressed, Hopeless 1 1 0  °PHQ - 2 Score 2 2 0  °Altered sleeping 1 3 -  °Tired, decreased energy 3 3 -  °Change in appetite 0 0 -  °Feeling bad or failure about yourself  0 0 -  °Trouble concentrating 0 1 -  °Moving slowly or fidgety/restless 0 0 -  °Suicidal thoughts 0 0 -  °PHQ-9 Score 6 9 -  °Difficult doing work/chores - Somewhat difficult -  ° °Menopausal Symptoms: yes ° °Depression Screen done today and results listed below:  °Depression screen PHQ 2/9 12/21/2021 12/20/2020 07/31/2019  °Decreased Interest 1 1 0  °Down, Depressed, Hopeless 1 1 0  °PHQ - 2 Score 2 2 0  °Altered sleeping 1 3 -  °Tired, decreased energy 3 3 -  °Change in appetite 0 0 -  °Feeling bad or failure about yourself  0 0 -  °Trouble concentrating 0 1 -  °Moving slowly or fidgety/restless 0 0 -  °Suicidal thoughts 0 0 -  °PHQ-9 Score 6 9 -  °Difficult doing work/chores - Somewhat difficult -  ° ° °Past Medical History:   °Past Medical History:  °Diagnosis Date  ° Anemia   ° Anxiety   ° Arthritis   ° Depression   ° History of kidney stones   ° IFG (impaired fasting glucose)   ° Obesity   ° PONV (postoperative nausea and vomiting)   ° Tobacco abuse   ° ° °Surgical History:  °Past Surgical History:  °Procedure Laterality Date  ° BARIATRIC SURGERY    ° BREAST BIOPSY Right 03/22/2021  ° us bx of mass, venus marker, path pending  ° CESAREAN SECTION    ° x 2   ° CYSTOSCOPY N/A 06/18/2017  ° Procedure: CYSTOSCOPY;  Surgeon: Staebler, Andreas, MD;  Location: [facility];  Service: Gynecology;  Laterality: N/A;  ° INTRAUTERINE DEVICE (IUD) INSERTION    ° feb 2014  ° KNEE ARTHROSCOPY Right   ° LAPAROSCOPIC HYSTERECTOMY N/A 06/18/2017  ° Procedure: HYSTERECTOMY TOTAL LAPAROSCOPIC;  Surgeon: Staebler, Andreas, MD;  Location: [facility];  Service: Gynecology;  Laterality: N/A;  ° TOTAL ABDOMINAL HYSTERECTOMY    ° TUBAL LIGATION    ° UTERINE FIBROID EMBOLIZATION  01/09/2017  ° Triangular vascular  ° ° °Medications:  °Current Outpatient Medications on File Prior to Visit  °Medication Sig  ° albuterol (VENTOLIN HFA) 108 (90 Base) MCG/ACT inhaler Inhale 2 puffs into the lungs every 6 (six) hours as needed for wheezing   wheezing or shortness of breath.   Ascorbic Acid (VITAMIN C) 1000 MG tablet Take 1,000 mg by mouth daily. Chewable   b complex vitamins tablet Take 1 tablet by mouth daily.   Biotin 5000 MCG CAPS Take 1 capsule by mouth daily.   buPROPion (WELLBUTRIN SR) 150 MG 12 hr tablet Take 1 tablet (150 mg total) by mouth 2 (two) times daily.   Cholecalciferol (VITAMIN D3) 5000 units CAPS Take 5,000 Units by mouth daily.   ferrous sulfate 325 (65 FE) MG tablet Take 65 mg by mouth daily with breakfast.   Multiple Vitamin (MULTIVITAMIN) tablet Take 1 tablet by mouth daily.   vitamin A 8000 UNIT capsule Take 8,000 Units by mouth daily.   vitamin E 200 UNIT capsule Take 200 Units by mouth daily.   VITAMIN K PO Take 100 mcg by mouth daily.   No current  facility-administered medications on file prior to visit.    Allergies:  Allergies  Allergen Reactions   Honey Bee Treatment [Bee Venom] Itching and Swelling   Oxycodone-Acetaminophen Hives and Itching    Social History:  Social History   Socioeconomic History   Marital status: Divorced    Spouse name: Not on file   Number of children: Not on file   Years of education: Not on file   Highest education level: Not on file  Occupational History   Not on file  Tobacco Use   Smoking status: Former    Packs/day: 0.00    Types: Cigarettes   Smokeless tobacco: Never   Tobacco comments:    a cigarrete every few days   Vaping Use   Vaping Use: Never used  Substance and Sexual Activity   Alcohol use: Yes    Comment: occ   Drug use: No   Sexual activity: Not Currently    Birth control/protection: None    Comment: Hysterectomy 2018  Other Topics Concern   Not on file  Social History Narrative   Not on file   Social Determinants of Health   Financial Resource Strain: Not on file  Food Insecurity: Not on file  Transportation Needs: Not on file  Physical Activity: Not on file  Stress: Not on file  Social Connections: Not on file  Intimate Partner Violence: Not on file   Social History   Tobacco Use  Smoking Status Former   Packs/day: 0.00   Types: Cigarettes  Smokeless Tobacco Never  Tobacco Comments   a cigarrete every few days    Social History   Substance and Sexual Activity  Alcohol Use Yes   Comment: occ    Family History:  Family History  Problem Relation Age of Onset   Hypertension Mother    Hyperlipidemia Mother    Depression Mother    Fibroids Mother    Breast cancer Neg Hx     Past medical history, surgical history, medications, allergies, family history and social history reviewed with patient today and changes made to appropriate areas of the chart.   Review of Systems  Constitutional:  Positive for diaphoresis and malaise/fatigue. Negative  for chills, fever and weight loss.  HENT: Negative.    Eyes:  Positive for blurred vision. Negative for double vision, photophobia, pain, discharge and redness.  Respiratory: Negative.    Cardiovascular: Negative.   Gastrointestinal: Negative.   Genitourinary: Negative.   Musculoskeletal: Negative.   Skin: Negative.   Neurological: Negative.   Endo/Heme/Allergies:  Negative for environmental allergies and polydipsia. Bruises/bleeds easily.  Psychiatric/Behavioral: Negative.  All other ROS negative except what is listed above and in the HPI.  ° °   °Objective:  °  °BP 98/62    Pulse (!) 52    Temp 98.5 °F (36.9 °C)    Wt 194 lb 6.4 oz (88.2 kg)    LMP 05/27/2017 Comment: preg test neg   SpO2 99%    BMI 30.45 kg/m²   °Wt Readings from Last 3 Encounters:  °12/21/21 194 lb 6.4 oz (88.2 kg)  °03/03/21 185 lb 9.6 oz (84.2 kg)  °11/01/20 175 lb (79.4 kg)  °  °Physical Exam °Vitals and nursing note reviewed.  °Constitutional:   °   General: She is not in acute distress. °   Appearance: Normal appearance. She is not ill-appearing, toxic-appearing or diaphoretic.  °HENT:  °   Head: Normocephalic and atraumatic.  °   Right Ear: Tympanic membrane, ear canal and external ear normal. There is no impacted cerumen.  °   Left Ear: Tympanic membrane, ear canal and external ear normal. There is no impacted cerumen.  °   Nose: Nose normal. No congestion or rhinorrhea.  °   Mouth/Throat:  °   Mouth: Mucous membranes are moist.  °   Pharynx: Oropharynx is clear. No oropharyngeal exudate or posterior oropharyngeal erythema.  °Eyes:  °   General: No scleral icterus.    °   Right eye: No discharge.     °   Left eye: No discharge.  °   Extraocular Movements: Extraocular movements intact.  °   Conjunctiva/sclera: Conjunctivae normal.  °   Pupils: Pupils are equal, round, and reactive to light.  °Neck:  °   Vascular: No carotid bruit.  °Cardiovascular:  °   Rate and Rhythm: Normal rate and regular rhythm.  °   Pulses: Normal  pulses.  °   Heart sounds: No murmur heard. °  No friction rub. No gallop.  °Pulmonary:  °   Effort: Pulmonary effort is normal. No respiratory distress.  °   Breath sounds: Normal breath sounds. No stridor. No wheezing, rhonchi or rales.  °Chest:  °   Chest wall: No tenderness.  °Abdominal:  °   General: Abdomen is flat. Bowel sounds are normal. There is no distension.  °   Palpations: Abdomen is soft. There is no mass.  °   Tenderness: There is no abdominal tenderness. There is no right CVA tenderness, left CVA tenderness, guarding or rebound.  °   Hernia: No hernia is present.  °Genitourinary: °   Comments: Breast and pelvic exams deferred with shared decision making °Musculoskeletal:     °   General: No swelling, tenderness, deformity or signs of injury.  °   Cervical back: Normal range of motion and neck supple. No rigidity. No muscular tenderness.  °   Right lower leg: No edema.  °   Left lower leg: No edema.  °Lymphadenopathy:  °   Cervical: No cervical adenopathy.  °Skin: °   General: Skin is warm and dry.  °   Capillary Refill: Capillary refill takes less than 2 seconds.  °   Coloration: Skin is not jaundiced or pale.  °   Findings: No bruising, erythema, lesion or rash.  °Neurological:  °   General: No focal deficit present.  °   Mental Status: She is alert and oriented to person, place, and time. Mental status is at baseline.  °   Cranial Nerves: No cranial nerve deficit.  °     Sensory: No sensory deficit.     Motor: No weakness.     Coordination: Coordination normal.     Gait: Gait normal.     Deep Tendon Reflexes: Reflexes normal.  Psychiatric:        Mood and Affect: Mood normal.        Behavior: Behavior normal.        Thought Content: Thought content normal.        Judgment: Judgment normal.    Results for orders placed or performed in visit on 12/21/21  Microscopic Examination   Urine  Result Value Ref Range   WBC, UA None seen 0 - 5 /hpf   RBC 0-2 0 - 2 /hpf   Epithelial Cells (non  renal) None seen 0 - 10 /hpf   Bacteria, UA None seen None seen/Few  CBC with Differential/Platelet  Result Value Ref Range   WBC 6.0 3.4 - 10.8 x10E3/uL   RBC 4.02 3.77 - 5.28 x10E6/uL   Hemoglobin 13.1 11.1 - 15.9 g/dL   Hematocrit 39.9 34.0 - 46.6 %   MCV 99 (H) 79 - 97 fL   MCH 32.6 26.6 - 33.0 pg   MCHC 32.8 31.5 - 35.7 g/dL   RDW 12.3 11.7 - 15.4 %   Platelets 275 150 - 450 x10E3/uL   Neutrophils 59 Not Estab. %   Lymphs 30 Not Estab. %   Monocytes 8 Not Estab. %   Eos 3 Not Estab. %   Basos 0 Not Estab. %   Neutrophils Absolute 3.5 1.4 - 7.0 x10E3/uL   Lymphocytes Absolute 1.8 0.7 - 3.1 x10E3/uL   Monocytes Absolute 0.5 0.1 - 0.9 x10E3/uL   EOS (ABSOLUTE) 0.2 0.0 - 0.4 x10E3/uL   Basophils Absolute 0.0 0.0 - 0.2 x10E3/uL   Immature Granulocytes 0 Not Estab. %   Immature Grans (Abs) 0.0 0.0 - 0.1 x10E3/uL  Comprehensive metabolic panel  Result Value Ref Range   Glucose 91 70 - 99 mg/dL   BUN 11 6 - 24 mg/dL   Creatinine, Ser 0.90 0.57 - 1.00 mg/dL   eGFR 81 >59 mL/min/1.73   BUN/Creatinine Ratio 12 9 - 23   Sodium 140 134 - 144 mmol/L   Potassium 4.4 3.5 - 5.2 mmol/L   Chloride 105 96 - 106 mmol/L   CO2 20 20 - 29 mmol/L   Calcium 8.9 8.7 - 10.2 mg/dL   Total Protein 6.1 6.0 - 8.5 g/dL   Albumin 4.0 3.8 - 4.8 g/dL   Globulin, Total 2.1 1.5 - 4.5 g/dL   Albumin/Globulin Ratio 1.9 1.2 - 2.2   Bilirubin Total 0.7 0.0 - 1.2 mg/dL   Alkaline Phosphatase 70 44 - 121 IU/L   AST 24 0 - 40 IU/L   ALT 35 (H) 0 - 32 IU/L  Lipid Panel w/o Chol/HDL Ratio  Result Value Ref Range   Cholesterol, Total 148 100 - 199 mg/dL   Triglycerides 39 0 - 149 mg/dL   HDL 67 >39 mg/dL   VLDL Cholesterol Cal 9 5 - 40 mg/dL   LDL Chol Calc (NIH) 72 0 - 99 mg/dL  Urinalysis, Routine w reflex microscopic  Result Value Ref Range   Specific Gravity, UA 1.025 1.005 - 1.030   pH, UA 5.5 5.0 - 7.5   Color, UA Yellow Yellow   Appearance Ur Clear Clear   Leukocytes,UA Negative Negative    Protein,UA Negative Negative/Trace   Glucose, UA Negative Negative   Ketones, UA Negative Negative  RBC, UA Trace (A) Negative  ° Bilirubin, UA Negative Negative  ° Urobilinogen, Ur 1.0 0.2 - 1.0 mg/dL  ° Nitrite, UA Negative Negative  ° Microscopic Examination See below:   °TSH  °Result Value Ref Range  ° TSH 1.300 0.450 - 4.500 uIU/mL  °Measles/Mumps/Rubella Immunity  °Result Value Ref Range  ° Rubella Antibodies, IGG 2.11 Immune >0.99 index  ° RUBEOLA AB, IGG 29.3 Immune >16.4 AU/mL  ° MUMPS ABS, IGG >300.0 Immune >10.9 AU/mL  °Varicella Zoster Abs, IgG/IgM  °Result Value Ref Range  ° Varicella zoster IgG 817 Immune >165 index  ° Varicella IgM WILL FOLLOW   °Hepatitis B surface antibody,quantitative  °Result Value Ref Range  ° Hepatitis B Surf Ab Quant <3.1 (L) Immunity>9.9 mIU/mL  °Bayer DCA Hb A1c Waived  °Result Value Ref Range  ° HB A1C (BAYER DCA - WAIVED) 5.0 4.8 - 5.6 %  °VITAMIN D 25 Hydroxy (Vit-D Deficiency, Fractures)  °Result Value Ref Range  ° Vit D, 25-Hydroxy 27.0 (L) 30.0 - 100.0 ng/mL  °B12  °Result Value Ref Range  ° Vitamin B-12 1,154 232 - 1,245 pg/mL  °Hepatitis C Antibody  °Result Value Ref Range  ° Hep C Virus Ab Non Reactive Non Reactive  ° °   °Assessment & Plan:  ° °Problem List Items Addressed This Visit   ° °  ° Endocrine  ° IFG (impaired fasting glucose)  °  Labs drawn today await results.  ° °  °  ° Relevant Orders  ° Bayer DCA Hb A1c Waived (Completed)  °  ° Other  ° Bariatric surgery status  °  Labs drawn today. Await results.  °  °  ° Relevant Orders  ° VITAMIN D 25 Hydroxy (Vit-D Deficiency, Fractures) (Completed)  ° B12 (Completed)  ° Anxiety and depression  °  Under good control on current regimen. Continue current regimen. Continue to monitor. Call with any concerns. Refills given. Labs drawn today. °  °  ° Vitamin D deficiency  °  Labs drawn today. Await results.  °  °  ° Relevant Orders  ° VITAMIN D 25 Hydroxy (Vit-D Deficiency, Fractures) (Completed)  ° °Other Visit  Diagnoses   ° ° Routine general medical examination at a health care facility    -  Primary  ° Vaccines up to date. Screening labs checked today. Pap N/A. Mammogram ordered. Continue diet and exercise. Call with any concerns.   ° Relevant Orders  ° CBC with Differential/Platelet (Completed)  ° Comprehensive metabolic panel (Completed)  ° Lipid Panel w/o Chol/HDL Ratio (Completed)  ° Urinalysis, Routine w reflex microscopic (Completed)  ° TSH (Completed)  ° Hepatitis C Antibody (Completed)  ° Screening for tuberculosis      ° Labs drawn today. Await results.  ° Relevant Orders  ° QuantiFERON-TB Gold Plus  ° Pre-employment examination      ° Labs drawn today. Await results.  ° Relevant Orders  ° Measles/Mumps/Rubella Immunity (Completed)  ° Varicella Zoster Abs, IgG/IgM (Completed)  ° Hepatitis B surface antibody,quantitative (Completed)  ° Encounter for screening mammogram for malignant neoplasm of breast      ° Mammogram ordered today.  ° Relevant Orders  ° MM 3D SCREEN BREAST BILATERAL  ° Need for influenza vaccination      ° flu shot given today.  ° Relevant Orders  ° Flu Vaccine QUAD 6+ mos PF IM (Fluarix Quad PF) (Completed)  ° °  °  ° °Follow up plan: °Return in about   6 months (around 06/20/2022). ° ° °LABORATORY TESTING:  °- Pap smear: not applicable ° °IMMUNIZATIONS:   °- Tdap: Tetanus vaccination status reviewed: last tetanus booster within 10 years. °- Influenza: Administered today °- Pneumovax: Not applicable °- Prevnar: Not applicable °- COVID: Up to date °- HPV: Not applicable °- Shingrix vaccine: Not applicable ° °SCREENING: °-Mammogram: Ordered today  ° °PATIENT COUNSELING:   °Advised to take 1 mg of folate supplement per day if capable of pregnancy.  ° °Sexuality: Discussed sexually transmitted diseases, partner selection, use of condoms, avoidance of unintended pregnancy  and contraceptive alternatives.  ° °Advised to avoid cigarette smoking. ° °I discussed with the patient that most people either  abstain from alcohol or drink within safe limits (<=14/week and <=4 drinks/occasion for males, <=7/weeks and <= 3 drinks/occasion for females) and that the risk for alcohol disorders and other health effects rises proportionally with the number of drinks per week and how often a drinker exceeds daily limits. ° °Discussed cessation/primary prevention of drug use and availability of treatment for abuse.  ° °Diet: Encouraged to adjust caloric intake to maintain  or achieve ideal body weight, to reduce intake of dietary saturated fat and total fat, to limit sodium intake by avoiding high sodium foods and not adding table salt, and to maintain adequate dietary potassium and calcium preferably from fresh fruits, vegetables, and low-fat dairy products.   ° °stressed the importance of regular exercise ° °Injury prevention: Discussed safety belts, safety helmets, smoke detector, smoking near bedding or upholstery.  ° °Dental health: Discussed importance of regular tooth brushing, flossing, and dental visits.  ° ° °NEXT PREVENTATIVE PHYSICAL DUE IN 1 YEAR. °Return in about 6 months (around 06/20/2022). ° ° ° ° ° ° ° ° ° ° °

## 2021-12-22 ENCOUNTER — Encounter: Payer: Self-pay | Admitting: Family Medicine

## 2021-12-22 ENCOUNTER — Other Ambulatory Visit: Payer: Self-pay | Admitting: Family Medicine

## 2021-12-22 DIAGNOSIS — Z23 Encounter for immunization: Secondary | ICD-10-CM

## 2021-12-22 NOTE — Assessment & Plan Note (Signed)
Under good control on current regimen. Continue current regimen. Continue to monitor. Call with any concerns. Refills given. Labs drawn today.   

## 2021-12-22 NOTE — Assessment & Plan Note (Signed)
Labs drawn today. Await results.  

## 2021-12-22 NOTE — Assessment & Plan Note (Signed)
Labs drawn today await results.

## 2021-12-23 LAB — CBC WITH DIFFERENTIAL/PLATELET
Basophils Absolute: 0 10*3/uL (ref 0.0–0.2)
Basos: 0 %
EOS (ABSOLUTE): 0.2 10*3/uL (ref 0.0–0.4)
Eos: 3 %
Hematocrit: 39.9 % (ref 34.0–46.6)
Hemoglobin: 13.1 g/dL (ref 11.1–15.9)
Immature Grans (Abs): 0 10*3/uL (ref 0.0–0.1)
Immature Granulocytes: 0 %
Lymphocytes Absolute: 1.8 10*3/uL (ref 0.7–3.1)
Lymphs: 30 %
MCH: 32.6 pg (ref 26.6–33.0)
MCHC: 32.8 g/dL (ref 31.5–35.7)
MCV: 99 fL — ABNORMAL HIGH (ref 79–97)
Monocytes Absolute: 0.5 10*3/uL (ref 0.1–0.9)
Monocytes: 8 %
Neutrophils Absolute: 3.5 10*3/uL (ref 1.4–7.0)
Neutrophils: 59 %
Platelets: 275 10*3/uL (ref 150–450)
RBC: 4.02 x10E6/uL (ref 3.77–5.28)
RDW: 12.3 % (ref 11.7–15.4)
WBC: 6 10*3/uL (ref 3.4–10.8)

## 2021-12-23 LAB — COMPREHENSIVE METABOLIC PANEL
ALT: 35 IU/L — ABNORMAL HIGH (ref 0–32)
AST: 24 IU/L (ref 0–40)
Albumin/Globulin Ratio: 1.9 (ref 1.2–2.2)
Albumin: 4 g/dL (ref 3.8–4.8)
Alkaline Phosphatase: 70 IU/L (ref 44–121)
BUN/Creatinine Ratio: 12 (ref 9–23)
BUN: 11 mg/dL (ref 6–24)
Bilirubin Total: 0.7 mg/dL (ref 0.0–1.2)
CO2: 20 mmol/L (ref 20–29)
Calcium: 8.9 mg/dL (ref 8.7–10.2)
Chloride: 105 mmol/L (ref 96–106)
Creatinine, Ser: 0.9 mg/dL (ref 0.57–1.00)
Globulin, Total: 2.1 g/dL (ref 1.5–4.5)
Glucose: 91 mg/dL (ref 70–99)
Potassium: 4.4 mmol/L (ref 3.5–5.2)
Sodium: 140 mmol/L (ref 134–144)
Total Protein: 6.1 g/dL (ref 6.0–8.5)
eGFR: 81 mL/min/{1.73_m2} (ref >59)

## 2021-12-23 LAB — VARICELLA ZOSTER ABS, IGG/IGM
Varicella IgM: <0.91 index (ref 0.00–0.90)
Varicella zoster IgG: 817 index (ref >165)

## 2021-12-23 LAB — LIPID PANEL W/O CHOL/HDL RATIO
Cholesterol, Total: 148 mg/dL (ref 100–199)
HDL: 67 mg/dL (ref >39)
LDL Chol Calc (NIH): 72 mg/dL (ref 0–99)
Triglycerides: 39 mg/dL (ref 0–149)
VLDL Cholesterol Cal: 9 mg/dL (ref 5–40)

## 2021-12-23 LAB — MEASLES/MUMPS/RUBELLA IMMUNITY
MUMPS ABS, IGG: >300 AU/mL (ref >10.9)
RUBEOLA AB, IGG: 29.3 AU/mL (ref >16.4)
Rubella Antibodies, IGG: 2.11 index (ref >0.99)

## 2021-12-23 LAB — HEPATITIS B SURFACE ANTIBODY, QUANTITATIVE: Hepatitis B Surf Ab Quant: <3.1 m[IU]/mL — ABNORMAL LOW (ref >9.9)

## 2021-12-23 LAB — VITAMIN D 25 HYDROXY (VIT D DEFICIENCY, FRACTURES): Vit D, 25-Hydroxy: 27 ng/mL — ABNORMAL LOW (ref 30.0–100.0)

## 2021-12-23 LAB — VITAMIN B12: Vitamin B-12: 1154 pg/mL (ref 232–1245)

## 2021-12-23 LAB — HEPATITIS C ANTIBODY: Hep C Virus Ab: NONREACTIVE

## 2021-12-23 LAB — TSH: TSH: 1.3 u[IU]/mL (ref 0.450–4.500)

## 2022-01-03 ENCOUNTER — Ambulatory Visit (INDEPENDENT_AMBULATORY_CARE_PROVIDER_SITE_OTHER): Payer: BC Managed Care – PPO

## 2022-01-03 ENCOUNTER — Other Ambulatory Visit: Payer: Self-pay

## 2022-01-03 DIAGNOSIS — Z23 Encounter for immunization: Secondary | ICD-10-CM

## 2022-01-15 ENCOUNTER — Other Ambulatory Visit: Payer: Self-pay

## 2022-01-16 ENCOUNTER — Ambulatory Visit (LOCAL_COMMUNITY_HEALTH_CENTER): Payer: Self-pay

## 2022-01-16 ENCOUNTER — Other Ambulatory Visit: Payer: Self-pay

## 2022-01-16 DIAGNOSIS — Z111 Encounter for screening for respiratory tuberculosis: Secondary | ICD-10-CM

## 2022-01-19 ENCOUNTER — Other Ambulatory Visit: Payer: Self-pay

## 2022-01-19 ENCOUNTER — Ambulatory Visit (LOCAL_COMMUNITY_HEALTH_CENTER): Payer: Self-pay

## 2022-01-19 DIAGNOSIS — Z111 Encounter for screening for respiratory tuberculosis: Secondary | ICD-10-CM

## 2022-01-19 LAB — TB SKIN TEST
Induration: 0 mm
TB Skin Test: NEGATIVE

## 2022-03-12 ENCOUNTER — Emergency Department
Admission: EM | Admit: 2022-03-12 | Discharge: 2022-03-12 | Disposition: A | Discharge status: Home / Self Care | Payer: BC Managed Care – PPO | Attending: Emergency Medicine | Admitting: Emergency Medicine

## 2022-03-12 ENCOUNTER — Emergency Department: Payer: BC Managed Care – PPO

## 2022-03-12 ENCOUNTER — Encounter: Payer: Self-pay | Admitting: Emergency Medicine

## 2022-03-12 ENCOUNTER — Other Ambulatory Visit: Payer: Self-pay

## 2022-03-12 DIAGNOSIS — W19XXXA Unspecified fall, initial encounter: Secondary | ICD-10-CM

## 2022-03-12 DIAGNOSIS — W07XXXA Fall from chair, initial encounter: Secondary | ICD-10-CM | POA: Insufficient documentation

## 2022-03-12 DIAGNOSIS — M542 Cervicalgia: Secondary | ICD-10-CM | POA: Diagnosis present

## 2022-03-12 DIAGNOSIS — R0789 Other chest pain: Secondary | ICD-10-CM | POA: Insufficient documentation

## 2022-03-12 DIAGNOSIS — Y990 Civilian activity done for income or pay: Secondary | ICD-10-CM | POA: Diagnosis not present

## 2022-03-12 MED ORDER — ONDANSETRON 4 MG PO TBDP
4.0000 mg | ORAL_TABLET | Freq: Once | ORAL | Status: AC
  Administered 2022-03-12: 4 mg via ORAL
  Filled 2022-03-12: qty 1

## 2022-03-12 MED ORDER — HYDROCODONE-ACETAMINOPHEN 5-325 MG PO TABS: 1.0000 | ORAL_TABLET | Freq: Four times a day (QID) | ORAL | 0 refills | Status: DC | PRN

## 2022-03-12 MED ORDER — HYDROCODONE-ACETAMINOPHEN 5-325 MG PO TABS
1.0000 | ORAL_TABLET | Freq: Once | ORAL | Status: AC
  Administered 2022-03-12: 1 via ORAL
  Filled 2022-03-12: qty 1

## 2022-03-12 MED ORDER — ONDANSETRON 4 MG PO TBDP: 4.0000 mg | ORAL_TABLET | Freq: Three times a day (TID) | ORAL | 0 refills | Status: AC | PRN

## 2022-03-12 MED ORDER — OXYCODONE-ACETAMINOPHEN 5-325 MG PO TABS
1.0000 | ORAL_TABLET | Freq: Once | ORAL | Status: DC
  Filled 2022-03-12: qty 1

## 2022-03-12 NOTE — ED Notes (Signed)
C collar removed per MD Jess[place] ? ?

## 2022-03-12 NOTE — ED Notes (Addendum)
See triage note  present s/p fall  states she caught her foot  having pain to hip and elbow   states she landed on her buttocks   states she is also having pain to lower back  and between shoulder blades   ?

## 2022-03-12 NOTE — ED Provider Notes (Signed)
? ?[facility] ?Provider Note ? ?Patient Contact: 6:27 PM (approximate) ? ? ?History  ? ?Fall ? ? ?HPI ? ?Haley Garza is a 46 y.o. female presents to the emergency department after a mechanical fall at work.  Patient is complaining of neck pain that radiates into the right upper extremity and loss of sensation in the right fifth digit.  She is also complaining of some reproducible anterior chest wall pain.  No shortness of breath.  Patient denies nausea, vomiting or abdominal pain.  No similar injuries in the past. ? ?  ? ? ?Physical Exam  ? ?Triage Vital Signs: ?ED Triage Vitals  ?Enc Vitals Group  ?   BP 03/12/22 1640 128/81  ?   Pulse Rate 03/12/22 1640 (!) 50  ?   Resp 03/12/22 1640 16  ?   Temp 03/12/22 1640 98.8 ?F (37.1 ?C)  ?   Temp Source 03/12/22 1640 Oral  ?   SpO2 03/12/22 1640 100 %  ?   Weight 03/12/22 1638 200 lb (90.7 kg)  ?   Height 03/12/22 1638 '5\' 7"'$  (1.702 m)  ?   Head Circumference --   ?   Peak Flow --   ?   Pain Score 03/12/22 1638 6  ?   Pain Loc --   ?   Pain Edu? --   ?   Excl. in [place]? --   ? ? ?Most recent vital signs: ?Vitals:  ? 03/12/22 1640 03/12/22 1910  ?BP: 128/81 116/74  ?Pulse: (!) 50 (!) 51  ?Resp: 16 18  ?Temp: 98.8 ?F (37.1 ?C)   ?SpO2: 100% 100%  ? ? ? ?General: Alert and in no acute distress. ?Eyes:  PERRL. EOMI. ?Head: No acute traumatic findings ?ENT: ?     Nose: No congestion/rhinnorhea. ?     Mouth/Throat: Mucous membranes are moist.  ?Neck: No stridor. No cervical spine tenderness to palpation.  Patient has midline C-spine tenderness to palpation. ?Cardiovascular:  Good peripheral perfusion ?Respiratory: Normal respiratory effort without tachypnea or retractions. Lungs CTAB. Good air entry to the bases with no decreased or absent breath sounds. ?Gastrointestinal: Bowel sounds ?4 quadrants. Soft and nontender to palpation. No guarding or rigidity. No palpable masses. No distention. No CVA tenderness. ?Musculoskeletal: Patient has symmetric  strength in the upper and lower extremities.  Full range of motion to all extremities.  ?Neurologic:  No gross focal neurologic deficits are appreciated.  ?Skin:   No rash noted ? ? ?ED Results / Procedures / Treatments  ? ?Labs ?(all labs ordered are listed, but only abnormal results are displayed) ?Labs Reviewed - No data to display ? ? ? ?RADIOLOGY ? ?I personally viewed and evaluated these images as part of my medical decision making, as well as reviewing the written report by the radiologist. ? ?ED Provider Interpretation: I personally interpreted x-rays of the sacrum, lumbar spine, right elbow and agree with radiologist interpretation.  No acute bony abnormality.  No evidence of pneumothorax or rib fracture on chest x-ray.  No acute bony abnormality of the cervical spine. ? ? ?PROCEDURES: ? ?Critical Care performed: No ? ?Procedures ? ? ?MEDICATIONS ORDERED IN ED: ?Medications  ?ondansetron (ZOFRAN-ODT) disintegrating tablet 4 mg (4 mg Oral Given 03/12/22 1832)  ?HYDROcodone-acetaminophen (NORCO/VICODIN) 5-325 MG per tablet 1 tablet (1 tablet Oral Given 03/12/22 1906)  ? ? ? ?IMPRESSION / MDM / ASSESSMENT AND PLAN / ED COURSE  ?I reviewed the triage vital signs and the nursing notes. ?             ?               ?  Assessment and plan: ?Fall:  ?Differential diagnosis includes, but is not limited to, cervical spine fracture, cervical radiculopathy, rib fracture, pneumothorax, lumbar spine fracture ? ?46 year old female presents to the emergency department after mechanical fall.  X-rays and CT of the cervical spine unremarkable.  Patient was discharged with a short course of Norco for pain and was advised to follow-up with primary care as needed. ? ? ?FINAL CLINICAL IMPRESSION(S) / ED DIAGNOSES  ? ?Final diagnoses:  ?Fall, initial encounter  ? ? ? ?Rx / DC Orders  ? ?ED Discharge Orders   ? ?      Ordered  ?  HYDROcodone-acetaminophen (NORCO) 5-325 MG tablet  Every 6 hours PRN       ? 03/12/22 2001  ?  ondansetron  (ZOFRAN-ODT) 4 MG disintegrating tablet  Every 8 hours PRN       ? 03/12/22 2001  ? ?  ?  ? ?  ? ? ? ?Note:  This document was prepared using Dragon voice recognition software and may include unintentional dictation errors. ?  ?Lannie Fields, PA-C ?03/12/22 2012 ? ?  ?Blake Divine, MD ?03/12/22 2334 ? ?

## 2022-03-12 NOTE — ED Triage Notes (Signed)
Pt to ED via POV stating that she was at work and was stepping out of a chair, pt states that when she stepped down, one of her legs did not make it and she fell, landing on her buttocks. Pt reports that she hit her right elbow on the table. Pt states that as the day has went on she started having pain in her right arm that goes up into her neck and jaw. Pt is also having numbness in her ring and pinky finger on the right hand. Pt is also stating that she is having bilateral leg pain. Pt did not hit her head.  ?

## 2022-03-15 ENCOUNTER — Ambulatory Visit: Payer: BC Managed Care – PPO | Admitting: Family Medicine

## 2022-03-15 ENCOUNTER — Encounter: Payer: Self-pay | Admitting: Family Medicine

## 2022-03-15 VITALS — BP 93/60 | HR 50 | Temp 98.0°F | Wt 200.2 lb

## 2022-03-15 DIAGNOSIS — R52 Pain, unspecified: Secondary | ICD-10-CM

## 2022-03-15 MED ORDER — NAPROXEN 500 MG PO TABS: 500.0000 mg | ORAL_TABLET | Freq: Two times a day (BID) | ORAL | 0 refills | Status: DC

## 2022-03-15 MED ORDER — KETOROLAC TROMETHAMINE 60 MG/2ML IM SOLN
60.0000 mg | Freq: Once | INTRAMUSCULAR | Status: AC
  Administered 2022-03-15: 60 mg via INTRAMUSCULAR

## 2022-03-15 MED ORDER — METAXALONE 800 MG PO TABS: 800.0000 mg | ORAL_TABLET | Freq: Three times a day (TID) | ORAL | 1 refills | Status: DC

## 2022-03-15 MED ORDER — HYDROCODONE-ACETAMINOPHEN 5-325 MG PO TABS: 1.0000 | ORAL_TABLET | ORAL | 0 refills | Status: AC | PRN

## 2022-03-15 NOTE — Progress Notes (Signed)
BP 93/60   Pulse (!) 50   Temp 98 F (36.7 C)   Wt 200 lb 3.2 oz (90.8 kg)   LMP 05/27/2017 Comment: preg test neg  SpO2 96%   BMI 31.36 kg/m    Subjective:    Patient ID: Haley Garza, female    DOB: 1976-03-30, 46 y.o.   MRN: [id number]  HPI: Haley Garza is a 46 y.o. female  Chief Complaint  Patient presents with   Fall    Patient states she fell on Monday, she was standing on a chair and fell off. Patient is still having back pain, and feels spasms in her legs and arms. Patient states she was cleared to go back to work with restrictions but doesn't feel like she is capable of doing her job.    Fell Monday. She was standing on a chair at work and fell off onto cement floor. She went to the ER and had imaging which was negative for fracture. She has not been feeling well. She has been having pain in her back and neck and shoulders. She feels OK when she is on her medicine, but feels really bad when she's off it. Pain is aching and sore. Better with medicine, worse with activity. No other concerns or complaints at this time.   Relevant past medical, surgical, family and social history reviewed and updated as indicated. Interim medical history since our last visit reviewed. Allergies and medications reviewed and updated.  Review of Systems  Constitutional: Negative.   Respiratory: Negative.    Cardiovascular: Negative.   Gastrointestinal: Negative.   Musculoskeletal: Negative.   Neurological: Negative.   Psychiatric/Behavioral: Negative.     Per HPI unless specifically indicated above     Objective:    BP 93/60   Pulse (!) 50   Temp 98 F (36.7 C)   Wt 200 lb 3.2 oz (90.8 kg)   LMP 05/27/2017 Comment: preg test neg  SpO2 96%   BMI 31.36 kg/m   Wt Readings from Last 3 Encounters:  03/15/22 200 lb 3.2 oz (90.8 kg)  03/12/22 200 lb (90.7 kg)  12/21/21 194 lb 6.4 oz (88.2 kg)    Physical Exam Vitals and nursing note reviewed.  Constitutional:       General: She is in acute distress.     Appearance: Normal appearance. She is not ill-appearing, toxic-appearing or diaphoretic.  HENT:     Head: Normocephalic and atraumatic.     Right Ear: External ear normal.     Left Ear: External ear normal.     Nose: Nose normal.     Mouth/Throat:     Mouth: Mucous membranes are moist.     Pharynx: Oropharynx is clear.  Eyes:     General: No scleral icterus.       Right eye: No discharge.        Left eye: No discharge.     Extraocular Movements: Extraocular movements intact.     Conjunctiva/sclera: Conjunctivae normal.     Pupils: Pupils are equal, round, and reactive to light.  Cardiovascular:     Rate and Rhythm: Normal rate and regular rhythm.     Pulses: Normal pulses.     Heart sounds: Normal heart sounds. No murmur heard.   No friction rub. No gallop.  Pulmonary:     Effort: Pulmonary effort is normal. No respiratory distress.     Breath sounds: Normal breath sounds. No stridor. No wheezing, rhonchi or rales.  Chest:  Chest wall: No tenderness.  Musculoskeletal:        General: Tenderness present. No swelling, deformity or signs of injury.     Cervical back: Normal range of motion and neck supple.     Right lower leg: No edema.     Left lower leg: No edema.     Comments: Upper back and lumbar paraspinal muscle spasms bilaterally  Skin:    General: Skin is warm and dry.     Capillary Refill: Capillary refill takes less than 2 seconds.     Coloration: Skin is not jaundiced or pale.     Findings: No bruising, erythema, lesion or rash.  Neurological:     General: No focal deficit present.     Mental Status: She is alert and oriented to person, place, and time. Mental status is at baseline.  Psychiatric:        Mood and Affect: Mood normal.        Behavior: Behavior normal.        Thought Content: Thought content normal.        Judgment: Judgment normal.    Results for orders placed or performed in visit on 01/16/22  TB Skin  Test  Result Value Ref Range   TB Skin Test Negative    Induration 0 mm      Assessment & Plan:   Problem List Items Addressed This Visit   None Visit Diagnoses     Whole body pain    -  Primary   Likely whiplash from the fall. Will treat with naproxen tomorrow, toradol today, skelaxin and hydrocodone. Out of work for the next week. Call with any concerns   Relevant Medications   ketorolac (TORADOL) injection 60 mg (Completed)        Follow up plan: Return if symptoms worsen or fail to improve.

## 2022-03-19 ENCOUNTER — Telehealth: Payer: Self-pay

## 2022-03-19 NOTE — Telephone Encounter (Signed)
PA initiated for Hydrocodone-Acetaminophen 5-325 MG tablets  Key: [id number]  Waiting on determination.

## 2022-03-20 NOTE — Telephone Encounter (Signed)
PA approved for Hydrocodone-Acetaminophen 5-'325MG'$  via cover my meds Will notify patient via [place].

## 2022-06-25 ENCOUNTER — Ambulatory Visit: Payer: BC Managed Care – PPO | Admitting: Family Medicine

## 2022-06-25 DIAGNOSIS — R7301 Impaired fasting glucose: Secondary | ICD-10-CM

## 2022-06-25 DIAGNOSIS — E559 Vitamin D deficiency, unspecified: Secondary | ICD-10-CM

## 2022-08-27 ENCOUNTER — Ambulatory Visit
Admission: EM | Admit: 2022-08-27 | Discharge: 2022-08-27 | Disposition: A | Discharge status: Home / Self Care | Payer: BC Managed Care – PPO | Attending: Family Medicine | Admitting: Family Medicine

## 2022-08-27 ENCOUNTER — Telehealth: Payer: Self-pay | Admitting: Family Medicine

## 2022-08-27 ENCOUNTER — Ambulatory Visit: Admit: 2022-08-27 | Discharge status: Absent without leave | Payer: BC Managed Care – PPO

## 2022-08-27 ENCOUNTER — Encounter: Payer: Self-pay | Admitting: Family Medicine

## 2022-08-27 DIAGNOSIS — L03032 Cellulitis of left toe: Secondary | ICD-10-CM

## 2022-08-27 MED ORDER — DOXYCYCLINE HYCLATE 100 MG PO CAPS: 100.0000 mg | ORAL_CAPSULE | Freq: Two times a day (BID) | ORAL | 0 refills | Status: AC

## 2022-08-27 NOTE — Discharge Instructions (Addendum)
Advised patient to take medication as directed with food to completion.  Encouraged patient increase daily water intake while taking this medication.  Advised patient to follow-up with [facility] podiatrist for further evaluation.  Contact information is below.

## 2022-08-27 NOTE — ED Provider Notes (Signed)
[facility]    CSN: [id number] Arrival date & time: 08/27/22  1627      History   Chief Complaint Chief Complaint  Patient presents with   Toe Pain    HPI Haley Garza is a 46 y.o. female.   HPI Pleasant 46 year old female presents with left first toe pain after cutting toenail back last week.  Reports now entire footing hurting and using OTC antibiotics.  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   Bariatric surgery status 09/14/2015   Asthma 09/14/2015   Anxiety and depression 09/14/2015   Tobacco abuse 09/14/2015   IFG (impaired fasting glucose) 09/14/2015    Past Surgical History:  Procedure Laterality Date   BARIATRIC SURGERY     BREAST BIOPSY Right 03/22/2021   [place] bx of mass, venus marker, path pending   CESAREAN SECTION     x 2    CYSTOSCOPY N/A 06/18/2017   Procedure: CYSTOSCOPY;  Surgeon: [name], MD;  Location: [facility] ORS;  Service: Gynecology;  Laterality: N/A;   INTRAUTERINE DEVICE (IUD) INSERTION     feb 2014   KNEE ARTHROSCOPY Right    LAPAROSCOPIC HYSTERECTOMY N/A 06/18/2017   Procedure: HYSTERECTOMY TOTAL LAPAROSCOPIC;  Surgeon: [name], MD;  Location: [facility] ORS;  Service: Gynecology;  Laterality: N/A;   TOTAL ABDOMINAL HYSTERECTOMY     TUBAL LIGATION     UTERINE FIBROID EMBOLIZATION  01/09/2017   Triangular vascular    OB History     Gravida  3   Para  3   Term  3   Preterm      AB      Living  3      SAB      IAB      Ectopic      Multiple      Live Births  3             Home Medications    Prior to Admission medications   Medication Sig Start Date End Date Taking? Authorizing Provider  doxycycline (VIBRAMYCIN) 100 MG capsule Take 1 capsule (100 mg total) by mouth 2 (two) times daily for 10 days. 08/27/22 09/06/22 Yes Eliezer Lofts, FNP  albuterol (VENTOLIN HFA) 108 (90 Base) MCG/ACT inhaler Inhale 2 puffs into the lungs every 6 (six) hours as needed for wheezing or shortness of breath. 11/01/20   Marlana Salvage, PA  Ascorbic Acid (VITAMIN C) 1000 MG tablet Take 1,000 mg by mouth daily. Chewable    [provider]  b complex vitamins tablet Take 1 tablet by mouth daily.    [provider]  Biotin 5000 MCG CAPS Take 1 capsule by mouth daily.    [provider]  buPROPion (WELLBUTRIN SR) 150 MG 12 hr tablet Take 1 tablet (150 mg total) by mouth 2 (two) times daily. Patient not taking: Reported on 03/15/2022 03/03/21   Park Liter P, DO  Cholecalciferol (VITAMIN D3) 5000 units CAPS Take 5,000 Units by mouth daily.  [provider]  ferrous sulfate 325 (65 FE) MG tablet Take 65 mg by mouth daily with breakfast.    [provider]  metaxalone (SKELAXIN) 800 MG tablet Take 1 tablet (800 mg total) by mouth 3 (three) times daily. 03/15/22   Park Liter P, DO  Multiple Vitamin (MULTIVITAMIN) tablet Take 1 tablet by mouth daily.    [provider]  naproxen (NAPROSYN) 500 MG tablet Take 1 tablet (500 mg total) by mouth 2 (two) times daily with a meal. 03/15/22   Johnson, Megan P, DO  predniSONE (DELTASONE) 10 MG tablet Take 10 mg by mouth 2 (two) times daily. 03/14/22   [provider]  vitamin A 8000 UNIT capsule Take 8,000 Units by mouth daily.    [provider]  vitamin E 200 UNIT capsule Take 200 Units by mouth daily.    [provider]  VITAMIN K PO Take 100 mcg by mouth daily.    [provider]    Family History Family History  Problem Relation Age of Onset    Hypertension Mother    Hyperlipidemia Mother    Depression Mother    Fibroids Mother    Breast cancer Neg Hx     Social History Social History   Tobacco Use   Smoking status: Former    Packs/day: 0.00    Types: Cigarettes   Smokeless tobacco: Never   Tobacco comments:    a cigarrete every few days   Vaping Use   Vaping Use: Never used  Substance Use Topics   Alcohol use: Yes    Comment: occ   Drug use: No     Allergies   Honey bee treatment [bee venom] and Oxycodone-acetaminophen   Review of Systems Review of Systems  Musculoskeletal:        Great toe pain x1 week secondary to cutting nail to close per patient     Physical Exam Triage Vital Signs ED Triage Vitals  Enc Vitals Group     BP      Pulse      Resp      Temp      Temp src      SpO2      Weight      Height      Head Circumference      Peak Flow      Pain Score      Pain Loc      Pain Edu?      Excl. in [place]?    No data found.  Updated Vital Signs BP 105/70 (BP Location: Right Arm)   Pulse (!) 58 Comment: pt states this is her normal  Temp 98.3 F (36.8 C) (Oral)   Resp 16   LMP 05/27/2017 Comment: preg test neg  SpO2 99%   Physical Exam Vitals and nursing note reviewed.  Constitutional:      Appearance: Normal appearance. She is normal weight.  HENT:     Head: Normocephalic and atraumatic.     Mouth/Throat:     Mouth: Mucous membranes are moist.     Pharynx: Oropharynx is clear.  Eyes:     Extraocular Movements: Extraocular movements intact.     Conjunctiva/sclera: Conjunctivae normal.     Pupils: Pupils are equal, round, and reactive to light.  Cardiovascular:     Rate and Rhythm: Normal rate and regular rhythm.     Pulses: Normal pulses.     Heart sounds: Normal heart sounds.  Pulmonary:  Effort: Pulmonary effort is normal.     Breath sounds: Normal breath sounds. No wheezing, rhonchi or rales.  Musculoskeletal:     Cervical back: Normal range of motion and neck  supple.     Comments: Left great toe:  Skin:    General: Skin is warm and dry.     Comments: Left great toe (dorsum): Mildly erythematous at nail plate border-see image inserted below  Neurological:     General: No focal deficit present.     Mental Status: She is alert and oriented to person, place, and time.       UC Treatments / Results  Labs (all labs ordered are listed, but only abnormal results are displayed) Labs Reviewed - No data to display  EKG   Radiology No results found.  Procedures Procedures (including critical care time)  Medications Ordered in UC Medications - No data to display  Initial Impression / Assessment and Plan / UC Course  I have reviewed the triage vital signs and the nursing notes.  Pertinent labs & imaging results that were available during my care of the patient were reviewed by me and considered in my medical decision making (see chart for details).     MDM: 1.  Cellulitis of great toe, left-Rx'd Doxycycline. Advised patient to take medication as directed with food to completion.  Encouraged patient increase daily water intake while taking this medication.  Advised patient to follow-up with [facility] podiatrist for further evaluation.  Contact information is below.  Patient discharged home, hemodynamically stable. Final Clinical Impressions(s) / UC Diagnoses   Final diagnoses:  Cellulitis of great toe, left     Discharge Instructions      Advised patient to take medication as directed with food to completion.  Encouraged patient increase daily water intake while taking this medication.  Advised patient to follow-up with [facility] podiatrist for further evaluation.  Contact information is below.     ED Prescriptions     Medication Sig Dispense Auth. Provider   doxycycline (VIBRAMYCIN) 100 MG capsule Take 1 capsule (100 mg total) by mouth 2 (two) times daily for 10 days. 20 capsule Eliezer Lofts, FNP      PDMP not reviewed  this encounter.   Eliezer Lofts, [place] 08/27/22 1754

## 2022-08-27 NOTE — ED Triage Notes (Signed)
Pt c/o left first toe pain after trying to cut her toenail back last week. States now her entire foot is hurting. She has been using abx ointment.

## 2022-08-27 NOTE — Telephone Encounter (Signed)
Advised patient medication has been sent to new pharmacy per request.

## 2022-08-31 ENCOUNTER — Ambulatory Visit: Payer: BC Managed Care – PPO | Admitting: Podiatry

## 2022-08-31 ENCOUNTER — Ambulatory Visit: Payer: BC Managed Care – PPO

## 2022-08-31 DIAGNOSIS — M79672 Pain in left foot: Secondary | ICD-10-CM

## 2022-08-31 DIAGNOSIS — L600 Ingrowing nail: Secondary | ICD-10-CM | POA: Diagnosis not present

## 2022-08-31 NOTE — Patient Instructions (Signed)
Finish course of antibiotics  Soak Instructions    THE DAY AFTER THE PROCEDURE  Place 1/4 cup of epsom salts in a quart of warm tap water.  Submerge your foot or feet with outer bandage intact for the initial soak; this will allow the bandage to become moist and wet for easy lift off.  Once you remove your bandage, continue to soak in the solution for 20 minutes.  This soak should be done twice a day.  Next, remove your foot or feet from solution, blot dry the affected area and cover.  You may use a band aid large enough to cover the area or use gauze and tape.  Apply other medications to the area as directed by the doctor such as polysporin neosporin.  IF YOUR SKIN BECOMES IRRITATED WHILE USING THESE INSTRUCTIONS, IT IS OKAY TO SWITCH TO  WHITE VINEGAR AND WATER. Or you may use antibacterial soap and water to keep the toe clean  Monitor for any signs/symptoms of infection. Call the office immediately if any occur or go directly to the emergency room. Call with any questions/concerns.

## 2022-09-02 NOTE — Progress Notes (Signed)
Subjective:   Patient ID: Haley Garza, female   DOB: 46 y.o.   MRN: [id number]   HPI Chief Complaint  Patient presents with   Ingrown Toenail    Ingrown toe nail left hallux, started 2 weeks ago, sore and swollen,Rate of pain 1 out of 10,  seen by [facility] Monday, [place]: Doxycyline    46 year old female presents the office today with concerns of ingrown toenail left big toe appears about 2 weeks ago there is been sore.  She went to urgent care she was prescribed doxycycline for which she is still taking.  Currently denies drainage or pus.  There is tender with pressure.  No other concerns.   Review of Systems  All other systems reviewed and are negative.  Past Medical History:  Diagnosis Date   Anemia    Anxiety    Arthritis    Depression    History of kidney stones    IFG (impaired fasting glucose)    Obesity    PONV (postoperative nausea and vomiting)    Tobacco abuse     Past Surgical History:  Procedure Laterality Date   BARIATRIC SURGERY     BREAST BIOPSY Right 03/22/2021   [place] bx of mass, venus marker, path pending   CESAREAN SECTION     x 2    CYSTOSCOPY N/A 06/18/2017   Procedure: CYSTOSCOPY;  Surgeon: [name], MD;  Location: [facility] ORS;  Service: Gynecology;  Laterality: N/A;   INTRAUTERINE DEVICE (IUD) INSERTION     feb 2014   KNEE ARTHROSCOPY Right    LAPAROSCOPIC HYSTERECTOMY N/A 06/18/2017   Procedure: HYSTERECTOMY TOTAL LAPAROSCOPIC;  Surgeon: [name], MD;  Location: [facility] ORS;  Service: Gynecology;  Laterality: N/A;   TOTAL ABDOMINAL HYSTERECTOMY     TUBAL LIGATION     UTERINE FIBROID EMBOLIZATION  01/09/2017   Triangular vascular     Current Outpatient Medications:    albuterol (VENTOLIN HFA) 108 (90 Base) MCG/ACT inhaler, Inhale 2 puffs into the lungs every 6 (six) hours as needed for wheezing or shortness of breath., Disp: 8 g, Rfl: 0   Ascorbic Acid (VITAMIN C) 1000 MG tablet, Take 1,000 mg by mouth daily. Chewable, Disp: ,  Rfl:    b complex vitamins tablet, Take 1 tablet by mouth daily., Disp: , Rfl:    Biotin 5000 MCG CAPS, Take 1 capsule by mouth daily., Disp: , Rfl:    buPROPion (WELLBUTRIN SR) 150 MG 12 hr tablet, Take 1 tablet (150 mg total) by mouth 2 (two) times daily. (Patient not taking: Reported on 03/15/2022), Disp: 180 tablet, Rfl: 1   Cholecalciferol (VITAMIN D3) 5000 units CAPS, Take 5,000 Units by mouth daily., Disp: , Rfl:    doxycycline (VIBRAMYCIN) 100 MG capsule, Take 1 capsule (100 mg total) by mouth 2 (two) times daily for 10 days., Disp: 20 capsule, Rfl: 0   doxycycline (VIBRAMYCIN) 100 MG capsule, Take 1 capsule (100 mg total) by mouth 2 (two) times daily for 10 days., Disp: 20 capsule, Rfl: 0   ferrous sulfate 325 (65 FE) MG tablet, Take 65 mg by mouth daily with breakfast., Disp: , Rfl:    metaxalone (SKELAXIN) 800 MG tablet, Take 1 tablet (800 mg total) by mouth 3 (three) times daily., Disp: 180 tablet, Rfl: 1   Multiple Vitamin (MULTIVITAMIN) tablet, Take 1 tablet by mouth daily., Disp: , Rfl:    naproxen (NAPROSYN) 500 MG tablet, Take 1 tablet (500 mg total) by mouth 2 (two) times daily with  a meal., Disp: 60 tablet, Rfl: 0   predniSONE (DELTASONE) 10 MG tablet, Take 10 mg by mouth 2 (two) times daily., Disp: , Rfl:    vitamin A 8000 UNIT capsule, Take 8,000 Units by mouth daily., Disp: , Rfl:    vitamin E 200 UNIT capsule, Take 200 Units by mouth daily., Disp: , Rfl:    VITAMIN K PO, Take 100 mcg by mouth daily., Disp: , Rfl:   Allergies  Allergen Reactions   Honey Bee Treatment [Bee Venom] Itching and Swelling   Oxycodone-Acetaminophen Hives and Itching        Objective:  Physical Exam  General: AAO x3, NAD  Dermatological: Incurvation present along the left hallux toenail with tenderness palpation.  There is localized edema but there is no drainage or pus or ascending cellulitis.  There is no open lesions.  Vascular: Dorsalis Pedis artery and Posterior Tibial artery pedal  pulses are 2/4 bilateral with immedate capillary fill time.  There is no pain with calf compression, swelling, warmth, erythema.   Neruologic: Grossly intact via light touch bilateral.   Musculoskeletal: Tenderness of ingrown toenail.  No other areas of discomfort.  Gait: Unassisted, Nonantalgic.        Assessment:   Ingrown toenail left hallux     Plan:  -Treatment options discussed including all alternatives, risks, and complications -Etiology of symptoms were discussed -At this time, the patient is requesting partial nail removal with chemical matricectomy to the symptomatic portion of the nail. Risks and complications were discussed with the patient for which they understand and written consent was obtained. Under sterile conditions a total of 3 mL of a mixture of 2% lidocaine plain and 0.5% Marcaine plain was infiltrated in a hallux block fashion. Once anesthetized, the skin was prepped in sterile fashion. A tourniquet was then applied. Next the symptomatic borders of the hallux nail border was then sharply excised making sure to remove the entire offending nail border. Once the nails were ensured to be removed area was debrided and the underlying skin was intact. There is no purulence identified in the procedure. Next phenol was then applied under standard conditions and copiously irrigated. Silvadene was applied. A dry sterile dressing was applied. After application of the dressing the tourniquet was removed and there is found to be an immediate capillary refill time to the digit. The patient tolerated the procedure well any complications. Post procedure instructions were discussed the patient for which he verbally understood. Discussed signs/symptoms of infection and directed to call the office immediately should any occur or go directly to the emergency room. In the meantime, encouraged to call the office with any questions, concerns, changes symptoms. -Finish course of  doxycycline  Trula Slade DPM

## 2022-09-14 ENCOUNTER — Encounter: Payer: Self-pay | Admitting: Podiatry

## 2022-09-17 ENCOUNTER — Other Ambulatory Visit: Payer: Self-pay | Admitting: Podiatry

## 2022-09-17 DIAGNOSIS — Z79899 Other long term (current) drug therapy: Secondary | ICD-10-CM

## 2022-09-25 ENCOUNTER — Ambulatory Visit
Admission: EM | Admit: 2022-09-25 | Discharge: 2022-09-25 | Disposition: A | Discharge status: Home / Self Care | Payer: BC Managed Care – PPO | Attending: Emergency Medicine | Admitting: Emergency Medicine

## 2022-09-25 ENCOUNTER — Ambulatory Visit (INDEPENDENT_AMBULATORY_CARE_PROVIDER_SITE_OTHER): Payer: BC Managed Care – PPO

## 2022-09-25 DIAGNOSIS — M545 Low back pain, unspecified: Secondary | ICD-10-CM

## 2022-09-25 MED ORDER — METHYLPREDNISOLONE 4 MG PO TBPK: ORAL_TABLET | ORAL | 0 refills | Status: DC

## 2022-09-25 MED ORDER — BACLOFEN 10 MG PO TABS: 10.0000 mg | ORAL_TABLET | Freq: Three times a day (TID) | ORAL | 0 refills | Status: AC

## 2022-09-25 MED ORDER — KETOROLAC TROMETHAMINE 30 MG/ML IJ SOLN
30.0000 mg | Freq: Once | INTRAMUSCULAR | Status: AC
  Administered 2022-09-25: 30 mg via INTRAMUSCULAR

## 2022-09-25 NOTE — ED Provider Notes (Signed)
[facility]    CSN: [id number] Arrival date & time: 09/25/22  1617    HISTORY   Chief Complaint  Patient presents with   Back Pain    Entered by patient   HPI Haley Garza is a pleasant, 46 y.o. female who presents to urgent care today. Patient complains of recurrent lower back pain for the past 4 days.  Patient states initial onset of injury was in May of this year.  Patient states she fell out of a chair at work, was evaluated in the emergency room and there were no acute findings.  X-ray from ED visit reviewed by me, patient had mild degenerative changes at L3-L4 and no acute findings.  Patient has had multiple follow-up visits with family medicine since then, patient has been alternately advised to take ibuprofen and naproxen for her pain.  The history is provided by the patient. No language interpreter was used.   Past Medical History:  Diagnosis Date   Anemia    Anxiety    Arthritis    Depression    History of kidney stones    IFG (impaired fasting glucose)    Obesity    PONV (postoperative nausea and vomiting)    Tobacco abuse    Patient Active Problem List   Diagnosis Date Noted   Vitamin D deficiency 05/02/2021   S/P laparoscopic hysterectomy 06/18/2017   Bradycardia, sinus 09/14/2015   Bariatric surgery status 09/14/2015   Asthma 09/14/2015   Anxiety and depression 09/14/2015   Tobacco abuse 09/14/2015   IFG (impaired fasting glucose) 09/14/2015   Past Surgical History:  Procedure Laterality Date   BARIATRIC SURGERY     BREAST BIOPSY Right 03/22/2021   [place] bx of mass, venus marker, path pending   CESAREAN SECTION     x 2    CYSTOSCOPY N/A 06/18/2017   Procedure: CYSTOSCOPY;  Surgeon: [name], MD;  Location: [facility];  Service: Gynecology;  Laterality: N/A;   INTRAUTERINE DEVICE (IUD) INSERTION     feb 2014   KNEE ARTHROSCOPY Right    LAPAROSCOPIC HYSTERECTOMY N/A 06/18/2017   Procedure: HYSTERECTOMY TOTAL LAPAROSCOPIC;   Surgeon: [name], MD;  Location: [facility];  Service: Gynecology;  Laterality: N/A;   TOTAL ABDOMINAL HYSTERECTOMY     TUBAL LIGATION     UTERINE FIBROID EMBOLIZATION  01/09/2017   Triangular vascular   OB History     Gravida  3   Para  3   Term  3   Preterm      AB      Living  3      SAB      IAB      Ectopic      Multiple      Live Births  3          Home Medications    Prior to Admission medications   Medication Sig Start Date End Date Taking? Authorizing Provider  albuterol (VENTOLIN HFA) 108 (90 Base) MCG/ACT inhaler Inhale 2 puffs into the lungs every 6 (six) hours as needed for wheezing or shortness of breath. 11/01/20   Marlana Salvage, PA  Ascorbic Acid (VITAMIN C) 1000 MG tablet Take 1,000 mg by mouth daily. Chewable    [provider]  b complex vitamins tablet Take 1 tablet by mouth daily.    [provider]  Biotin 5000 MCG CAPS Take 1 capsule by mouth daily.    [provider]  bu[facility]ion [facility] SR) 150  MG 12 hr tablet Take 1 tablet (150 mg total) by mouth 2 (two) times daily. Patient not taking: Reported on 03/15/2022 03/03/21   Park Liter P, DO  Cholecalciferol (VITAMIN D3) 5000 units CAPS Take 5,000 Units by mouth daily.    [provider]  ferrous sulfate 325 (65 FE) MG tablet Take 65 mg by mouth daily with breakfast.    [provider]  metaxalone (SKELAXIN) 800 MG tablet Take 1 tablet (800 mg total) by mouth 3 (three) times daily. 03/15/22   Park Liter P, DO  Multiple Vitamin (MULTIVITAMIN) tablet Take 1 tablet by mouth daily.    [provider]  naproxen (NAPROSYN) 500 MG tablet Take 1 tablet (500 mg total) by mouth 2 (two) times daily with a meal. 03/15/22   Johnson, Megan P, DO  predniSONE (DELTASONE) 10 MG tablet Take 10 mg by mouth 2 (two) times daily. 03/14/22   [provider]  vitamin A 8000 UNIT capsule Take 8,000 Units by mouth daily.    [provider]  vitamin E 200 UNIT capsule Take 200 Units by mouth daily.    [provider]  VITAMIN K PO Take 100 mcg by mouth daily.    [provider]    Family History Family History  Problem Relation Age of Onset   Hypertension Mother    Hyperlipidemia Mother    Depression Mother    Fibroids Mother    Breast cancer Neg Hx    Social History Social History   Tobacco Use   Smoking status: Former    Packs/day: 0.00    Types: Cigarettes   Smokeless tobacco: Never   Tobacco comments:    a cigarrete every few days   Vaping Use   Vaping Use: Never used  Substance Use Topics   Alcohol use: Yes    Comment: occ   Drug use: No   Allergies   Honey bee treatment [bee venom] and Oxycodone-acetaminophen  Review of Systems Review of Systems Pertinent findings revealed after performing a 14 point review of systems has been noted in the history of present illness.  Physical Exam Triage Vital Signs ED Triage Vitals  Enc Vitals Group     BP 08/25/21 0827 (!) 147/82     Pulse Rate 08/25/21 0827 72     Resp 08/25/21 0827 18     Temp 08/25/21 0827 98.3 F (36.8 C)     Temp Source 08/25/21 0827 Oral     SpO2 08/25/21 0827 98 %     Weight --      Height --      Head Circumference --      Peak Flow --      Pain Score 08/25/21 0826 5     Pain Loc --      Pain Edu? --      Excl. in [place]? --    Updated Vital Signs BP 103/65 (BP Location: Right Arm)   Pulse (!) 55   Temp 98.5 F (36.9 C) (Oral)   Resp 16   LMP 05/27/2017 Comment: preg test neg  SpO2 96%   Physical Exam Vitals and nursing note reviewed.  Constitutional:      General: She is not in acute distress.    Appearance: Normal appearance.  HENT:     Head: Normocephalic and atraumatic.  Eyes:     Pupils: Pupils are equal, round, and reactive to light.  Cardiovascular:     Rate and Rhythm: Normal rate  and regular rhythm.  Pulmonary:     Effort: Pulmonary effort is normal.     Breath sounds:  Normal breath sounds.  Musculoskeletal:     Cervical back: Normal range of motion and neck supple.     Lumbar back: Spasms, tenderness and bony tenderness present. No swelling, edema, deformity, signs of trauma or lacerations. Decreased range of motion. Positive right straight leg raise test and positive left straight leg raise test. No scoliosis.  Skin:    General: Skin is warm and dry.  Neurological:     General: No focal deficit present.     Mental Status: She is alert and oriented to person, place, and time. Mental status is at baseline.  Psychiatric:        Mood and Affect: Mood normal.        Behavior: Behavior normal.        Thought Content: Thought content normal.        Judgment: Judgment normal.     UC Couse / Diagnostics / Procedures:     Radiology No results found.  Procedures Procedures (including critical care time) EKG  Pending results:  Labs Reviewed - No data to display  Medications Ordered in UC: Medications  ketorolac (TORADOL) 30 MG/ML injection 30 mg (30 mg Intramuscular Given 09/25/22 1831)    UC Diagnoses / Final Clinical Impressions(s)   I have reviewed the triage vital signs and the nursing notes.  Pertinent labs & imaging results that were available during my care of the patient were reviewed by me and considered in my medical decision making (see chart for details).    Final diagnoses:  Acute bilateral low back pain without sciatica    Patient was provided with an injection during their visit today for acute pain relief.  Patient was advised to:  Begin Medrol dose pack Take Baclofen 10 mg 3 times daily (Patient has been advised that if this makes them sleepy, they can just take this at bedtime, up to 20 mg per dose, and try breaking the tablets in half or 5 mg per dose during the day) Apply ice pack to affected area 4 times daily for 20 minutes each time Apply topical Voltaren gel 4 times daily as needed Avoid stretching or strengthening  exercises until pain is completely resolved Return to urgent care in the next 2 to 3 days for repeat ketorolac injection if needed Consider physical therapy, chiropractic care, orthopedic follow-up Return precautions advised  ED Prescriptions     Medication Sig Dispense Auth. Provider   baclofen (LIORESAL) 10 MG tablet Take 1 tablet (10 mg total) by mouth 3 (three) times daily for 7 days. 21 tablet Lynden Oxford Scales, PA-C   methylPREDNISolone (MEDROL DOSEPAK) 4 MG TBPK tablet Take 24 mg on day 1, 20 mg on day 2, 16 mg on day 3, 12 mg on day 4, 8 mg on day 5, 4 mg on day 6.  Take all tablets in each row at once, do not spread tablets out throughout the day. 21 tablet Lynden Oxford Scales, PA-C      PDMP not reviewed this encounter.  Discharge Instructions:   Discharge Instructions      The mainstay of therapy for musculoskeletal pain is reduction of inflammation and relaxation of tension which is causing inflammation.  Keep in mind, pain always begets more pain.  To help you stay ahead of your pain and inflammation, I have provided the following regimen for you:   During your visit  today, you received an injection of ketorolac, high-dose nonsteroidal anti-inflammatory pain medication that should significantly reduce your pain for the next 6 to 8 hours.    This evening, you can begin taking baclofen 10 mg.  This is a highly effective muscle relaxer and antispasmodic which should continue to provide you with relaxation of your tense muscles, allow you to sleep well and to keep your pain under control.  You can continue taking this medication 3 times daily as you need to.  If you find that this medication makes you too sleepy, you can break them in half for your daytime doses and, if needed double them for your nighttime dose.  Do not take more than 30 mg of baclofen in a 24-hour period.   Tomorrow morning, please begin taking methylprednisolone.  Please take 1 full row tablets at once  with your breakfast meal.  If you have had significant resolution of your pain before you finish the entire prescription, please feel free to discontinue.  It is not important to finish every ta dose blet.   During the day, please set aside time to apply ice to the affected area 4 times daily for 20 minutes each application.  This can be achieved by using a bag of frozen peas or corn, a Ziploc bag filled with ice and water, or Ziploc bag filled with half rubbing alcohol and half Dawn dish detergent, frozen into a slush.  Please be careful not to apply ice directly to your skin, always place a soft cloth between you and the ice pack.   You are welcome to use topical anti-inflammatory creams such as Voltaren gel, capsaicin or Aspercreme as recommended.  These medications are available over-the-counter, please follow manufactures instructions for use.     Please avoid attempts to stretch or strengthen the affected area until you are feeling completely pain-free.  Attempts to do so will only prolong the healing process.   Please consider discussing referral to physical therapy with your primary care provider.  Physical therapist are very good at teasing out the underlying cause of acute lower back pain and helping with prevention of future recurrences.   If you would like to try to return return to urgent care in the next 2 to 3 days for repeat ketorolac injection, you are welcome to do so.   I also recommend that you remain out of work for the next several days, I provided you with a note to return to work in 3 days.  If you feel that you need this time extended, please follow-up with your primary care provider or return to urgent care for reevaluation so that we can provide you with a note for another 3 days.   Thank you for visiting urgent care today.  We appreciate the opportunity to participate in your care.       Disposition Upon Discharge:  Condition: stable for discharge home Home: take  medications as prescribed; routine discharge instructions as discussed; follow up as advised.  Patient presented with an acute illness with associated systemic symptoms and significant discomfort requiring urgent management. In my opinion, this is a condition that a prudent lay person (someone who possesses an average knowledge of health and medicine) may potentially expect to result in complications if not addressed urgently such as respiratory distress, impairment of bodily function or dysfunction of bodily organs.   Routine symptom specific, illness specific and/or disease specific instructions were discussed with the patient and/or caregiver at length.  As such, the patient has been evaluated and assessed, work-up was performed and treatment was provided in alignment with urgent care protocols and evidence based medicine.  Patient/parent/caregiver has been advised that the patient may require follow up for further testing and treatment if the symptoms continue in spite of treatment, as clinically indicated and appropriate.  Patient/parent/caregiver has been advised to report to orthopedic urgent care clinic or return to [facility] or PCP in 3-5 days if no better; follow-up with orthopedics, PCP or the Emergency Department if new signs and symptoms develop or if the current signs or symptoms continue to change or worsen for further workup, evaluation and treatment as clinically indicated and appropriate  The patient will follow up with their current PCP if and as advised. If the patient does not currently have a PCP we will have assisted them in obtaining one.   The patient may need specialty follow up if the symptoms continue, in spite of conservative treatment and management, for further workup, evaluation, consultation and treatment as clinically indicated and appropriate.  Patient/parent/caregiver verbalized understanding and agreement of plan as discussed.  All questions were addressed during  visit.  Please see discharge instructions below for further details of plan.  This office note has been dictated using Museum/gallery curator.  Unfortunately, this method of dictation can sometimes lead to typographical or grammatical errors.  I apologize for your inconvenience in advance if this occurs.  Please do not hesitate to reach out to me if clarification is needed.      Lynden Oxford Scales, [place] 09/25/22 1914

## 2022-09-25 NOTE — Discharge Instructions (Signed)
The mainstay of therapy for musculoskeletal pain is reduction of inflammation and relaxation of tension which is causing inflammation.  Keep in mind, pain always begets more pain.  To help you stay ahead of your pain and inflammation, I have provided the following regimen for you:   During your visit today, you received an injection of ketorolac, high-dose nonsteroidal anti-inflammatory pain medication that should significantly reduce your pain for the next 6 to 8 hours.    This evening, you can begin taking baclofen 10 mg.  This is a highly effective muscle relaxer and antispasmodic which should continue to provide you with relaxation of your tense muscles, allow you to sleep well and to keep your pain under control.  You can continue taking this medication 3 times daily as you need to.  If you find that this medication makes you too sleepy, you can break them in half for your daytime doses and, if needed double them for your nighttime dose.  Do not take more than 30 mg of baclofen in a 24-hour period.   Tomorrow morning, please begin taking methylprednisolone.  Please take 1 full row tablets at once with your breakfast meal.  If you have had significant resolution of your pain before you finish the entire prescription, please feel free to discontinue.  It is not important to finish every ta dose blet.   During the day, please set aside time to apply ice to the affected area 4 times daily for 20 minutes each application.  This can be achieved by using a bag of frozen peas or corn, a Ziploc bag filled with ice and water, or Ziploc bag filled with half rubbing alcohol and half Dawn dish detergent, frozen into a slush.  Please be careful not to apply ice directly to your skin, always place a soft cloth between you and the ice pack.   You are welcome to use topical anti-inflammatory creams such as Voltaren gel, capsaicin or Aspercreme as recommended.  These medications are available over-the-counter, please  follow manufactures instructions for use.     Please avoid attempts to stretch or strengthen the affected area until you are feeling completely pain-free.  Attempts to do so will only prolong the healing process.   Please consider discussing referral to physical therapy with your primary care provider.  Physical therapist are very good at teasing out the underlying cause of acute lower back pain and helping with prevention of future recurrences.   If you would like to try to return return to urgent care in the next 2 to 3 days for repeat ketorolac injection, you are welcome to do so.   I also recommend that you remain out of work for the next several days, I provided you with a note to return to work in 3 days.  If you feel that you need this time extended, please follow-up with your primary care provider or return to urgent care for reevaluation so that we can provide you with a note for another 3 days.   Thank you for visiting urgent care today.  We appreciate the opportunity to participate in your care.

## 2022-09-25 NOTE — ED Triage Notes (Signed)
Pt states lower back pain for the past 4 days,states she injured it back in May 2023 after a fall and she has periodic pain ever since.

## 2022-09-26 ENCOUNTER — Other Ambulatory Visit: Payer: Self-pay | Admitting: Podiatry

## 2022-09-26 DIAGNOSIS — Z79899 Other long term (current) drug therapy: Secondary | ICD-10-CM

## 2022-09-26 LAB — CBC WITH DIFFERENTIAL/PLATELET
Basophils Absolute: 0 10*3/uL (ref 0.0–0.2)
Basos: 0 %
EOS (ABSOLUTE): 0.2 10*3/uL (ref 0.0–0.4)
Eos: 3 %
Hematocrit: 39.3 % (ref 34.0–46.6)
Hemoglobin: 13.2 g/dL (ref 11.1–15.9)
Immature Grans (Abs): 0 10*3/uL (ref 0.0–0.1)
Immature Granulocytes: 0 %
Lymphocytes Absolute: 2.1 10*3/uL (ref 0.7–3.1)
Lymphs: 35 %
MCH: 32.4 pg (ref 26.6–33.0)
MCHC: 33.6 g/dL (ref 31.5–35.7)
MCV: 97 fL (ref 79–97)
Monocytes Absolute: 0.6 10*3/uL (ref 0.1–0.9)
Monocytes: 9 %
Neutrophils Absolute: 3.2 10*3/uL (ref 1.4–7.0)
Neutrophils: 53 %
Platelets: 263 10*3/uL (ref 150–450)
RBC: 4.07 x10E6/uL (ref 3.77–5.28)
RDW: 12.4 % (ref 11.7–15.4)
WBC: 6 10*3/uL (ref 3.4–10.8)

## 2022-09-26 LAB — HEPATIC FUNCTION PANEL
ALT: 27 IU/L (ref 0–32)
AST: 17 IU/L (ref 0–40)
Albumin: 4.1 g/dL (ref 3.9–4.9)
Alkaline Phosphatase: 69 IU/L (ref 44–121)
Bilirubin Total: 0.7 mg/dL (ref 0.0–1.2)
Bilirubin, Direct: 0.23 mg/dL (ref 0.00–0.40)
Total Protein: 6.3 g/dL (ref 6.0–8.5)

## 2022-09-26 MED ORDER — TERBINAFINE HCL 250 MG PO TABS: 250.0000 mg | ORAL_TABLET | Freq: Every day | ORAL | 0 refills | Status: DC

## 2022-09-28 ENCOUNTER — Ambulatory Visit
Admission: RE | Admit: 2022-09-28 | Discharge: 2022-09-28 | Disposition: A | Discharge status: Home / Self Care | Payer: BC Managed Care – PPO | Source: Ambulatory Visit

## 2022-09-28 VITALS — BP 113/73 | HR 62 | Temp 98.1°F | Resp 16

## 2022-09-28 DIAGNOSIS — J019 Acute sinusitis, unspecified: Secondary | ICD-10-CM | POA: Diagnosis not present

## 2022-09-28 DIAGNOSIS — B9689 Other specified bacterial agents as the cause of diseases classified elsewhere: Secondary | ICD-10-CM | POA: Diagnosis not present

## 2022-09-28 DIAGNOSIS — M545 Low back pain, unspecified: Secondary | ICD-10-CM | POA: Diagnosis not present

## 2022-09-28 DIAGNOSIS — J329 Chronic sinusitis, unspecified: Secondary | ICD-10-CM | POA: Diagnosis not present

## 2022-09-28 MED ORDER — KETOROLAC TROMETHAMINE 30 MG/ML IJ SOLN
30.0000 mg | Freq: Once | INTRAMUSCULAR | Status: AC
  Administered 2022-09-28: 30 mg via INTRAMUSCULAR

## 2022-09-28 MED ORDER — FLUTICASONE PROPIONATE 50 MCG/ACT NA SUSP: 1.0000 | Freq: Every day | NASAL | 2 refills | Status: DC

## 2022-09-28 MED ORDER — DOXYCYCLINE HYCLATE 100 MG PO TABS: 100.0000 mg | ORAL_TABLET | Freq: Two times a day (BID) | ORAL | 0 refills | Status: AC

## 2022-09-28 NOTE — ED Provider Notes (Signed)
[facility]    CSN: [id number] Arrival date & time: 09/28/22  1717    HISTORY   Chief Complaint  Patient presents with  . Back Pain    Entered by patient  . Appointment    1700   HPI Haley Garza is a pleasant, 46 y.o. female who presents to urgent care today. Patient returns for follow-up of 2 to 3-week history of lower back pain.  Patient states she has been taking Tylenol and the muscle relaxer with some relief however states the muscle relaxer gives her headache.  Patient states overall she is feeling much better.  Patient is requesting a repeat injection of ketorolac today.  On further questioning, patient reports a history of allergies and states for the past few weeks she has been experiencing sinus pressure with minimal sinus drainage.  Patient denies fever, aches, chills, known sick contacts, body aches, cough, otalgia.  The history is provided by the patient.   Past Medical History:  Diagnosis Date  . Anemia   . Anxiety   . Arthritis   . Depression   . History of kidney stones   . IFG (impaired fasting glucose)   . Obesity   . PONV (postoperative nausea and vomiting)   . Tobacco abuse    Patient Active Problem List   Diagnosis Date Noted  . Vitamin D deficiency 05/02/2021  . S/P laparoscopic hysterectomy 06/18/2017  . 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 Bariatric surgery status 09/14/2015  . Asthma 09/14/2015  . Anxiety and depression 09/14/2015  . Tobacco abuse 09/14/2015  . IFG (impaired fasting glucose) 09/14/2015   Past Surgical History:  Procedure Laterality Date  . BARIATRIC SURGERY    . BREAST BIOPSY Right 03/22/2021   [place] bx of mass, venus marker, path pending  . CESAREAN SECTION     x 2   . CYSTOSCOPY N/A 06/18/2017   Procedure: CYSTOSCOPY;  Surgeon: [name], MD;  Location: [facility];  Service: Gynecology;  Laterality: N/A;  . INTRAUTERINE DEVICE (IUD) INSERTION     feb 2014  . KNEE ARTHROSCOPY Right   . LAPAROSCOPIC  HYSTERECTOMY N/A 06/18/2017   Procedure: HYSTERECTOMY TOTAL LAPAROSCOPIC;  Surgeon: [name], MD;  Location: [facility];  Service: Gynecology;  Laterality: N/A;  . TOTAL ABDOMINAL HYSTERECTOMY    . TUBAL LIGATION    . UTERINE FIBROID EMBOLIZATION  01/09/2017   Triangular vascular   OB History     Gravida  3   Para  3   Term  3   Preterm      AB      Living  3      SAB      IAB      Ectopic      Multiple      Live Births  3          Home Medications    Prior to Admission medications   Medication Sig Start Date End Date Taking? Authorizing Provider  acetaminophen (TYLENOL) 500 MG tablet Take 500 mg by mouth every 6 (six) hours as needed.   Yes [provider]  terbinafine (LAMISIL) 250 MG tablet Take 1 tablet (250 mg total) by mouth daily. 09/26/22   Trula Slade, DPM  albuterol (VENTOLIN HFA) 108 (90 Base) MCG/ACT inhaler Inhale 2 puffs into the lungs every 6 (six) hours as needed for wheezing or shortness of breath. 11/01/20   Marlana Salvage, PA  Ascorbic Acid (VITAMIN C) 1000 MG tablet Take 1,000  mg by mouth daily. Chewable    [provider]  b complex vitamins tablet Take 1 tablet by mouth daily.    [provider]  baclofen (LIORESAL) 10 MG tablet Take 1 tablet (10 mg total) by mouth 3 (three) times daily for 7 days. 09/25/22 10/02/22  Lynden Oxford Scales, PA-C  Biotin 5000 MCG CAPS Take 1 capsule by mouth daily.    [provider]  Cholecalciferol (VITAMIN D3) 5000 units CAPS Take 5,000 Units by mouth daily.    [provider]  ferrous sulfate 325 (65 FE) MG tablet Take 65 mg by mouth daily with breakfast.    [provider]  methylPREDNISolone (MEDROL DOSEPAK) 4 MG TBPK tablet Take 24 mg on day 1, 20 mg on day 2, 16 mg on day 3, 12 mg on day 4, 8 mg on day 5, 4 mg on day 6.  Take all tablets in each row at once, do not spread tablets out throughout the day. 09/25/22   Lynden Oxford Scales,  PA-C  Multiple Vitamin (MULTIVITAMIN) tablet Take 1 tablet by mouth daily.    [provider]  naproxen (NAPROSYN) 500 MG tablet Take 1 tablet (500 mg total) by mouth 2 (two) times daily with a meal. 03/15/22   Johnson, Megan P, DO  vitamin A 8000 UNIT capsule Take 8,000 Units by mouth daily.    [provider]  vitamin E 200 UNIT capsule Take 200 Units by mouth daily.    [provider]  VITAMIN K PO Take 100 mcg by mouth daily.    [provider]    Family History Family History  Problem Relation Age of Onset  . Hypertension Mother   . Hyperlipidemia Mother   . Depression Mother   . Fibroids Mother   . Breast cancer Neg Hx    Social History Social History   Tobacco Use  . Smoking status: Former    Packs/day: 0.00    Types: Cigarettes  . Smokeless tobacco: Never  . Tobacco comments:    a cigarrete every few days   Vaping Use  . Vaping Use: Never used  Substance Use Topics  . Alcohol use: Yes    Comment: occ  . Drug use: No   Allergies   Honey bee treatment [bee venom] and Oxycodone-acetaminophen  Review of Systems Review of Systems Pertinent findings revealed after performing a 14 point review of systems has been noted in the history of present illness.  Physical Exam Triage Vital Signs ED Triage Vitals  Enc Vitals Group     BP 08/25/21 0827 (!) 147/82     Pulse Rate 08/25/21 0827 72     Resp 08/25/21 0827 18     Temp 08/25/21 0827 98.3 F (36.8 C)     Temp Source 08/25/21 0827 Oral     SpO2 08/25/21 0827 98 %     Weight --      Height --      Head Circumference --      Peak Flow --      Pain Score 08/25/21 0826 5     Pain Loc --      Pain Edu? --      Excl. in [place]? --   No data found.  Updated Vital Signs BP 113/73 (BP Location: Right Arm)   Pulse 62   Temp 98.1 F (36.7 C) (Oral)   Resp 16   LMP 05/27/2017 Comment: preg test neg  SpO2 97%  Physical Exam Vitals and nursing note reviewed.  Constitutional:       General: She is not in acute distress.    Appearance: Normal appearance. She is not ill-appearing.  HENT:     Head: Normocephalic and atraumatic.     Salivary Glands: Right salivary gland is not diffusely enlarged or tender. Left salivary gland is not diffusely enlarged or tender.     Right Ear: Hearing and external ear normal. No drainage. A middle ear effusion is present. There is no impacted cerumen. Tympanic membrane is erythematous and bulging. Tympanic membrane is not injected.     Left Ear: Hearing and external ear normal. No drainage. A middle ear effusion is present. There is no impacted cerumen. Tympanic membrane is erythematous and bulging. Tympanic membrane is not injected.     Ears:     Comments: Bilateral EACs with erythema    Nose: Rhinorrhea present. No nasal deformity, septal deviation, signs of injury, nasal tenderness, mucosal edema or congestion. Rhinorrhea is clear.     Right Nostril: Occlusion present. No foreign body, epistaxis or septal hematoma.     Left Nostril: Occlusion present. No foreign body, epistaxis or septal hematoma.     Right Turbinates: Enlarged, swollen and pale.     Left Turbinates: Enlarged, swollen and pale.     Right Sinus: No maxillary sinus tenderness or frontal sinus tenderness.     Left Sinus: No maxillary sinus tenderness or frontal sinus tenderness.      Comments: Allergic salute    Mouth/Throat:     Lips: Pink. No lesions.     Mouth: Mucous membranes are moist. No oral lesions.     Pharynx: Oropharynx is clear. Uvula midline. No posterior oropharyngeal erythema or uvula swelling.     Tonsils: No tonsillar exudate. 0 on the right. 0 on the left.     Comments: Postnasal drip Eyes:     General: Lids are normal.        Right eye: No discharge.        Left eye: No discharge.     Extraocular Movements: Extraocular movements intact.     Conjunctiva/sclera: Conjunctivae normal.     Right eye: Right conjunctiva is not injected.     Left eye:  Left conjunctiva is not injected.  Neck:     Trachea: Trachea and phonation normal.  Cardiovascular:     Rate and Rhythm: Regular rhythm.     Pulses: Normal pulses.     Heart sounds: Normal heart sounds. No murmur heard.    No friction rub. No gallop.  Pulmonary:     Effort: Pulmonary effort is normal. No accessory muscle usage, prolonged expiration or respiratory distress.     Breath sounds: Normal breath sounds. No stridor, decreased air movement or transmitted upper airway sounds. No decreased breath sounds, wheezing, rhonchi or rales.  Chest:     Chest wall: No tenderness.  Musculoskeletal:        General: Normal range of motion.     Cervical back: Normal range of motion and neck supple. Normal range of motion.  Lymphadenopathy:     Cervical: No cervical adenopathy.  Skin:    General: Skin is warm and dry.     Findings: No erythema or rash.  Neurological:     General: No focal deficit present.     Mental Status: She is alert and oriented to person, place, and time.  Psychiatric:        Mood and Affect: Mood  normal.        Behavior: Behavior normal.    Visual Acuity Right Eye Distance:   Left Eye Distance:   Bilateral Distance:    Right Eye Near:   Left Eye Near:    Bilateral Near:     UC Couse / Diagnostics / Procedures:     Radiology No results found.  Procedures Procedures (including critical care time) EKG  Pending results:  Labs Reviewed - No data to display  Medications Ordered in UC: Medications  ketorolac (TORADOL) 30 MG/ML injection 30 mg (has no administration in time range)    UC Diagnoses / Final Clinical Impressions(s)   I have reviewed the triage vital signs and the nursing notes.  Pertinent labs & imaging results that were available during my care of the patient were reviewed by me and considered in my medical decision making (see chart for details).    Final diagnoses:  Acute bilateral low back pain without sciatica  Acute bacterial  sinusitis  Rhinosinusitis   Mild effusion present behind both TMs with mild erythema of both TMs, based on patient's complaint and duration of respiratory symptoms believe patient has a bacterial sinusitis and will provide her with a prescription for doxycycline for treatment.  Patient also advised to use Flonase to open up nasal passages to facilitate sinus drainage.  Patient provided with her repeat ketorolac injection as requested.  Return precautions advised.  ED Prescriptions     Medication Sig Dispense Auth. Provider   fluticasone (FLONASE) 50 MCG/ACT nasal spray Place 1 spray into both nostrils daily. Begin by using 2 sprays in each nare daily for 3 to 5 days, then decrease to 1 spray in each nare daily. 15.8 mL Lynden Oxford Scales, PA-C   doxycycline (VIBRA-TABS) 100 MG tablet Take 1 tablet (100 mg total) by mouth 2 (two) times daily for 10 days. 20 tablet Lynden Oxford Scales, PA-C      PDMP not reviewed this encounter.  Pending results:  Labs Reviewed - No data to display  Discharge Instructions:   Discharge Instructions      For your lower back pain, you received another injection of ketorolac during your visit today.  Please be sure that she finished the methylprednisolone exactly as prescribed.  Please also continue baclofen.  I believe that the frontal headache that you are experiencing is related to bacterial sinusitis likely due to flareup of allergic rhinitis.  I sent a prescription for doxycycline to your pharmacy, please take 1 tablet twice daily for the next 10 days.  I also sent a prescription for Flonase nasal steroid spray that should calm down the inflammation in your nose and allow things to open and drain more freely.  Please let us know if you have not had complete resolution of either issue after completing treatment.  Thank you for visiting urgent care today.        Disposition Upon Discharge:  Condition: stable for discharge home  Patient  presented with an acute illness with associated systemic symptoms and significant discomfort requiring urgent management. In my opinion, this is a condition that a prudent lay person (someone who possesses an average knowledge of health and medicine) may potentially expect to result in complications if not addressed urgently such as respiratory distress, impairment of bodily function or dysfunction of bodily organs.   Routine symptom specific, illness specific and/or disease specific instructions were discussed with the patient and/or caregiver at length.   As such, the patient has been evaluated  and assessed, work-up was performed and treatment was provided in alignment with urgent care protocols and evidence based medicine.  Patient/parent/caregiver has been advised that the patient may require follow up for further testing and treatment if the symptoms continue in spite of treatment, as clinically indicated and appropriate.  Patient/parent/caregiver has been advised to return to the [facility] or PCP if no better; to PCP or the Emergency Department if new signs and symptoms develop, or if the current signs or symptoms continue to change or worsen for further workup, evaluation and treatment as clinically indicated and appropriate  The patient will follow up with their current PCP if and as advised. If the patient does not currently have a PCP we will assist them in obtaining one.   The patient may need specialty follow up if the symptoms continue, in spite of conservative treatment and management, for further workup, evaluation, consultation and treatment as clinically indicated and appropriate.   Patient/parent/caregiver verbalized understanding and agreement of plan as discussed.  All questions were addressed during visit.  Please see discharge instructions below for further details of plan.  This office note has been dictated using Museum/gallery curator.  Unfortunately, this method of  dictation can sometimes lead to typographical or grammatical errors.  I apologize for your inconvenience in advance if this occurs.  Please do not hesitate to reach out to me if clarification is needed.      Lynden Oxford Scales, PA-C 09/30/22 1731

## 2022-09-28 NOTE — Discharge Instructions (Signed)
For your lower back pain, you received another injection of ketorolac during your visit today.  Please be sure that she finished the methylprednisolone exactly as prescribed.  Please also continue baclofen.  I believe that the frontal headache that you are experiencing is related to bacterial sinusitis likely due to flareup of allergic rhinitis.  I sent a prescription for doxycycline to your pharmacy, please take 1 tablet twice daily for the next 10 days.  I also sent a prescription for Flonase nasal steroid spray that should calm down the inflammation in your nose and allow things to open and drain more freely.  Please let us know if you have not had complete resolution of either issue after completing treatment.  Thank you for visiting urgent care today.

## 2022-09-28 NOTE — ED Triage Notes (Signed)
Pt reports lower back pain x 2-3 weeks. Tylenol and muscle relaxer gives some relief. Reports muscle relaxer gives headache.

## 2022-10-01 ENCOUNTER — Ambulatory Visit: Payer: BC Managed Care – PPO | Admitting: Family Medicine

## 2022-10-01 ENCOUNTER — Other Ambulatory Visit: Payer: Self-pay

## 2022-10-01 ENCOUNTER — Encounter: Payer: Self-pay | Admitting: Family Medicine

## 2022-10-01 VITALS — BP 96/61 | HR 56 | Temp 98.5°F | Ht 67.0 in | Wt 218.7 lb

## 2022-10-01 DIAGNOSIS — Z23 Encounter for immunization: Secondary | ICD-10-CM

## 2022-10-01 DIAGNOSIS — L600 Ingrowing nail: Secondary | ICD-10-CM

## 2022-10-01 DIAGNOSIS — M545 Low back pain, unspecified: Secondary | ICD-10-CM

## 2022-10-01 MED ORDER — KETOROLAC TROMETHAMINE 60 MG/2ML IM SOLN
60.0000 mg | Freq: Once | INTRAMUSCULAR | Status: AC
  Administered 2022-10-01: 60 mg via INTRAMUSCULAR

## 2022-10-01 NOTE — Progress Notes (Signed)
BP 96/61   Pulse (!) 56   Temp 98.5 F (36.9 C) (Oral)   Ht '5\' 7"'$  (1.702 m)   Wt 218 lb 11.2 oz (99.2 kg)   LMP 05/27/2017 Comment: preg test neg  SpO2 98%   BMI 34.25 kg/m    Subjective:    Patient ID: Haley Garza, female    DOB: Jan 05, 1976, 46 y.o.   MRN: [id number]  HPI: Haley Garza is a 46 y.o. female  Chief Complaint  Patient presents with   Back Pain   Referral   BACK PAIN Duration: 3 weeks Mechanism of injury: lifting a garbage bag Location: bilateral and low back Onset: sudden Severity: severe Quality: dull ache Frequency: intermittent Radiation: none Aggravating factors: prolonged sitting Alleviating factors: rest, ice, heat, laying, and muscle relaxer Status: stable Treatments attempted: shots x2, steroids, muscle relaxers  Relief with NSAIDs?: mild Nighttime pain:  no Paresthesias / decreased sensation:  no Bowel / bladder incontinence:  no Fevers:  no Dysuria / urinary frequency:  no  Relevant past medical, surgical, family and social history reviewed and updated as indicated. Interim medical history since our last visit reviewed. Allergies and medications reviewed and updated.  Review of Systems  Constitutional: Negative.   Respiratory: Negative.    Cardiovascular: Negative.   Gastrointestinal: Negative.   Musculoskeletal:  Positive for back pain and myalgias. Negative for arthralgias, gait problem, joint swelling, neck pain and neck stiffness.  Skin: Negative.   Neurological: Negative.   Psychiatric/Behavioral: Negative.      Per HPI unless specifically indicated above     Objective:    BP 96/61   Pulse (!) 56   Temp 98.5 F (36.9 C) (Oral)   Ht '5\' 7"'$  (1.702 m)   Wt 218 lb 11.2 oz (99.2 kg)   LMP 05/27/2017 Comment: preg test neg  SpO2 98%   BMI 34.25 kg/m   Wt Readings from Last 3 Encounters:  10/01/22 218 lb 11.2 oz (99.2 kg)  03/15/22 200 lb 3.2 oz (90.8 kg)  03/12/22 200 lb (90.7 kg)    Physical Exam Vitals and  nursing note reviewed.  Constitutional:      General: She is not in acute distress.    Appearance: Normal appearance. She is not ill-appearing, toxic-appearing or diaphoretic.  HENT:     Head: Normocephalic and atraumatic.     Right Ear: External ear normal.     Left Ear: External ear normal.     Nose: Nose normal.     Mouth/Throat:     Mouth: Mucous membranes are moist.     Pharynx: Oropharynx is clear.  Eyes:     General: No scleral icterus.       Right eye: No discharge.        Left eye: No discharge.     Extraocular Movements: Extraocular movements intact.     Conjunctiva/sclera: Conjunctivae normal.     Pupils: Pupils are equal, round, and reactive to light.  Cardiovascular:     Rate and Rhythm: Normal rate and regular rhythm.     Pulses: Normal pulses.     Heart sounds: Normal heart sounds. No murmur heard.    No friction rub. No gallop.  Pulmonary:     Effort: Pulmonary effort is normal. No respiratory distress.     Breath sounds: Normal breath sounds. No stridor. No wheezing, rhonchi or rales.  Chest:     Chest wall: No tenderness.  Musculoskeletal:        General: Normal  range of motion.     Cervical back: Normal range of motion and neck supple.  Skin:    General: Skin is warm and dry.     Capillary Refill: Capillary refill takes less than 2 seconds.     Coloration: Skin is not jaundiced or pale.     Findings: No bruising, erythema, lesion or rash.  Neurological:     General: No focal deficit present.     Mental Status: She is alert and oriented to person, place, and time. Mental status is at baseline.  Psychiatric:        Mood and Affect: Mood normal.        Behavior: Behavior normal.        Thought Content: Thought content normal.        Judgment: Judgment normal.     Results for orders placed or performed in visit on 09/17/22  CBC with Differential/Platelet  Result Value Ref Range   WBC 6.0 3.4 - 10.8 x10E3/uL   RBC 4.07 3.77 - 5.28 x10E6/uL    Hemoglobin 13.2 11.1 - 15.9 g/dL   Hematocrit 39.3 34.0 - 46.6 %   MCV 97 79 - 97 fL   MCH 32.4 26.6 - 33.0 pg   MCHC 33.6 31.5 - 35.7 g/dL   RDW 12.4 11.7 - 15.4 %   Platelets 263 150 - 450 x10E3/uL   Neutrophils 53 Not Estab. %   Lymphs 35 Not Estab. %   Monocytes 9 Not Estab. %   Eos 3 Not Estab. %   Basos 0 Not Estab. %   Neutrophils Absolute 3.2 1.4 - 7.0 x10E3/uL   Lymphocytes Absolute 2.1 0.7 - 3.1 x10E3/uL   Monocytes Absolute 0.6 0.1 - 0.9 x10E3/uL   EOS (ABSOLUTE) 0.2 0.0 - 0.4 x10E3/uL   Basophils Absolute 0.0 0.0 - 0.2 x10E3/uL   Immature Granulocytes 0 Not Estab. %   Immature Grans (Abs) 0.0 0.0 - 0.1 x10E3/uL  Hepatic Function Panel  Result Value Ref Range   Total Protein 6.3 6.0 - 8.5 g/dL   Albumin 4.1 3.9 - 4.9 g/dL   Bilirubin Total 0.7 0.0 - 1.2 mg/dL   Bilirubin, Direct 0.23 0.00 - 0.40 mg/dL   Alkaline Phosphatase 69 44 - 121 IU/L   AST 17 0 - 40 IU/L   ALT 27 0 - 32 IU/L      Assessment & Plan:   Problem List Items Addressed This Visit   None Visit Diagnoses     Acute bilateral low back pain without sciatica    -  Primary   Will get her in for PT and get her toradol shot today. If not better in 1 month, consider MRI.   Relevant Medications   ketorolac (TORADOL) injection 60 mg (Start on 10/01/2022 11:00 AM)   Other Relevant Orders   Ambulatory referral to Physical Therapy   Flu vaccine need       Relevant Orders   Flu Vaccine QUAD 6+ mos PF IM (Fluarix Quad PF) (Completed)        Follow up plan: Return in about 4 weeks (around 10/29/2022) for follow up back.

## 2022-10-09 ENCOUNTER — Ambulatory Visit: Payer: BC Managed Care – PPO | Admitting: Family Medicine

## 2022-10-10 ENCOUNTER — Other Ambulatory Visit: Payer: Self-pay

## 2022-10-10 ENCOUNTER — Ambulatory Visit: Payer: BC Managed Care – PPO | Attending: Family Medicine

## 2022-10-10 DIAGNOSIS — M5459 Other low back pain: Secondary | ICD-10-CM | POA: Diagnosis present

## 2022-10-10 DIAGNOSIS — R2689 Other abnormalities of gait and mobility: Secondary | ICD-10-CM

## 2022-10-10 DIAGNOSIS — M6281 Muscle weakness (generalized): Secondary | ICD-10-CM | POA: Diagnosis present

## 2022-10-10 DIAGNOSIS — M545 Low back pain, unspecified: Secondary | ICD-10-CM | POA: Insufficient documentation

## 2022-10-10 NOTE — Therapy (Signed)
OUTPATIENT PHYSICAL THERAPY THORACOLUMBAR EVALUATION   Patient Name: Haley Garza MRN: [id number] DOB:Oct 15, 1976, 46 y.o., female Today's Date: 10/10/2022  END OF SESSION:  PT End of Session - 10/10/22 1713     Visit Number 1    Number of Visits 17    Date for PT Re-Evaluation 12/05/22    Authorization Type BCBS    PT Start Time 1615    PT Stop Time 1655    PT Time Calculation (min) 40 min    Activity Tolerance Patient tolerated treatment well    Behavior During Therapy WFL for tasks assessed/performed             Past Medical History:  Diagnosis Date   Anemia    Anxiety    Arthritis    Depression    History of kidney stones    IFG (impaired fasting glucose)    Obesity    PONV (postoperative nausea and vomiting)    Tobacco abuse    Past Surgical History:  Procedure Laterality Date   BARIATRIC SURGERY     BREAST BIOPSY Right 03/22/2021   [place] bx of mass, venus marker, path pending   CESAREAN SECTION     x 2    CYSTOSCOPY N/A 06/18/2017   Procedure: CYSTOSCOPY;  Surgeon: [name], MD;  Location: [facility];  Service: Gynecology;  Laterality: N/A;   INTRAUTERINE DEVICE (IUD) INSERTION     feb 2014   KNEE ARTHROSCOPY Right    LAPAROSCOPIC HYSTERECTOMY N/A 06/18/2017   Procedure: HYSTERECTOMY TOTAL LAPAROSCOPIC;  Surgeon: [name], MD;  Location: [facility];  Service: Gynecology;  Laterality: N/A;   TOTAL ABDOMINAL HYSTERECTOMY     TUBAL LIGATION     UTERINE FIBROID EMBOLIZATION  01/09/2017   Triangular vascular   Patient Active Problem List   Diagnosis Date Noted   Vitamin D deficiency 05/02/2021   S/P laparoscopic hysterectomy 06/18/2017   Bradycardia, sinus 09/14/2015   Bariatric surgery status 09/14/2015   Asthma 09/14/2015   Anxiety and depression 09/14/2015   Tobacco abuse 09/14/2015   IFG (impaired fasting glucose) 09/14/2015    PCP: Valerie Roys, DO  REFERRING PROVIDER: Valerie Roys, DO   REFERRING DIAG: M54.50  (ICD-10-CM) - Acute bilateral low back pain without sciatica   Rationale for Evaluation and Treatment: Rehabilitation  THERAPY DIAG:  Other low back pain - Plan: PT plan of care cert/re-cert  Muscle weakness (generalized) - Plan: PT plan of care cert/re-cert  Other abnormalities of gait and mobility - Plan: PT plan of care cert/re-cert  ONSET DATE: May 2023  SUBJECTIVE:  SUBJECTIVE STATEMENT: Pt presents to PT with reports of LBP since fall in May 2023. Pain has been off and on, with recent flair when she was lifting a trash bag at home on 09/25/2022. She denies bowel/bladder changes or saddle anesthesia. Does have referral of pain in bilateral LE with R>L. Pain will refer to lateral R knee, denies N/T.   PERTINENT HISTORY:  Depression, Anxiety   PAIN:  Are you having pain?  Yes: NPRS scale: 2/10 Worst: 8/10 Pain location: lower back Pain description: dull ache Aggravating factors: bending, lifting Relieving factors: heat, ice, walking  PRECAUTIONS: None  WEIGHT BEARING RESTRICTIONS: No  FALLS:  Has patient fallen in last 6 months? 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 [employer]  PLOF: Independent and Independent with basic ADLs  PATIENT GOALS: decrease pain in back, improve endurance    OBJECTIVE:   DIAGNOSTIC FINDINGS:  N/A  PATIENT SURVEYS:  FOTO: 49% function; 63% predicted   COGNITION: Overall cognitive status: Within functional limits for tasks assessed   SENSATION: WFL  MUSCLE LENGTH: Hamstrings: Right WNL deg; Left WNL deg Marcello Moores test: Right (+) deg; Left (-) deg  POSTURE: rounded shoulders and increased lumbar lordosis  PALPATION: TTP to  bilateral lumbar paraspinals   LUMBAR ROM:   AROM eval  Flexion Decreased pain  Extension Increased pain  Right lateral flexion   Left lateral flexion   Right rotation reduced  Left rotation reduced   (Blank rows = not tested)  LOWER EXTREMITY MMT:    MMT Right eval Left eval  Hip flexion 4/5 4/5  Hip extension    Hip abduction 3+/5 3+/5  Hip adduction    Hip internal rotation    Hip external rotation    Knee flexion 5/5 5/5  Knee extension 5/5 5/5  Ankle dorsiflexion    Ankle plantarflexion    Ankle inversion    Ankle eversion     (Blank rows = not tested)  LUMBAR SPECIAL TESTS:  Straight leg raise test: Negative and Slump test: Negative  FUNCTIONAL TESTS:  30 Second Sit to Stand: 4 reps with UE  GAIT: Distance walked: 20f Assistive device utilized: None Level of assistance: Complete Independence Comments: antalgic gait R  TREATMENT: [facility] Adult PT Treatment:                                                DATE: 10/10/2022 Therapeutic Exercise: Seated sciatic nerve glide x 5 R Modified thomas stretch x 30" Supine PPT x 5 -5" hold Bridge x 5 Supine clamshell x 10 GTB  PATIENT EDUCATION:  Education details: eval findings, FOTO, HEP, POC Person educated: Patient Education method: Explanation, Demonstration, and Handouts Education comprehension: verbalized understanding and returned demonstration  HOME EXERCISE PROGRAM: [facility]: [email or web address] Date: 10/10/2022 Prepared by: DOctavio Manns Exercises - Seated Sciatic Tensioner  - 1 x daily - 7 x weekly - 2 sets - 10 reps - Modified Thomas Stretch  - 1 x daily - 7 x weekly - 2 sets - 30 sec hold - Supine Posterior Pelvic Tilt  - 1 x daily - 7 x weekly - 2 sets - 10 reps - 3 sec hold - Supine Bridge  - 1 x daily - 7 x weekly - 2 sets -  10 reps - Hooklying Clamshell with Resistance  - 1 x daily - 7 x weekly - 3 sets - 10 reps - green band hold  ASSESSMENT:  CLINICAL  IMPRESSION: Patient is a 47 y.o. F who was seen today for physical therapy evaluation and treatment for acute on chronic LBP. Physical findings are consistent with physician impression, as pt has decrease in core and proximal hip strength, reduced functional mobility assessed via 30 Second STS, and palpable pain to lower back musculature. Her FOTO score demonstrates decrease in subjective functional ability below PLOF. Pt would benefit from skilled PT services working on improving core and hip strength in order to decrease pain and improve function.   OBJECTIVE IMPAIRMENTS: decreased activity tolerance, decreased endurance, decreased mobility, decreased ROM, decreased strength, and pain.   ACTIVITY LIMITATIONS: carrying, lifting, bending, and squatting  PARTICIPATION LIMITATIONS: shopping, community activity, occupation, and yard work  PERSONAL FACTORS: Fitness and 1-2 comorbidities: Depression, Anxiety   are also affecting patient's functional outcome.   REHAB POTENTIAL: Excellent  CLINICAL DECISION MAKING: Stable/uncomplicated  EVALUATION COMPLEXITY: Low   GOALS: Goals reviewed with patient? No  SHORT TERM GOALS: Target date: 10/31/2022   Pt will be compliant and knowledgeable with initial HEP for improved comfort and carryover Baseline: initial HEP given  Goal status: INITIAL  2.  Pt will self report lower back pain no greater than 6/10 for improved comfort and functional ability Baseline: 8/10 at worst Goal status: INITIAL   LONG TERM GOALS: Target date: 12/05/2022   Pt will self report lower back pain no greater than 3/10 for improved comfort and functional ability Baseline: 8/10 at worst Goal status: INITIAL   2.  Pt will improve FOTO function score to no less than 63% as proxy for functional improvement Baseline: 49% function Goal status: INITIAL   3.  Pt will increase 30 Second Sit to Stand rep count to no less than 8 reps for improved balance, strength, and functional  mobility Baseline: 4 reps with UE Goal status: INITIAL   4.  Pt will improve bilateral LE strength to no less than 5/5 for improved comfort and functional mobility Baseline: see chart Goal status: INITIAL  PLAN:  PT FREQUENCY: 2x/week  PT DURATION: 8 weeks  PLANNED INTERVENTIONS: Therapeutic exercises, Therapeutic activity, Neuromuscular re-education, Balance training, Gait training, Patient/Family education, Self Care, Joint mobilization, Aquatic Therapy, Dry Needling, Electrical stimulation, Cryotherapy, Moist heat, Vasopneumatic device, Manual therapy, and Re-evaluation.  PLAN FOR NEXT SESSION: assess HEP response, core and hip strengthening    Ward Chatters, PT 10/10/2022, 5:15 PM

## 2022-10-16 ENCOUNTER — Ambulatory Visit: Payer: BC Managed Care – PPO

## 2022-10-16 DIAGNOSIS — R2689 Other abnormalities of gait and mobility: Secondary | ICD-10-CM

## 2022-10-16 DIAGNOSIS — M5459 Other low back pain: Secondary | ICD-10-CM

## 2022-10-16 DIAGNOSIS — M6281 Muscle weakness (generalized): Secondary | ICD-10-CM

## 2022-10-16 NOTE — Therapy (Signed)
OUTPATIENT PHYSICAL THERAPY TREATMENT NOTE   Patient Name: Haley Garza MRN: [id number] DOB:13-Aug-1976, 46 y.o., female Today's Date: 10/16/2022  PCP: Valerie Roys, DO  REFERRING PROVIDER: Valerie Roys, DO   END OF SESSION:   PT End of Session - 10/16/22 1650     Visit Number 2    Number of Visits 17    Date for PT Re-Evaluation 12/05/22    Authorization Type BCBS    PT Start Time 1700    PT Stop Time 1740    PT Time Calculation (min) 40 min    Activity Tolerance Patient tolerated treatment well    Behavior During Therapy WFL for tasks assessed/performed             Past Medical History:  Diagnosis Date   Anemia    Anxiety    Arthritis    Depression    History of kidney stones    IFG (impaired fasting glucose)    Obesity    PONV (postoperative nausea and vomiting)    Tobacco abuse    Past Surgical History:  Procedure Laterality Date   BARIATRIC SURGERY     BREAST BIOPSY Right 03/22/2021   [place] bx of mass, venus marker, path pending   CESAREAN SECTION     x 2    CYSTOSCOPY N/A 06/18/2017   Procedure: CYSTOSCOPY;  Surgeon: [name], MD;  Location: [facility];  Service: Gynecology;  Laterality: N/A;   INTRAUTERINE DEVICE (IUD) INSERTION     feb 2014   KNEE ARTHROSCOPY Right    LAPAROSCOPIC HYSTERECTOMY N/A 06/18/2017   Procedure: HYSTERECTOMY TOTAL LAPAROSCOPIC;  Surgeon: [name], MD;  Location: [facility];  Service: Gynecology;  Laterality: N/A;   TOTAL ABDOMINAL HYSTERECTOMY     TUBAL LIGATION     UTERINE FIBROID EMBOLIZATION  01/09/2017   Triangular vascular   Patient Active Problem List   Diagnosis Date Noted   Vitamin D deficiency 05/02/2021   S/P laparoscopic hysterectomy 06/18/2017   Bradycardia, sinus 09/14/2015   Bariatric surgery status 09/14/2015   Asthma 09/14/2015   Anxiety and depression 09/14/2015   Tobacco abuse 09/14/2015   IFG (impaired fasting glucose) 09/14/2015    REFERRING DIAG: M54.50 (ICD-10-CM) -  Acute bilateral low back pain without sciatica    THERAPY DIAG:  Other low back pain  Muscle weakness (generalized)  Other abnormalities of gait and mobility  Rationale for Evaluation and Treatment Rehabilitation  PERTINENT HISTORY: Depression, Anxiety    PRECAUTIONS: None  SUBJECTIVE:  SUBJECTIVE STATEMENT:  Patient reports improvements in low back pain and HEP compliance.  PAIN:  Are you having pain?  Yes: NPRS scale: 1/10 Worst: 8/10 Pain location: lower back Pain description: dull ache Aggravating factors: bending, lifting Relieving factors: heat, ice, walking   OBJECTIVE: (objective measures completed at initial evaluation unless otherwise dated)   DIAGNOSTIC FINDINGS:  N/A   PATIENT SURVEYS:  FOTO: 49% function; 63% predicted    COGNITION: Overall cognitive status: Within functional limits for tasks assessed            SENSATION: WFL   MUSCLE LENGTH: Hamstrings: Right WNL deg; Left WNL deg Marcello Moores test: Right (+) deg; Left (-) deg   POSTURE: rounded shoulders and increased lumbar lordosis   PALPATION: TTP to bilateral lumbar paraspinals    LUMBAR ROM:    AROM eval  Flexion Decreased pain  Extension Increased pain  Right lateral flexion    Left lateral flexion    Right rotation reduced  Left rotation reduced   (Blank rows = not tested)   LOWER EXTREMITY MMT:     MMT Right eval Left eval  Hip flexion 4/5 4/5  Hip extension      Hip abduction 3+/5 3+/5  Hip adduction      Hip internal rotation      Hip external rotation      Knee flexion 5/5 5/5  Knee extension 5/5 5/5  Ankle dorsiflexion      Ankle plantarflexion      Ankle inversion      Ankle eversion       (Blank rows = not tested)   LUMBAR SPECIAL TESTS:  Straight leg raise test: Negative and Slump  test: Negative   FUNCTIONAL TESTS:  30 Second Sit to Stand: 4 reps with UE   GAIT: Distance walked: 25f Assistive device utilized: None Level of assistance: Complete Independence Comments: antalgic gait R   TREATMENT: [facility] Adult PT Treatment:                                                DATE: 10/16/2022 Therapeutic Exercise: Nustep level 5 x 5 mins Sidestepping RTB at ankles at counter x4 laps Standing hip extension RTB at ankles 2x10 BIL Supine sciatic nerve glide x10 BIL Supine clamshell GTB 2x10 Supine marching GTB 2x10 BIL PPT 5" hold 2x10 Bridges 2x10 LTR x10 BIL Open books x10 BIL   [facility] Adult PT Treatment:                                                DATE: 10/10/2022 Therapeutic Exercise: Seated sciatic nerve glide x 5 R Modified thomas stretch x 30" Supine PPT x 5 -5" hold Bridge x 5 Supine clamshell x 10 GTB   PATIENT EDUCATION:  Education details: eval findings, FOTO, HEP, POC Person educated: Patient Education method: Explanation, Demonstration, and Handouts Education comprehension: verbalized understanding and returned demonstration   HOME EXERCISE PROGRAM: [email or web address]URL: [email or web address] Date: 10/10/2022 Prepared by: DOctavio Manns  Exercises - Seated Sciatic Tensioner  - 1 x daily - 7 x weekly - 2 sets - 10 reps - Modified Thomas Stretch  - 1 x daily - 7 x weekly - 2 sets -  30 sec hold - Supine Posterior Pelvic Tilt  - 1 x daily - 7 x weekly - 2 sets - 10 reps - 3 sec hold - Supine Bridge  - 1 x daily - 7 x weekly - 2 sets - 10 reps - Hooklying Clamshell with Resistance  - 1 x daily - 7 x weekly - 3 sets - 10 reps - green band hold   ASSESSMENT:   CLINICAL IMPRESSION: Patient presents for first follow up PT session reporting improvements in lower back pain and states the home exercises have been helping her. Session today focused on proximal hip and core strengthening. She has the most difficulty with hip  abduction, fatiguing quickly. Patient was able to tolerate all prescribed exercises with no adverse effects. Patient continues to benefit from skilled PT services and should be progressed as able to improve functional independence.     OBJECTIVE IMPAIRMENTS: decreased activity tolerance, decreased endurance, decreased mobility, decreased ROM, decreased strength, and pain.    ACTIVITY LIMITATIONS: carrying, lifting, bending, and squatting   PARTICIPATION LIMITATIONS: shopping, community activity, occupation, and yard work   PERSONAL FACTORS: Fitness and 1-2 comorbidities: Depression, Anxiety   are also affecting patient's functional outcome.    REHAB POTENTIAL: Excellent   CLINICAL DECISION MAKING: Stable/uncomplicated   EVALUATION COMPLEXITY: Low     GOALS: Goals reviewed with patient? No   SHORT TERM GOALS: Target date: 10/31/2022   Pt will be compliant and knowledgeable with initial HEP for improved comfort and carryover Baseline: initial HEP given  Goal status: INITIAL   2.  Pt will self report lower back pain no greater than 6/10 for improved comfort and functional ability Baseline: 8/10 at worst Goal status: INITIAL    LONG TERM GOALS: Target date: 12/05/2022   Pt will self report lower back pain no greater than 3/10 for improved comfort and functional ability Baseline: 8/10 at worst Goal status: INITIAL    2.  Pt will improve FOTO function score to no less than 63% as proxy for functional improvement Baseline: 49% function Goal status: INITIAL    3.  Pt will increase 30 Second Sit to Stand rep count to no less than 8 reps for improved balance, strength, and functional mobility Baseline: 4 reps with UE Goal status: INITIAL    4.  Pt will improve bilateral LE strength to no less than 5/5 for improved comfort and functional mobility Baseline: see chart Goal status: INITIAL   PLAN:   PT FREQUENCY: 2x/week   PT DURATION: 8 weeks   PLANNED INTERVENTIONS: Therapeutic  exercises, Therapeutic activity, Neuromuscular re-education, Balance training, Gait training, Patient/Family education, Self Care, Joint mobilization, Aquatic Therapy, Dry Needling, Electrical stimulation, Cryotherapy, Moist heat, Vasopneumatic device, Manual therapy, and Re-evaluation.   PLAN FOR NEXT SESSION: assess HEP response, core and hip strengthening     Margarette Canada, PTA 10/16/2022, 5:41 PM

## 2022-10-17 NOTE — Therapy (Signed)
OUTPATIENT PHYSICAL THERAPY TREATMENT NOTE   Patient Name: Haley Garza MRN: [id number] DOB:01/24/1976, 46 y.o., female Today's Date: 10/18/2022  PCP: Valerie Roys, DO  REFERRING PROVIDER: Valerie Roys, DO   END OF SESSION:   PT End of Session - 10/18/22 1523     Visit Number 3    Number of Visits 17    Date for PT Re-Evaluation 12/05/22    Authorization Type [employer]    PT Start Time 1530    PT Stop Time 1610    PT Time Calculation (min) 40 min    Activity Tolerance Patient tolerated treatment well    Behavior During Therapy WFL for tasks assessed/performed              Past Medical History:  Diagnosis Date   Anemia    Anxiety    Arthritis    Depression    History of kidney stones    IFG (impaired fasting glucose)    Obesity    PONV (postoperative nausea and vomiting)    Tobacco abuse    Past Surgical History:  Procedure Laterality Date   BARIATRIC SURGERY     BREAST BIOPSY Right 03/22/2021   [place] bx of mass, venus marker, path pending   CESAREAN SECTION     x 2    CYSTOSCOPY N/A 06/18/2017   Procedure: CYSTOSCOPY;  Surgeon: [name], MD;  Location: [facility];  Service: Gynecology;  Laterality: N/A;   INTRAUTERINE DEVICE (IUD) INSERTION     feb 2014   KNEE ARTHROSCOPY Right    LAPAROSCOPIC HYSTERECTOMY N/A 06/18/2017   Procedure: HYSTERECTOMY TOTAL LAPAROSCOPIC;  Surgeon: [name], MD;  Location: [facility];  Service: Gynecology;  Laterality: N/A;   TOTAL ABDOMINAL HYSTERECTOMY     TUBAL LIGATION     UTERINE FIBROID EMBOLIZATION  01/09/2017   Triangular vascular   Patient Active Problem List   Diagnosis Date Noted   Vitamin D deficiency 05/02/2021   S/P laparoscopic hysterectomy 06/18/2017   Bradycardia, sinus 09/14/2015   Bariatric surgery status 09/14/2015   Asthma 09/14/2015   Anxiety and depression 09/14/2015   Tobacco abuse 09/14/2015   IFG (impaired fasting glucose) 09/14/2015    REFERRING DIAG: M54.50 (ICD-10-CM) -  Acute bilateral low back pain without sciatica    THERAPY DIAG:  Other low back pain  Muscle weakness (generalized)  Other abnormalities of gait and mobility  Rationale for Evaluation and Treatment Rehabilitation  PERTINENT HISTORY: Depression, Anxiety    PRECAUTIONS: None  SUBJECTIVE:  SUBJECTIVE STATEMENT:  Patient reports mild pain in her lower back today.   PAIN:  Are you having pain?  Yes: NPRS scale: 3/10 Worst: 8/10 Pain location: lower back Pain description: dull ache Aggravating factors: bending, lifting Relieving factors: heat, ice, walking   OBJECTIVE: (objective measures completed at initial evaluation unless otherwise dated)   DIAGNOSTIC FINDINGS:  N/A   PATIENT SURVEYS:  FOTO: 49% function; 63% predicted    COGNITION: Overall cognitive status: Within functional limits for tasks assessed            SENSATION: WFL   MUSCLE LENGTH: Hamstrings: Right WNL deg; Left WNL deg Marcello Moores test: Right (+) deg; Left (-) deg   POSTURE: rounded shoulders and increased lumbar lordosis   PALPATION: TTP to bilateral lumbar paraspinals    LUMBAR ROM:    AROM eval  Flexion Decreased pain  Extension Increased pain  Right lateral flexion    Left lateral flexion    Right rotation reduced  Left rotation reduced   (Blank rows = not tested)   LOWER EXTREMITY MMT:     MMT Right eval Left eval  Hip flexion 4/5 4/5  Hip extension      Hip abduction 3+/5 3+/5  Hip adduction      Hip internal rotation      Hip external rotation      Knee flexion 5/5 5/5  Knee extension 5/5 5/5  Ankle dorsiflexion      Ankle plantarflexion      Ankle inversion      Ankle eversion       (Blank rows = not tested)   LUMBAR SPECIAL TESTS:  Straight leg raise test: Negative and Slump test:  Negative   FUNCTIONAL TESTS:  30 Second Sit to Stand: 4 reps with UE   GAIT: Distance walked: 61f Assistive device utilized: None Level of assistance: Complete Independence Comments: antalgic gait R   TREATMENT: [facility] Adult PT Treatment:                                                DATE: 10/18/2022 Therapeutic Exercise: Nustep level 5 x 5 mins Sidestepping RTB at ankles at counter x4 laps Standing hip extension RTB at ankles 2x10 BIL Palloff press 7# 2x10 BIL Supine clamshell GTB 3x10 Supine marching GTB 2x10 BIL PPT 5" hold 2x10 Bridges 2x10 Modalities: IFC to lumbar paraspinals, output to 8, monitored and checked in with patient during for intensity modification, with cold pack, pt in prone x 8 mins  [facility] Adult PT Treatment:                                                DATE: 10/16/2022 Therapeutic Exercise: Nustep level 5 x 5 mins Sidestepping RTB at ankles at counter x4 laps Standing hip extension RTB at ankles 2x10 BIL Supine sciatic nerve glide x10 BIL Supine clamshell GTB 2x10 Supine marching GTB 2x10 BIL PPT 5" hold 2x10 Bridges 2x10 LTR x10 BIL Open books x10 BIL   [facility] Adult PT Treatment:  DATE: 10/10/2022 Therapeutic Exercise: Seated sciatic nerve glide x 5 R Modified thomas stretch x 30" Supine PPT x 5 -5" hold Bridge x 5 Supine clamshell x 10 GTB   PATIENT EDUCATION:  Education details: eval findings, FOTO, HEP, POC Person educated: Patient Education method: Explanation, Demonstration, and Handouts Education comprehension: verbalized understanding and returned demonstration   HOME EXERCISE PROGRAM: Access Code: [facility] URL: [email or web address] Date: 10/10/2022 Prepared by: Octavio Manns   Exercises - Seated Sciatic Tensioner  - 1 x daily - 7 x weekly - 2 sets - 10 reps - Modified Thomas Stretch  - 1 x daily - 7 x weekly - 2 sets - 30 sec hold - Supine Posterior Pelvic Tilt  - 1  x daily - 7 x weekly - 2 sets - 10 reps - 3 sec hold - Supine Bridge  - 1 x daily - 7 x weekly - 2 sets - 10 reps - Hooklying Clamshell with Resistance  - 1 x daily - 7 x weekly - 3 sets - 10 reps - green band hold   ASSESSMENT:   CLINICAL IMPRESSION: Patient presents to PT session reporting mild pain in her lower back. Session today focused on core and proximal hip strengthening. During bridges, patient became tearful and stated her pain had increased to a 6-7 and that she was trying to push through it. Set patient up on IFC e-stim and a cold pack to decrease pain. Advised patient to not push through pain and to communicate when her pain is increasing during sessions. Patient continues to benefit from skilled PT services and should be progressed as able to improve functional independence.     OBJECTIVE IMPAIRMENTS: decreased activity tolerance, decreased endurance, decreased mobility, decreased ROM, decreased strength, and pain.    ACTIVITY LIMITATIONS: carrying, lifting, bending, and squatting   PARTICIPATION LIMITATIONS: shopping, community activity, occupation, and yard work   PERSONAL FACTORS: Fitness and 1-2 comorbidities: Depression, Anxiety   are also affecting patient's functional outcome.    REHAB POTENTIAL: Excellent   CLINICAL DECISION MAKING: Stable/uncomplicated   EVALUATION COMPLEXITY: Low     GOALS: Goals reviewed with patient? No   SHORT TERM GOALS: Target date: 10/31/2022   Pt will be compliant and knowledgeable with initial HEP for improved comfort and carryover Baseline: initial HEP given  Goal status: INITIAL   2.  Pt will self report lower back pain no greater than 6/10 for improved comfort and functional ability Baseline: 8/10 at worst Goal status: INITIAL    LONG TERM GOALS: Target date: 12/05/2022   Pt will self report lower back pain no greater than 3/10 for improved comfort and functional ability Baseline: 8/10 at worst Goal status: INITIAL    2.  Pt  will improve FOTO function score to no less than 63% as proxy for functional improvement Baseline: 49% function Goal status: INITIAL    3.  Pt will increase 30 Second Sit to Stand rep count to no less than 8 reps for improved balance, strength, and functional mobility Baseline: 4 reps with UE Goal status: INITIAL    4.  Pt will improve bilateral LE strength to no less than 5/5 for improved comfort and functional mobility Baseline: see chart Goal status: INITIAL   PLAN:   PT FREQUENCY: 2x/week   PT DURATION: 8 weeks   PLANNED INTERVENTIONS: Therapeutic exercises, Therapeutic activity, Neuromuscular re-education, Balance training, Gait training, Patient/Family education, Self Care, Joint mobilization, Aquatic Therapy, Dry Needling, Electrical stimulation, Cryotherapy, Moist heat, Vasopneumatic device, Manual  therapy, and Re-evaluation.   PLAN FOR NEXT SESSION: assess HEP response, core and hip strengthening     Margarette Canada, PTA 10/18/2022, 3:23 PM

## 2022-10-18 ENCOUNTER — Ambulatory Visit: Payer: BC Managed Care – PPO

## 2022-10-18 DIAGNOSIS — M6281 Muscle weakness (generalized): Secondary | ICD-10-CM

## 2022-10-18 DIAGNOSIS — M5459 Other low back pain: Secondary | ICD-10-CM | POA: Diagnosis not present

## 2022-10-18 DIAGNOSIS — R2689 Other abnormalities of gait and mobility: Secondary | ICD-10-CM

## 2022-10-26 NOTE — Therapy (Incomplete)
OUTPATIENT PHYSICAL THERAPY TREATMENT NOTE   Patient Name: Haley Garza MRN: [id number] DOB:[birth year], 46 y.o., female Today's Date: 10/26/2022  PCP: Valerie Roys, DO  REFERRING PROVIDER: Valerie Roys, DO   END OF SESSION:      Past Medical History:  Diagnosis Date   Anemia    Anxiety    Arthritis    Depression    History of kidney stones    IFG (impaired fasting glucose)    Obesity    PONV (postoperative nausea and vomiting)    Tobacco abuse    Past Surgical History:  Procedure Laterality Date   BARIATRIC SURGERY     BREAST BIOPSY Right 03/22/2021   [place] bx of mass, venus marker, path pending   CESAREAN SECTION     x 2    CYSTOSCOPY N/A 06/18/2017   Procedure: CYSTOSCOPY;  Surgeon: [name], MD;  Location: [facility];  Service: Gynecology;  Laterality: N/A;   INTRAUTERINE DEVICE (IUD) INSERTION     feb 2014   KNEE ARTHROSCOPY Right    LAPAROSCOPIC HYSTERECTOMY N/A 06/18/2017   Procedure: HYSTERECTOMY TOTAL LAPAROSCOPIC;  Surgeon: [name], MD;  Location: [facility];  Service: Gynecology;  Laterality: N/A;   TOTAL ABDOMINAL HYSTERECTOMY     TUBAL LIGATION     UTERINE FIBROID EMBOLIZATION  01/09/2017   Triangular vascular   Patient Active Problem List   Diagnosis Date Noted   Vitamin D deficiency 05/02/2021   S/P laparoscopic hysterectomy 06/18/2017   Bradycardia, sinus 09/14/2015   Bariatric surgery status 09/14/2015   Asthma 09/14/2015   Anxiety and depression 09/14/2015   Tobacco abuse 09/14/2015   IFG (impaired fasting glucose) 09/14/2015    REFERRING DIAG: M54.50 (ICD-10-CM) - Acute bilateral low back pain without sciatica    THERAPY DIAG:  No diagnosis found.  Rationale for Evaluation and Treatment Rehabilitation  PERTINENT HISTORY: Depression, Anxiety    PRECAUTIONS: None  SUBJECTIVE:                                                                                                                                                                                       SUBJECTIVE STATEMENT:  *** Patient reports mild pain in her lower back today.   PAIN:  Are you having pain?  Yes: NPRS scale: ***3/10 Worst: 8/10 Pain location: lower back Pain description: dull ache Aggravating factors: bending, lifting Relieving factors: heat, ice, walking   OBJECTIVE: (objective measures completed at initial evaluation unless otherwise dated)   DIAGNOSTIC FINDINGS:  N/A   PATIENT SURVEYS:  FOTO: 49% function; 63% predicted    COGNITION: Overall cognitive status: Within functional limits for tasks assessed  SENSATION: WFL   MUSCLE LENGTH: Hamstrings: Right WNL deg; Left WNL deg Marcello Moores test: Right (+) deg; Left (-) deg   POSTURE: rounded shoulders and increased lumbar lordosis   PALPATION: TTP to bilateral lumbar paraspinals    LUMBAR ROM:    AROM eval  Flexion Decreased pain  Extension Increased pain  Right lateral flexion    Left lateral flexion    Right rotation reduced  Left rotation reduced   (Blank rows = not tested)   LOWER EXTREMITY MMT:     MMT Right eval Left eval  Hip flexion 4/5 4/5  Hip extension      Hip abduction 3+/5 3+/5  Hip adduction      Hip internal rotation      Hip external rotation      Knee flexion 5/5 5/5  Knee extension 5/5 5/5  Ankle dorsiflexion      Ankle plantarflexion      Ankle inversion      Ankle eversion       (Blank rows = not tested)   LUMBAR SPECIAL TESTS:  Straight leg raise test: Negative and Slump test: Negative   FUNCTIONAL TESTS:  30 Second Sit to Stand: 4 reps with UE   GAIT: Distance walked: 32f Assistive device utilized: None Level of assistance: Complete Independence Comments: antalgic gait R   TREATMENT: [facility] Adult PT Treatment:                                                DATE: 10/30/2022 Therapeutic Exercise: Nustep level 5 x 5 mins Sidestepping RTB at ankles at counter x4 laps Standing hip extension RTB at  ankles 2x10 BIL Palloff press 7# 2x10 BIL Supine clamshell GTB 3x10 Supine marching GTB 2x10 BIL PPT 5" hold 2x10 Bridges 2x10 Maybe IFC? Maybe supine exercises on ice/heat?  [facility]                                                [place]: 10[place]18[facility]2022 Therapeutic Exercise: Nustep level 5 x 5 mins Sidestepping RTB at ankles at counter x4 laps Standing hip extension RTB at ankles 2x10 BIL Palloff press 7# 2x10 BIL Supine clamshell GTB 3x10 Supine marching GTB 2x10 BIL PPT 5" hold 2x10 Bridges 2x10 Modalities: IFC to lumbar paraspinals, output to 8, monitored and checked in with patient during for intensity modification, with cold pack, pt in prone x 8 mins  [facility] Adult PT Treatment:                                                DATE: 10/16/2022 Therapeutic Exercise: Nustep level 5 x 5 mins Sidestepping RTB at ankles at counter x4 laps Standing hip extension RTB at ankles 2x10 BIL Supine sciatic nerve glide x10 BIL Supine clamshell GTB 2x10 Supine marching GTB 2x10 BIL PPT 5" hold 2x10 Bridges 2x10 LTR x10 BIL Open books x10 BIL    PATIENT EDUCATION:  Education details: eval findings, FOTO, HEP, POC Person educated: Patient Education method: Explanation, Demonstration, and Handouts Education comprehension: verbalized understanding and returned demonstration  HOME EXERCISE PROGRAM: Access Code: [facility] URL: [email or web address] Date: 10/10/2022 Prepared by: Octavio Manns   Exercises - Seated Sciatic Tensioner  - 1 x daily - 7 x weekly - 2 sets - 10 reps - Modified Thomas Stretch  - 1 x daily - 7 x weekly - 2 sets - 30 sec hold - Supine Posterior Pelvic Tilt  - 1 x daily - 7 x weekly - 2 sets - 10 reps - 3 sec hold - Supine Bridge  - 1 x daily - 7 x weekly - 2 sets - 10 reps - Hooklying Clamshell with Resistance  - 1 x daily - 7 x weekly - 3 sets - 10 reps - green band hold   ASSESSMENT:   CLINICAL IMPRESSION: ***  Patient presents to  PT session reporting mild pain in her lower back. Session today focused on core and proximal hip strengthening. During bridges, patient became tearful and stated her pain had increased to a 6-7 and that she was trying to push through it. Set patient up on IFC e-stim and a cold pack to decrease pain. Advised patient to not push through pain and to communicate when her pain is increasing during sessions. Patient continues to benefit from skilled PT services and should be progressed as able to improve functional independence.     OBJECTIVE IMPAIRMENTS: decreased activity tolerance, decreased endurance, decreased mobility, decreased ROM, decreased strength, and pain.    ACTIVITY LIMITATIONS: carrying, lifting, bending, and squatting   PARTICIPATION LIMITATIONS: shopping, community activity, occupation, and yard work   PERSONAL FACTORS: Fitness and 1-2 comorbidities: Depression, Anxiety   are also affecting patient's functional outcome.    REHAB POTENTIAL: Excellent   CLINICAL DECISION MAKING: Stable/uncomplicated   EVALUATION COMPLEXITY: Low     GOALS: Goals reviewed with patient? No   SHORT TERM GOALS: Target date: 10/31/2022   Pt will be compliant and knowledgeable with initial HEP for improved comfort and carryover Baseline: initial HEP given  Goal status: INITIAL   2.  Pt will self report lower back pain no greater than 6/10 for improved comfort and functional ability Baseline: 8/10 at worst Goal status: INITIAL    LONG TERM GOALS: Target date: 12/05/2022   Pt will self report lower back pain no greater than 3/10 for improved comfort and functional ability Baseline: 8/10 at worst Goal status: INITIAL    2.  Pt will improve FOTO function score to no less than 63% as proxy for functional improvement Baseline: 49% function Goal status: INITIAL    3.  Pt will increase 30 Second Sit to Stand rep count to no less than 8 reps for improved balance, strength, and functional  mobility Baseline: 4 reps with UE Goal status: INITIAL    4.  Pt will improve bilateral LE strength to no less than 5/5 for improved comfort and functional mobility Baseline: see chart Goal status: INITIAL   PLAN:   PT FREQUENCY: 2x/week   PT DURATION: 8 weeks   PLANNED INTERVENTIONS: Therapeutic exercises, Therapeutic activity, Neuromuscular re-education, Balance training, Gait training, Patient/Family education, Self Care, Joint mobilization, Aquatic Therapy, Dry Needling, Electrical stimulation, Cryotherapy, Moist heat, Vasopneumatic device, Manual therapy, and Re-evaluation.   PLAN FOR NEXT SESSION: assess HEP response, core and hip strengthening     Margarette Canada, PTA 10/26/2022, 12:16 PM

## 2022-10-30 ENCOUNTER — Ambulatory Visit: Payer: BC Managed Care – PPO

## 2022-11-01 ENCOUNTER — Ambulatory Visit: Payer: BC Managed Care – PPO | Attending: Family Medicine

## 2022-11-01 DIAGNOSIS — M6281 Muscle weakness (generalized): Secondary | ICD-10-CM | POA: Insufficient documentation

## 2022-11-01 DIAGNOSIS — R2689 Other abnormalities of gait and mobility: Secondary | ICD-10-CM | POA: Insufficient documentation

## 2022-11-01 DIAGNOSIS — M5459 Other low back pain: Secondary | ICD-10-CM | POA: Diagnosis not present

## 2022-11-01 NOTE — Therapy (Signed)
OUTPATIENT PHYSICAL THERAPY TREATMENT NOTE   Patient Name: Haley Garza MRN: [id number] DOB:Aug 10, 1976, 47 y.o., female Today's Date: 11/01/2022  PCP: Valerie Roys, DO  REFERRING PROVIDER: Valerie Roys, DO   END OF SESSION:   PT End of Session - 11/01/22 1611     Visit Number 4    Number of Visits 17    Date for PT Re-Evaluation 12/05/22    Authorization Type [employer]    PT Start Time 1615    PT Stop Time 1655    PT Time Calculation (min) 40 min    Activity Tolerance Patient tolerated treatment well    Behavior During Therapy WFL for tasks assessed/performed               Past Medical History:  Diagnosis Date   Anemia    Anxiety    Arthritis    Depression    History of kidney stones    IFG (impaired fasting glucose)    Obesity    PONV (postoperative nausea and vomiting)    Tobacco abuse    Past Surgical History:  Procedure Laterality Date   BARIATRIC SURGERY     BREAST BIOPSY Right 03/22/2021   [place] bx of mass, venus marker, path pending   CESAREAN SECTION     x 2    CYSTOSCOPY N/A 06/18/2017   Procedure: CYSTOSCOPY;  Surgeon: [name], MD;  Location: [facility];  Service: Gynecology;  Laterality: N/A;   INTRAUTERINE DEVICE (IUD) INSERTION     feb 2014   KNEE ARTHROSCOPY Right    LAPAROSCOPIC HYSTERECTOMY N/A 06/18/2017   Procedure: HYSTERECTOMY TOTAL LAPAROSCOPIC;  Surgeon: [name], MD;  Location: [facility];  Service: Gynecology;  Laterality: N/A;   TOTAL ABDOMINAL HYSTERECTOMY     TUBAL LIGATION     UTERINE FIBROID EMBOLIZATION  01/09/2017   Triangular vascular   Patient Active Problem List   Diagnosis Date Noted   Vitamin D deficiency 05/02/2021   S/P laparoscopic hysterectomy 06/18/2017   Bradycardia, sinus 09/14/2015   Bariatric surgery status 09/14/2015   Asthma 09/14/2015   Anxiety and depression 09/14/2015   Tobacco abuse 09/14/2015   IFG (impaired fasting glucose) 09/14/2015    REFERRING DIAG: M54.50 (ICD-10-CM) -  Acute bilateral low back pain without sciatica    THERAPY DIAG:  Other low back pain  Muscle weakness (generalized)  Other abnormalities of gait and mobility  Rationale for Evaluation and Treatment Rehabilitation  PERTINENT HISTORY: Depression, Anxiety    PRECAUTIONS: None  SUBJECTIVE:  SUBJECTIVE STATEMENT:  Patient reports no current pain and reports HEP compliance.   PAIN:  Are you having pain?  Yes: NPRS scale: 0/10 Worst: 8/10 Pain location: lower back Pain description: dull ache Aggravating factors: bending, lifting Relieving factors: heat, ice, walking   OBJECTIVE: (objective measures completed at initial evaluation unless otherwise dated)   DIAGNOSTIC FINDINGS:  N/A   PATIENT SURVEYS:  FOTO: 49% function; 63% predicted    COGNITION: Overall cognitive status: Within functional limits for tasks assessed            SENSATION: WFL   MUSCLE LENGTH: Hamstrings: Right WNL deg; Left WNL deg Marcello Moores test: Right (+) deg; Left (-) deg   POSTURE: rounded shoulders and increased lumbar lordosis   PALPATION: TTP to bilateral lumbar paraspinals    LUMBAR ROM:    AROM eval  Flexion Decreased pain  Extension Increased pain  Right lateral flexion    Left lateral flexion    Right rotation reduced  Left rotation reduced   (Blank rows = not tested)   LOWER EXTREMITY MMT:     MMT Right eval Left eval  Hip flexion 4/5 4/5  Hip extension      Hip abduction 3+/5 3+/5  Hip adduction      Hip internal rotation      Hip external rotation      Knee flexion 5/5 5/5  Knee extension 5/5 5/5  Ankle dorsiflexion      Ankle plantarflexion      Ankle inversion      Ankle eversion       (Blank rows = not tested)   LUMBAR SPECIAL TESTS:  Straight leg raise test: Negative and Slump test:  Negative   FUNCTIONAL TESTS:  30 Second Sit to Stand: 4 reps with UE   GAIT: Distance walked: 25f Assistive device utilized: None Level of assistance: Complete Independence Comments: antalgic gait R   TREATMENT: [facility] Adult PT Treatment:                                                DATE: 11/01/2022 Therapeutic Exercise: Nustep level 5 x 5 mins Sidestepping RTB at ankles at counter x4 laps Standing hip extension RTB at ankles x10 BIL Palloff press 7# 2x10 BIL Supine clamshell RTB 2x10 PPT 5" hold 2x10 Bridges x6 (beginning to increase pain) LTR x10 BIL Open books x10 BIL  [facility] Adult PT Treatment:                                                DATE: 10/18/2022 Therapeutic Exercise: Nustep level 5 x 5 mins Sidestepping RTB at ankles at counter x4 laps Standing hip extension RTB at ankles 2x10 BIL Palloff press 7# 2x10 BIL Supine clamshell GTB 3x10 Supine marching GTB 2x10 BIL PPT 5" hold 2x10 Bridges 2x10 Modalities: IFC to lumbar paraspinals, output to 8, monitored and checked in with patient during for intensity modification, with cold pack, pt in prone x 8 mins  [facility] Adult PT Treatment:  DATE: 10/16/2022 Therapeutic Exercise: Nustep level 5 x 5 mins Sidestepping RTB at ankles at counter x4 laps Standing hip extension RTB at ankles 2x10 BIL Supine sciatic nerve glide x10 BIL Supine clamshell GTB 2x10 Supine marching GTB 2x10 BIL PPT 5" hold 2x10 Bridges 2x10 LTR x10 BIL Open books x10 BIL    PATIENT EDUCATION:  Education details: eval findings, FOTO, HEP, POC Person educated: Patient Education method: Explanation, Demonstration, and Handouts Education comprehension: verbalized understanding and returned demonstration   HOME EXERCISE PROGRAM: Access Code: [facility] URL: [email or web address] Date: 10/10/2022 Prepared by: Octavio Manns   Exercises - Seated Sciatic Tensioner  - 1 x daily - 7 x weekly  - 2 sets - 10 reps - Modified Thomas Stretch  - 1 x daily - 7 x weekly - 2 sets - 30 sec hold - Supine Posterior Pelvic Tilt  - 1 x daily - 7 x weekly - 2 sets - 10 reps - 3 sec hold - Supine Bridge  - 1 x daily - 7 x weekly - 2 sets - 10 reps - Hooklying Clamshell with Resistance  - 1 x daily - 7 x weekly - 3 sets - 10 reps - green band hold   ASSESSMENT:   CLINICAL IMPRESSION: Patient presents to PT reporting no current pain and that the worst her pain has been over the past week was a 5/10. She reports HEP compliance and also obtained a TENS unit which she states has been very helpful. Session today focused on core and proximal hip strengthening while monitoring patients pain throughout session to prevent a flare of acute pain in her lower back and ended session at a 3/10 pain. She is also interested in aquatic therapy. Patient was able to tolerate all prescribed exercises with no adverse effects. Patient continues to benefit from skilled PT services and should be progressed as able to improve functional independence.     OBJECTIVE IMPAIRMENTS: decreased activity tolerance, decreased endurance, decreased mobility, decreased ROM, decreased strength, and pain.    ACTIVITY LIMITATIONS: carrying, lifting, bending, and squatting   PARTICIPATION LIMITATIONS: shopping, community activity, occupation, and yard work   PERSONAL FACTORS: Fitness and 1-2 comorbidities: Depression, Anxiety   are also affecting patient's functional outcome.    REHAB POTENTIAL: Excellent   CLINICAL DECISION MAKING: Stable/uncomplicated   EVALUATION COMPLEXITY: Low     GOALS: Goals reviewed with patient? No   SHORT TERM GOALS: Target date: 10/31/2022   Pt will be compliant and knowledgeable with initial HEP for improved comfort and carryover Baseline: initial HEP given  Goal status: MET Pt reports HEP compliance 11/01/22   2.  Pt will self report lower back pain no greater than 6/10 for improved comfort and  functional ability Baseline: 8/10 at worst Goal status: MET Pt reports 5/10 at worst 11/01/22   LONG TERM GOALS: Target date: 12/05/2022   Pt will self report lower back pain no greater than 3/10 for improved comfort and functional ability Baseline: 8/10 at worst Goal status: INITIAL    2.  Pt will improve FOTO function score to no less than 63% as proxy for functional improvement Baseline: 49% function Goal status: INITIAL    3.  Pt will increase 30 Second Sit to Stand rep count to no less than 8 reps for improved balance, strength, and functional mobility Baseline: 4 reps with UE Goal status: INITIAL    4.  Pt will improve bilateral LE strength to no less than 5/5 for improved comfort and functional  mobility Baseline: see chart Goal status: INITIAL   PLAN:   PT FREQUENCY: 2x/week   PT DURATION: 8 weeks   PLANNED INTERVENTIONS: Therapeutic exercises, Therapeutic activity, Neuromuscular re-education, Balance training, Gait training, Patient/Family education, Self Care, Joint mobilization, Aquatic Therapy, Dry Needling, Electrical stimulation, Cryotherapy, Moist heat, Vasopneumatic device, Manual therapy, and Re-evaluation.   PLAN FOR NEXT SESSION: assess HEP response, core and hip strengthening     Margarette Canada, PTA 11/01/2022, 5:01 PM

## 2022-11-06 ENCOUNTER — Ambulatory Visit: Payer: BC Managed Care – PPO

## 2022-11-06 NOTE — Therapy (Incomplete)
OUTPATIENT PHYSICAL THERAPY TREATMENT NOTE   Patient Name: Haley Garza MRN: [id number] DOB:[birth year], 47 y.o., female Today's Date: 11/06/2022  PCP: Valerie Roys, DO  REFERRING PROVIDER: Valerie Roys, DO   END OF SESSION:       Past Medical History:  Diagnosis Date   Anemia    Anxiety    Arthritis    Depression    History of kidney stones    IFG (impaired fasting glucose)    Obesity    PONV (postoperative nausea and vomiting)    Tobacco abuse    Past Surgical History:  Procedure Laterality Date   BARIATRIC SURGERY     BREAST BIOPSY Right 03/22/2021   [place] bx of mass, venus marker, path pending   CESAREAN SECTION     x 2    CYSTOSCOPY N/A 06/18/2017   Procedure: CYSTOSCOPY;  Surgeon: [name], MD;  Location: [facility];  Service: Gynecology;  Laterality: N/A;   INTRAUTERINE DEVICE (IUD) INSERTION     feb 2014   KNEE ARTHROSCOPY Right    LAPAROSCOPIC HYSTERECTOMY N/A 06/18/2017   Procedure: HYSTERECTOMY TOTAL LAPAROSCOPIC;  Surgeon: [name], MD;  Location: [facility];  Service: Gynecology;  Laterality: N/A;   TOTAL ABDOMINAL HYSTERECTOMY     TUBAL LIGATION     UTERINE FIBROID EMBOLIZATION  01/09/2017   Triangular vascular   Patient Active Problem List   Diagnosis Date Noted   Vitamin D deficiency 05/02/2021   S/P laparoscopic hysterectomy 06/18/2017   Bradycardia, sinus 09/14/2015   Bariatric surgery status 09/14/2015   Asthma 09/14/2015   Anxiety and depression 09/14/2015   Tobacco abuse 09/14/2015   IFG (impaired fasting glucose) 09/14/2015    REFERRING DIAG: M54.50 (ICD-10-CM) - Acute bilateral low back pain without sciatica    THERAPY DIAG:  No diagnosis found.  Rationale for Evaluation and Treatment Rehabilitation  PERTINENT HISTORY: Depression, Anxiety    PRECAUTIONS: None  SUBJECTIVE:                                                                                                                                                                                       SUBJECTIVE STATEMENT:  ***  PAIN:  Are you having pain?  Yes: NPRS scale: 0/10 Worst: 8/10 Pain location: lower back Pain description: dull ache Aggravating factors: bending, lifting Relieving factors: heat, ice, walking   OBJECTIVE: (objective measures completed at initial evaluation unless otherwise dated)   DIAGNOSTIC FINDINGS:  N/A   PATIENT SURVEYS:  FOTO: 49% function; 63% predicted    COGNITION: Overall cognitive status: Within functional limits for tasks assessed  SENSATION: WFL   MUSCLE LENGTH: Hamstrings: Right WNL deg; Left WNL deg Marcello Moores test: Right (+) deg; Left (-) deg   POSTURE: rounded shoulders and increased lumbar lordosis   PALPATION: TTP to bilateral lumbar paraspinals    LUMBAR ROM:    AROM eval  Flexion Decreased pain  Extension Increased pain  Right lateral flexion    Left lateral flexion    Right rotation reduced  Left rotation reduced   (Blank rows = not tested)   LOWER EXTREMITY MMT:     MMT Right eval Left eval  Hip flexion 4/5 4/5  Hip extension      Hip abduction 3+/5 3+/5  Hip adduction      Hip internal rotation      Hip external rotation      Knee flexion 5/5 5/5  Knee extension 5/5 5/5  Ankle dorsiflexion      Ankle plantarflexion      Ankle inversion      Ankle eversion       (Blank rows = not tested)   LUMBAR SPECIAL TESTS:  Straight leg raise test: Negative and Slump test: Negative   FUNCTIONAL TESTS:  30 Second Sit to Stand: 4 reps with UE   GAIT: Distance walked: 78f Assistive device utilized: None Level of assistance: Complete Independence Comments: antalgic gait R   TREATMENT: [facility] Adult PT Treatment:                                                DATE: 11/06/2022 Therapeutic Exercise: Nustep level 5 x 5 mins Sidestepping RTB at ankles at counter x4 laps Standing hip extension RTB at ankles x10 BIL Palloff press 7# 2x10 BIL Supine  clamshell RTB 2x10 PPT 5" hold 2x10 Bridges x6 (beginning to increase pain) LTR x10 BIL Open books x10 BIL  [facility] Adult PT Treatment:                                                DATE: 11/01/2022 Therapeutic Exercise: Nustep level 5 x 5 mins Sidestepping RTB at ankles at counter x4 laps Standing hip extension RTB at ankles x10 BIL Palloff press 7# 2x10 BIL Supine clamshell RTB 2x10 PPT 5" hold 2x10 Bridges x6 (beginning to increase pain) LTR x10 BIL Open books x10 BIL  [facility] Adult PT Treatment:                                                DATE: 10/18/2022 Therapeutic Exercise: Nustep level 5 x 5 mins Sidestepping RTB at ankles at counter x4 laps Standing hip extension RTB at ankles 2x10 BIL Palloff press 7# 2x10 BIL Supine clamshell GTB 3x10 Supine marching GTB 2x10 BIL PPT 5" hold 2x10 Bridges 2x10 Modalities: IFC to lumbar paraspinals, output to 8, monitored and checked in with patient during for intensity modification, with cold pack, pt in prone x 8 mins  [facility] Adult PT Treatment:  DATE: 10/16/2022 Therapeutic Exercise: Nustep level 5 x 5 mins Sidestepping RTB at ankles at counter x4 laps Standing hip extension RTB at ankles 2x10 BIL Supine sciatic nerve glide x10 BIL Supine clamshell GTB 2x10 Supine marching GTB 2x10 BIL PPT 5" hold 2x10 Bridges 2x10 LTR x10 BIL Open books x10 BIL    PATIENT EDUCATION:  Education details: eval findings, FOTO, HEP, POC Person educated: Patient Education method: Explanation, Demonstration, and Handouts Education comprehension: verbalized understanding and returned demonstration   HOME EXERCISE PROGRAM: Access Code: [facility] URL: [email or web address] Date: 10/10/2022 Prepared by: Octavio Manns   Exercises - Seated Sciatic Tensioner  - 1 x daily - 7 x weekly - 2 sets - 10 reps - Modified Thomas Stretch  - 1 x daily - 7 x weekly - 2 sets - 30 sec hold - Supine  Posterior Pelvic Tilt  - 1 x daily - 7 x weekly - 2 sets - 10 reps - 3 sec hold - Supine Bridge  - 1 x daily - 7 x weekly - 2 sets - 10 reps - Hooklying Clamshell with Resistance  - 1 x daily - 7 x weekly - 3 sets - 10 reps - green band hold   ASSESSMENT:   CLINICAL IMPRESSION: ***    OBJECTIVE IMPAIRMENTS: decreased activity tolerance, decreased endurance, decreased mobility, decreased ROM, decreased strength, and pain.    ACTIVITY LIMITATIONS: carrying, lifting, bending, and squatting   PARTICIPATION LIMITATIONS: shopping, community activity, occupation, and yard work   PERSONAL FACTORS: Fitness and 1-2 comorbidities: Depression, Anxiety   are also affecting patient's functional outcome.    REHAB POTENTIAL: Excellent   CLINICAL DECISION MAKING: Stable/uncomplicated   EVALUATION COMPLEXITY: Low     GOALS: Goals reviewed with patient? No   SHORT TERM GOALS: Target date: 10/31/2022   Pt will be compliant and knowledgeable with initial HEP for improved comfort and carryover Baseline: initial HEP given  Goal status: MET Pt reports HEP compliance 11/01/22   2.  Pt will self report lower back pain no greater than 6/10 for improved comfort and functional ability Baseline: 8/10 at worst Goal status: MET Pt reports 5/10 at worst 11/01/22   LONG TERM GOALS: Target date: 12/05/2022   Pt will self report lower back pain no greater than 3/10 for improved comfort and functional ability Baseline: 8/10 at worst Goal status: INITIAL    2.  Pt will improve FOTO function score to no less than 63% as proxy for functional improvement Baseline: 49% function Goal status: INITIAL    3.  Pt will increase 30 Second Sit to Stand rep count to no less than 8 reps for improved balance, strength, and functional mobility Baseline: 4 reps with UE Goal status: INITIAL    4.  Pt will improve bilateral LE strength to no less than 5/5 for improved comfort and functional mobility Baseline: see chart Goal  status: INITIAL   PLAN:   PT FREQUENCY: 2x/week   PT DURATION: 8 weeks   PLANNED INTERVENTIONS: Therapeutic exercises, Therapeutic activity, Neuromuscular re-education, Balance training, Gait training, Patient/Family education, Self Care, Joint mobilization, Aquatic Therapy, Dry Needling, Electrical stimulation, Cryotherapy, Moist heat, Vasopneumatic device, Manual therapy, and Re-evaluation.   PLAN FOR NEXT SESSION: assess HEP response, core and hip strengthening     Ward Chatters, PT 11/06/2022, 7:44 AM

## 2022-11-08 ENCOUNTER — Ambulatory Visit: Payer: BC Managed Care – PPO

## 2022-11-08 NOTE — Therapy (Addendum)
OUTPATIENT PHYSICAL THERAPY TREATMENT NOTE/DISCHARGE  PHYSICAL THERAPY DISCHARGE SUMMARY  Visits from Start of Care: 5  Current functional level related to goals / functional outcomes: Unable to assess   Remaining deficits: Unable to assess   Education / Equipment: HEP   Patient agrees to discharge. Patient goals were  unable to assess . Patient is being discharged due to not returning since the last visit.   Patient Name: Haley Garza MRN: [id number] DOB:[birth year], 47 y.o., female Today's Date: 11/09/2022  PCP: Valerie Roys, DO  REFERRING PROVIDER: Valerie Roys, DO   END OF SESSION:   PT End of Session - 11/09/22 1548     Visit Number 5    Number of Visits 17    Date for PT Re-Evaluation 12/05/22    Authorization Type [employer]    PT Start Time 1600    PT Stop Time 1645    PT Time Calculation (min) 45 min    Activity Tolerance Patient tolerated treatment well    Behavior During Therapy WFL for tasks assessed/performed              Past Medical History:  Diagnosis Date   Anemia    Anxiety    Arthritis    Depression    History of kidney stones    IFG (impaired fasting glucose)    Obesity    PONV (postoperative nausea and vomiting)    Tobacco abuse    Past Surgical History:  Procedure Laterality Date   BARIATRIC SURGERY     BREAST BIOPSY Right 03/22/2021   [place] bx of mass, venus marker, path pending   CESAREAN SECTION     x 2    CYSTOSCOPY N/A 06/18/2017   Procedure: CYSTOSCOPY;  Surgeon: [name], MD;  Location: [facility];  Service: Gynecology;  Laterality: N/A;   INTRAUTERINE DEVICE (IUD) INSERTION     feb 2014   KNEE ARTHROSCOPY Right    LAPAROSCOPIC HYSTERECTOMY N/A 06/18/2017   Procedure: HYSTERECTOMY TOTAL LAPAROSCOPIC;  Surgeon: [name], MD;  Location: [facility];  Service: Gynecology;  Laterality: N/A;   TOTAL ABDOMINAL HYSTERECTOMY     TUBAL LIGATION     UTERINE FIBROID EMBOLIZATION  01/09/2017   Triangular vascular    Patient Active Problem List   Diagnosis Date Noted   Vitamin D deficiency 05/02/2021   S/P laparoscopic hysterectomy 06/18/2017   Bradycardia, sinus 09/14/2015   Bariatric surgery status 09/14/2015   Asthma 09/14/2015   Anxiety and depression 09/14/2015   Tobacco abuse 09/14/2015   IFG (impaired fasting glucose) 09/14/2015    REFERRING DIAG: M54.50 (ICD-10-CM) - Acute bilateral low back pain without sciatica    THERAPY DIAG:  Other low back pain  Muscle weakness (generalized)  Other abnormalities of gait and mobility  Rationale for Evaluation and Treatment Rehabilitation  PERTINENT HISTORY: Depression, Anxiety    PRECAUTIONS: None  SUBJECTIVE:  SUBJECTIVE STATEMENT:  Patient reports her back is feeling ok today and that her HEP is getting easier the more she does it.  PAIN:  Are you having pain?  Yes: NPRS scale: 1/10 Worst: 8/10 Pain location: lower back Pain description: dull ache Aggravating factors: bending, lifting Relieving factors: heat, ice, walking   OBJECTIVE: (objective measures completed at initial evaluation unless otherwise dated)   DIAGNOSTIC FINDINGS:  N/A   PATIENT SURVEYS:  FOTO: 49% function; 63% predicted    COGNITION: Overall cognitive status: Within functional limits for tasks assessed            SENSATION: WFL   MUSCLE LENGTH: Hamstrings: Right WNL deg; Left WNL deg Marcello Moores test: Right (+) deg; Left (-) deg   POSTURE: rounded shoulders and increased lumbar lordosis   PALPATION: TTP to bilateral lumbar paraspinals    LUMBAR ROM:    AROM eval  Flexion Decreased pain  Extension Increased pain  Right lateral flexion    Left lateral flexion    Right rotation reduced  Left rotation reduced   (Blank rows = not tested)   LOWER EXTREMITY MMT:      MMT Right eval Left eval  Hip flexion 4/5 4/5  Hip extension      Hip abduction 3+/5 3+/5  Hip adduction      Hip internal rotation      Hip external rotation      Knee flexion 5/5 5/5  Knee extension 5/5 5/5  Ankle dorsiflexion      Ankle plantarflexion      Ankle inversion      Ankle eversion       (Blank rows = not tested)   LUMBAR SPECIAL TESTS:  Straight leg raise test: Negative and Slump test: Negative   FUNCTIONAL TESTS:  30 Second Sit to Stand: 4 reps with UE   GAIT: Distance walked: 1f Assistive device utilized: None Level of assistance: Complete Independence Comments: antalgic gait R   TREATMENT: [facility] Adult PT Treatment:                                                DATE: 11/09/22 [address] - lap pool temp  approximately 87 degrees Pt enters building ambulating independently.  Treatment took place in water 3.8 to  4 ft 8 in.feet deep depending upon activity.  Pt entered and exited the pool via stair and handrails independently.   Patient entered water for aquatic therapy for first time and was introduced to principles and therapeutic effects of water as they ambulated and acclimated to pool.  Therapeutic Exercise: Walking forward/backwards/side stepping Sidestepping rainbow DB shoulder ab/adduction x2 laps Marching walk forwards x2 laps Runners stretch on bottom step x30" BIL Standing ITB stretch x30" BIL Standing thoracic rotation with noodle x1' BIL Cat/cow with noodle x20 Pink bell shoulder flexion/extension, horizontal ab/adduction At edge of pool, pt performed LE exercise: Hip abd/add x20 BIL Hip ext/flex with knee straight x 20 BIL Hip Circles CC/CCW x10 each BIL Marching hip flexion to knee extension 2x10 BIL Squats 2x20  Pt requires the buoyancy of water for active assisted exercises with buoyancy supported for strengthening and AROM exercises. Hydrostatic pressure also supports joints by unweighting  joint load by at least 50 % in 3-4 feet depth water. 80% in chest to neck deep water.  Water will provide assistance with movement using the current and laminar flow while the buoyancy reduces weight bearing. Pt requires the viscosity of the water for resistance with strengthening exercises.   [facility] Adult PT Treatment:                                                DATE: 11/01/2022 Therapeutic Exercise: Nustep level 5 x 5 mins Sidestepping RTB at ankles at counter x4 laps Standing hip extension RTB at ankles x10 BIL Palloff press 7# 2x10 BIL Supine clamshell RTB 2x10 PPT 5" hold 2x10 Bridges x6 (beginning to increase pain) LTR x10 BIL Open books x10 BIL  [facility] Adult PT Treatment:                                                DATE: 10/18/2022 Therapeutic Exercise: Nustep level 5 x 5 mins Sidestepping RTB at ankles at counter x4 laps Standing hip extension RTB at ankles 2x10 BIL Palloff press 7# 2x10 BIL Supine clamshell GTB 3x10 Supine marching GTB 2x10 BIL PPT 5" hold 2x10 Bridges 2x10 Modalities: IFC to lumbar paraspinals, output to 8, monitored and checked in with patient during for intensity modification, with cold pack, pt in prone x 8 mins    PATIENT EDUCATION:  Education details: eval findings, FOTO, HEP, POC Person educated: Patient Education method: Explanation, Demonstration, and Handouts Education comprehension: verbalized understanding and returned demonstration   HOME EXERCISE PROGRAM: Access Code: [facility] URL: [email or web address] Date: 10/10/2022 Prepared by: Octavio Manns   Exercises - Seated Sciatic Tensioner  - 1 x daily - 7 x weekly - 2 sets - 10 reps - Modified Thomas Stretch  - 1 x daily - 7 x weekly - 2 sets - 30 sec hold - Supine Posterior Pelvic Tilt  - 1 x daily - 7 x weekly - 2 sets - 10 reps - 3 sec hold - Supine Bridge  - 1 x daily - 7 x weekly - 2 sets - 10 reps - Hooklying Clamshell with Resistance  - 1 x daily - 7 x weekly - 3  sets - 10 reps - green band hold   ASSESSMENT:   CLINICAL IMPRESSION: Patient presents to aquatic PT session reporting improved pain in her lower back and reports that her home exercises have been getting easier. Session today focused on BIL LE, proximal hip, and core strengthening in the aquatic environment for use of buoyancy to offload joints and the viscosity of water as resistance during therapeutic exercise. Patient was able to tolerate all prescribed exercises in the aquatic environment with no adverse effects. Patient continues to benefit from skilled PT services on land and aquatic based and should be progressed as able to improve functional independence.    OBJECTIVE IMPAIRMENTS: decreased activity tolerance, decreased endurance, decreased mobility, decreased ROM, decreased strength, and pain.    ACTIVITY LIMITATIONS: carrying, lifting, bending, and squatting   PARTICIPATION LIMITATIONS: shopping, community activity, occupation, and yard work   PERSONAL FACTORS: Fitness and 1-2 comorbidities: Depression, Anxiety   are also affecting patient's functional outcome.    REHAB POTENTIAL: Excellent   CLINICAL DECISION MAKING: Stable/uncomplicated   EVALUATION COMPLEXITY: Low     GOALS: Goals reviewed with patient? No  SHORT TERM GOALS: Target date: 10/31/2022   Pt will be compliant and knowledgeable with initial HEP for improved comfort and carryover Baseline: initial HEP given  Goal status: MET Pt reports HEP compliance 11/01/22   2.  Pt will self report lower back pain no greater than 6/10 for improved comfort and functional ability Baseline: 8/10 at worst Goal status: MET Pt reports 5/10 at worst 11/01/22   LONG TERM GOALS: Target date: 12/05/2022   Pt will self report lower back pain no greater than 3/10 for improved comfort and functional ability Baseline: 8/10 at worst Goal status: INITIAL    2.  Pt will improve FOTO function score to no less than 63% as proxy for  functional improvement Baseline: 49% function Goal status: INITIAL    3.  Pt will increase 30 Second Sit to Stand rep count to no less than 8 reps for improved balance, strength, and functional mobility Baseline: 4 reps with UE Goal status: INITIAL    4.  Pt will improve bilateral LE strength to no less than 5/5 for improved comfort and functional mobility Baseline: see chart Goal status: INITIAL   PLAN:   PT FREQUENCY: 2x/week   PT DURATION: 8 weeks   PLANNED INTERVENTIONS: Therapeutic exercises, Therapeutic activity, Neuromuscular re-education, Balance training, Gait training, Patient/Family education, Self Care, Joint mobilization, Aquatic Therapy, Dry Needling, Electrical stimulation, Cryotherapy, Moist heat, Vasopneumatic device, Manual therapy, and Re-evaluation.   PLAN FOR NEXT SESSION: assess HEP response, core and hip strengthening     Margarette Canada, PTA 11/09/2022, 3:48 PM

## 2022-11-09 ENCOUNTER — Ambulatory Visit: Payer: BC Managed Care – PPO

## 2022-11-09 DIAGNOSIS — R2689 Other abnormalities of gait and mobility: Secondary | ICD-10-CM

## 2022-11-09 DIAGNOSIS — M5459 Other low back pain: Secondary | ICD-10-CM

## 2022-11-09 DIAGNOSIS — M6281 Muscle weakness (generalized): Secondary | ICD-10-CM

## 2022-11-13 ENCOUNTER — Ambulatory Visit: Payer: BC Managed Care – PPO

## 2022-11-13 NOTE — Therapy (Incomplete)
OUTPATIENT PHYSICAL THERAPY TREATMENT NOTE   Patient Name: Haley Garza MRN: [id number] DOB:1976/07/23, 47 y.o., female Today's Date: 11/13/2022  PCP: Valerie Roys, DO  REFERRING PROVIDER: Valerie Roys, DO   END OF SESSION:      Past Medical History:  Diagnosis Date   Anemia    Anxiety    Arthritis    Depression    History of kidney stones    IFG (impaired fasting glucose)    Obesity    PONV (postoperative nausea and vomiting)    Tobacco abuse    Past Surgical History:  Procedure Laterality Date   BARIATRIC SURGERY     BREAST BIOPSY Right 03/22/2021   [place] bx of mass, venus marker, path pending   CESAREAN SECTION     x 2    CYSTOSCOPY N/A 06/18/2017   Procedure: CYSTOSCOPY;  Surgeon: [name], MD;  Location: [facility];  Service: Gynecology;  Laterality: N/A;   INTRAUTERINE DEVICE (IUD) INSERTION     feb 2014   KNEE ARTHROSCOPY Right    LAPAROSCOPIC HYSTERECTOMY N/A 06/18/2017   Procedure: HYSTERECTOMY TOTAL LAPAROSCOPIC;  Surgeon: [name], MD;  Location: [facility] OR[facility];  Service: Gynecology;  Laterality: N/A;   TOTAL ABDOMINAL HYSTERECTOMY     TUBAL LIGATION     UTERINE FIBROID EMBOLIZATION  01/09/2017   Triangular vascular   Patient Active Problem List   Diagnosis Date Noted   Vitamin D deficiency 05/02/2021   S/P laparoscopic hysterectomy 06/18/2017   Bradycardia, sinus 09/14/2015   Bariatric surgery status 09/14/2015   Asthma 09/14/2015   Anxiety and depression 09/14/2015   Tobacco abuse 09/14/2015   IFG (impaired fasting glucose) 09/14/2015    REFERRING DIAG: M54.50 (ICD-10-CM) - Acute bilateral low back pain without sciatica    THERAPY DIAG:  No diagnosis found.  Rationale for Evaluation and Treatment Rehabilitation  PERTINENT HISTORY: Depression, Anxiety    PRECAUTIONS: None  SUBJECTIVE:                                                                                                                                                                                       SUBJECTIVE STATEMENT:  ***  PAIN:  Are you having pain?  Yes: NPRS scale: 1/10 Worst: 8/10 Pain location: lower back Pain description: dull ache Aggravating factors: bending, lifting Relieving factors: heat, ice, walking   OBJECTIVE: (objective measures completed at initial evaluation unless otherwise dated)   DIAGNOSTIC FINDINGS:  N/A   PATIENT SURVEYS:  FOTO: 49% function; 63% predicted    COGNITION: Overall cognitive status: Within functional limits for tasks assessed            SENSATION:  WFL   MUSCLE LENGTH: Hamstrings: Right WNL deg; Left WNL deg Marcello Moores test: Right (+) deg; Left (-) deg   POSTURE: rounded shoulders and increased lumbar lordosis   PALPATION: TTP to bilateral lumbar paraspinals    LUMBAR ROM:    AROM eval  Flexion Decreased pain  Extension Increased pain  Right lateral flexion    Left lateral flexion    Right rotation reduced  Left rotation reduced   (Blank rows = not tested)   LOWER EXTREMITY MMT:     MMT Right eval Left eval  Hip flexion 4/5 4/5  Hip extension      Hip abduction 3+/5 3+/5  Hip adduction      Hip internal rotation      Hip external rotation      Knee flexion 5/5 5/5  Knee extension 5/5 5/5  Ankle dorsiflexion      Ankle plantarflexion      Ankle inversion      Ankle eversion       (Blank rows = not tested)   LUMBAR SPECIAL TESTS:  Straight leg raise test: Negative and Slump test: Negative   FUNCTIONAL TESTS:  30 Second Sit to Stand: 4 reps with UE   GAIT: Distance walked: 20f Assistive device utilized: None Level of assistance: Complete Independence Comments: antalgic gait R   TREATMENT: [facility] Adult PT Treatment:                                                DATE: 11/09/22 [address] - lap pool temp  approximately 87 degrees Pt enters building ambulating independently.  Treatment took place in water 3.8 to  4 ft 8  in.feet deep depending upon activity.  Pt entered and exited the pool via stair and handrails independently.   Patient entered water for aquatic therapy for first time and was introduced to principles and therapeutic effects of water as they ambulated and acclimated to pool.  Therapeutic Exercise: Walking forward/backwards/side stepping Sidestepping rainbow DB shoulder ab/adduction x2 laps Marching walk forwards x2 laps Runners stretch on bottom step x30" BIL Standing ITB stretch x30" BIL Standing thoracic rotation with noodle x1' BIL Cat/cow with noodle x20 Pink bell shoulder flexion/extension, horizontal ab/adduction At edge of pool, pt performed LE exercise: Hip abd/add x20 BIL Hip ext/flex with knee straight x 20 BIL Hip Circles CC/CCW x10 each BIL Marching hip flexion to knee extension 2x10 BIL Squats 2x20  Pt requires the buoyancy of water for active assisted exercises with buoyancy supported for strengthening and AROM exercises. Hydrostatic pressure also supports joints by unweighting joint load by at least 50 % in 3-4 feet depth water. 80% in chest to neck deep water. Water will provide assistance with movement using the current and laminar flow while the buoyancy reduces weight bearing. Pt requires the viscosity of the water for resistance with strengthening exercises.   [facility]                                                [facility]: [facility] Therapeutic Exercise: Nustep level 5 x 5 mins Sidestepping RTB at ankles at counter x4 laps Standing hip extension RTB at ankles x10  BIL Palloff press 7# 2x10 BIL Supine clamshell RTB 2x10 PPT 5" hold 2x10 Bridges x6 (beginning to increase pain) LTR x10 BIL Open books x10 BIL  [facility] Adult PT Treatment:                                                DATE: 10/18/2022 Therapeutic Exercise: Nustep level 5 x 5 mins Sidestepping RTB at ankles at counter x4 laps Standing hip extension RTB at ankles 2x10 BIL Palloff press 7#  2x10 BIL Supine clamshell GTB 3x10 Supine marching GTB 2x10 BIL PPT 5" hold 2x10 Bridges 2x10 Modalities: IFC to lumbar paraspinals, output to 8, monitored and checked in with patient during for intensity modification, with cold pack, pt in prone x 8 mins    PATIENT EDUCATION:  Education details: eval findings, FOTO, HEP, POC Person educated: Patient Education method: Explanation, Demonstration, and Handouts Education comprehension: verbalized understanding and returned demonstration   HOME EXERCISE PROGRAM: Access Code: [facility] URL: [email or web address] Date: 10/10/2022 Prepared by: Octavio Manns   Exercises - Seated Sciatic Tensioner  - 1 x daily - 7 x weekly - 2 sets - 10 reps - Modified Thomas Stretch  - 1 x daily - 7 x weekly - 2 sets - 30 sec hold - Supine Posterior Pelvic Tilt  - 1 x daily - 7 x weekly - 2 sets - 10 reps - 3 sec hold - Supine Bridge  - 1 x daily - 7 x weekly - 2 sets - 10 reps - Hooklying Clamshell with Resistance  - 1 x daily - 7 x weekly - 3 sets - 10 reps - green band hold   ASSESSMENT:   CLINICAL IMPRESSION: ***    OBJECTIVE IMPAIRMENTS: decreased activity tolerance, decreased endurance, decreased mobility, decreased ROM, decreased strength, and pain.    ACTIVITY LIMITATIONS: carrying, lifting, bending, and squatting   PARTICIPATION LIMITATIONS: shopping, community activity, occupation, and yard work   PERSONAL FACTORS: Fitness and 1-2 comorbidities: Depression, Anxiety   are also affecting patient's functional outcome.    REHAB POTENTIAL: Excellent   CLINICAL DECISION MAKING: Stable/uncomplicated   EVALUATION COMPLEXITY: Low     GOALS: Goals reviewed with patient? No   SHORT TERM GOALS: Target date: 10/31/2022   Pt will be compliant and knowledgeable with initial HEP for improved comfort and carryover Baseline: initial HEP given  Goal status: MET Pt reports HEP compliance 11/01/22   2.  Pt will self report lower back  pain no greater than 6/10 for improved comfort and functional ability Baseline: 8/10 at worst Goal status: MET Pt reports 5/10 at worst 11/01/22   LONG TERM GOALS: Target date: 12/05/2022   Pt will self report lower back pain no greater than 3/10 for improved comfort and functional ability Baseline: 8/10 at worst Goal status: INITIAL    2.  Pt will improve FOTO function score to no less than 63% as proxy for functional improvement Baseline: 49% function Goal status: INITIAL    3.  Pt will increase 30 Second Sit to Stand rep count to no less than 8 reps for improved balance, strength, and functional mobility Baseline: 4 reps with UE Goal status: INITIAL    4.  Pt will improve bilateral LE strength to no less than 5/5 for improved comfort and functional mobility Baseline: see chart Goal status: INITIAL   PLAN:  PT FREQUENCY: 2x/week   PT DURATION: 8 weeks   PLANNED INTERVENTIONS: Therapeutic exercises, Therapeutic activity, Neuromuscular re-education, Balance training, Gait training, Patient/Family education, Self Care, Joint mobilization, Aquatic Therapy, Dry Needling, Electrical stimulation, Cryotherapy, Moist heat, Vasopneumatic device, Manual therapy, and Re-evaluation.   PLAN FOR NEXT SESSION: assess HEP response, core and hip strengthening     Ward Chatters, PT 11/13/2022, 9:45 AM

## 2022-11-15 ENCOUNTER — Ambulatory Visit: Payer: BC Managed Care – PPO

## 2022-11-22 IMAGING — CR DG LUMBAR SPINE 2-3V
3 series · 3 of 3 positions shown · non-contrast
Comparison: None Available.

CLINICAL DATA: Recent fall with low back pain, initial encounter

EXAM:
LUMBAR SPINE - 3 VIEW

[l-spine ap]
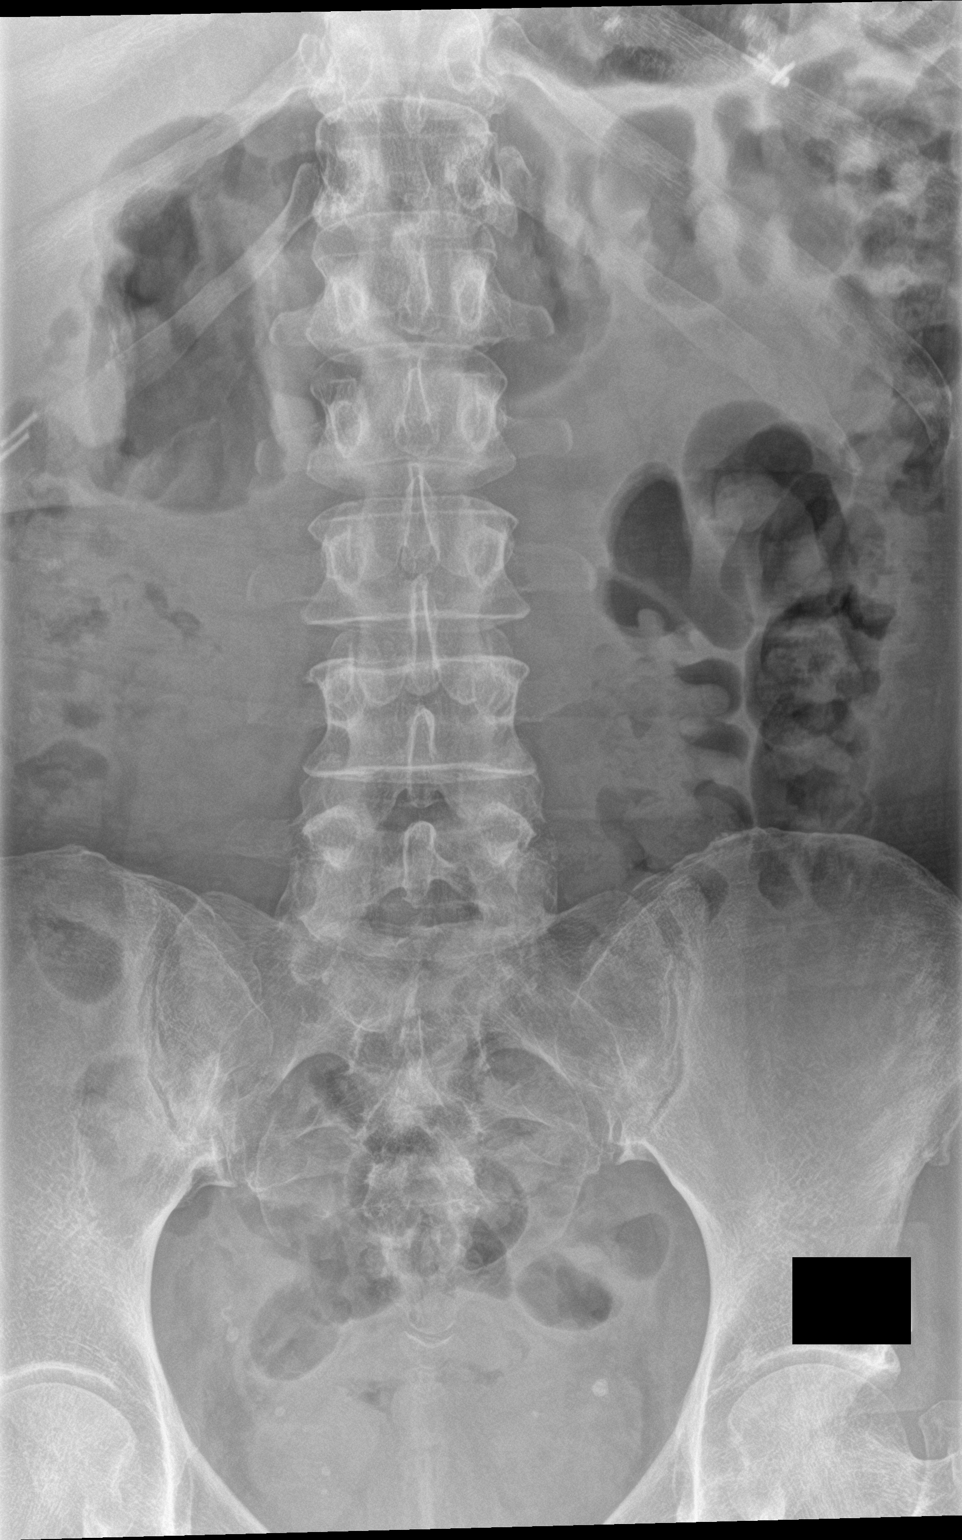

[l-spine lat]
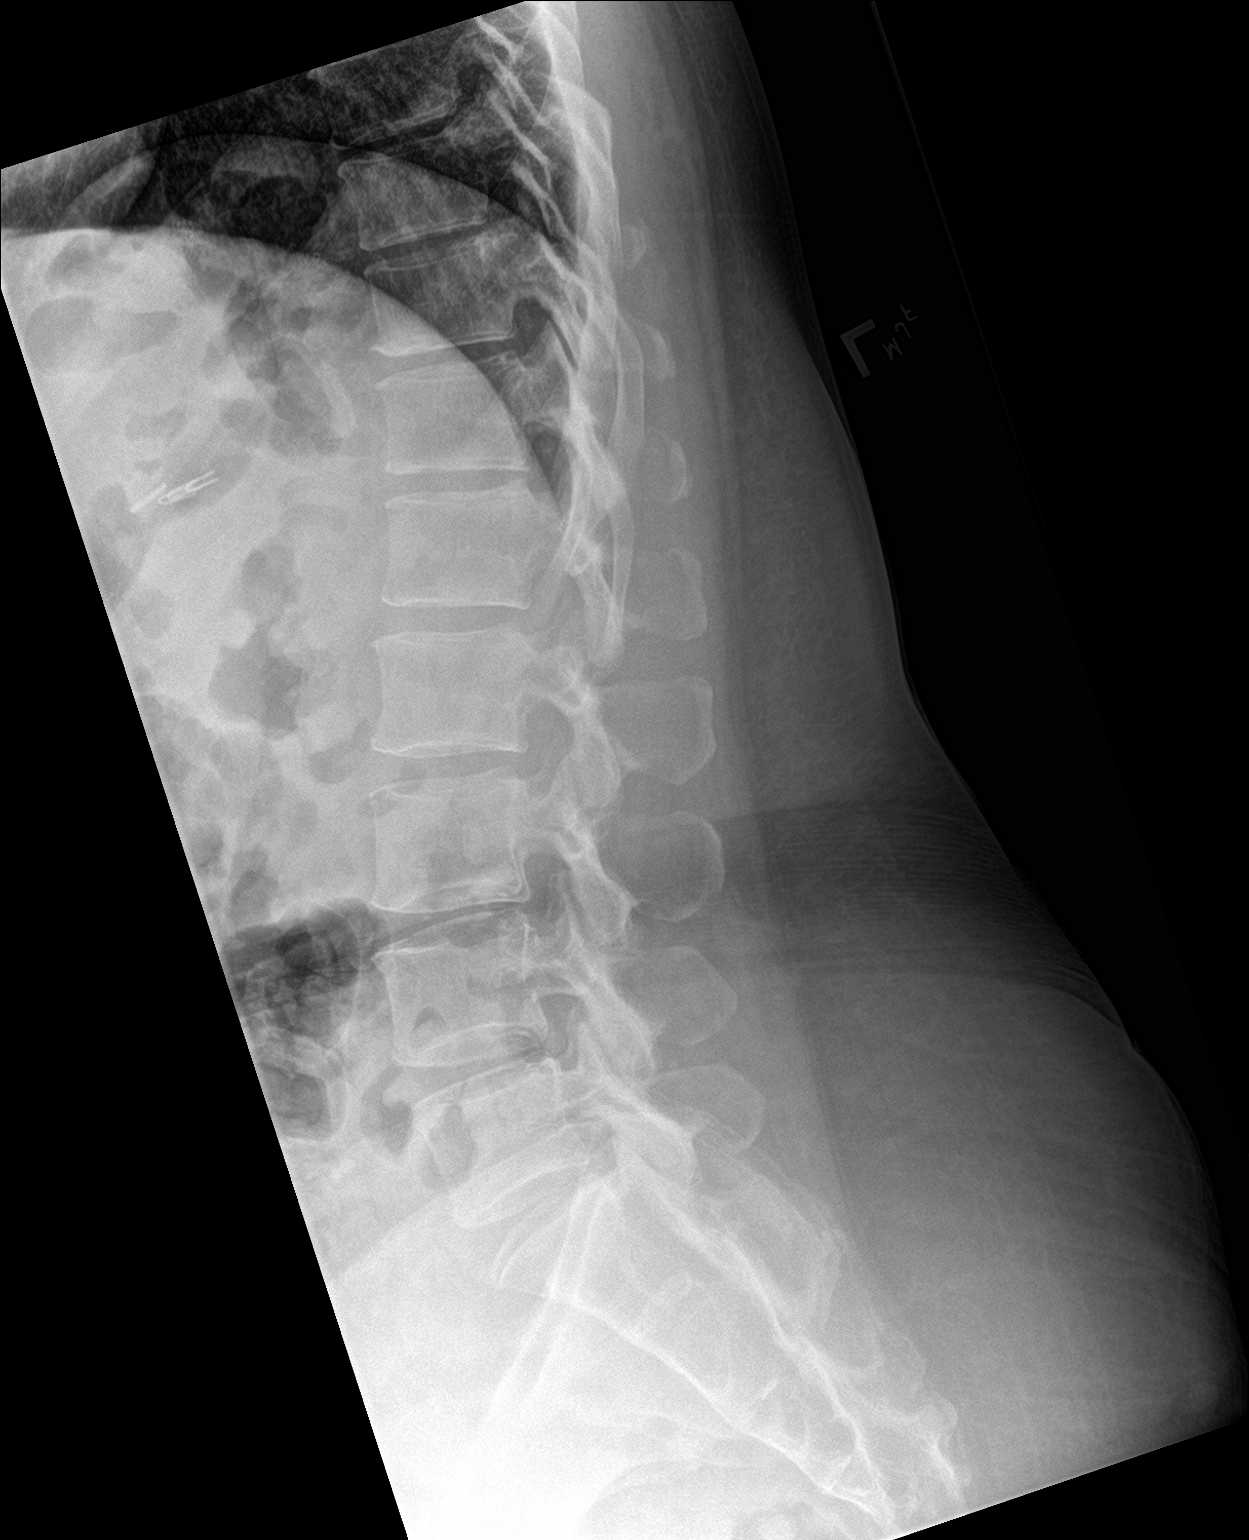

[l-spine spot]
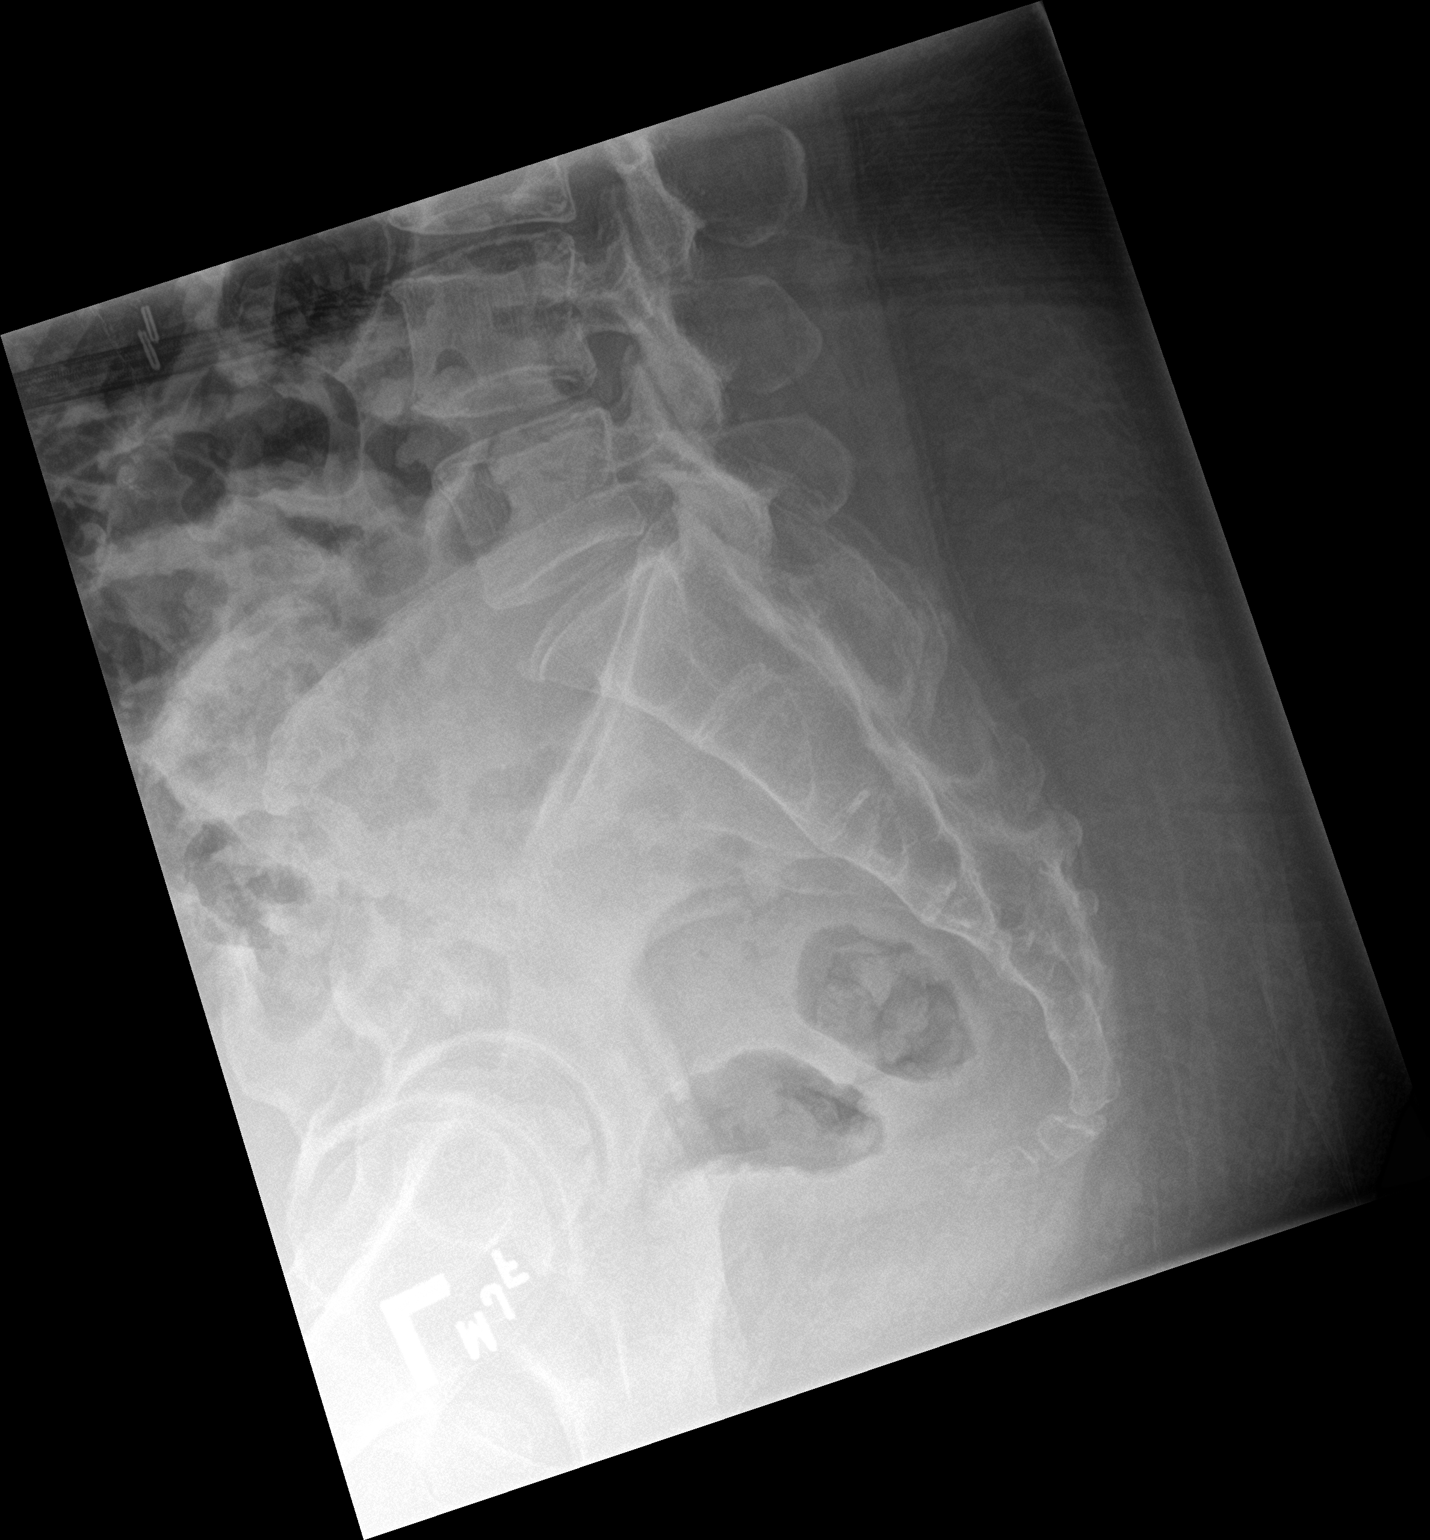

[3 of 3 positions shown; findings below may reference images not displayed]

FINDINGS: Five lumbar type vertebral bodies are well visualized. Vertebral
body height is well maintained. Minimal osteophytic changes are
noted at L3-4. No soft tissue abnormality is seen.
IMPRESSION: Mild degenerative change without acute abnormality.

## 2023-01-28 ENCOUNTER — Telehealth: Payer: Self-pay | Admitting: Family Medicine

## 2023-01-28 ENCOUNTER — Ambulatory Visit (LOCAL_COMMUNITY_HEALTH_CENTER): Payer: Self-pay

## 2023-01-28 DIAGNOSIS — Z111 Encounter for screening for respiratory tuberculosis: Secondary | ICD-10-CM

## 2023-01-28 NOTE — Telephone Encounter (Signed)
Has not had physical since 12/21/21. Would advise physical

## 2023-01-28 NOTE — Telephone Encounter (Signed)
Pt came to the office and dropped off a Staff Health Assessment/Medical Report to be completed by the provider.  Pt would like to be called once it has been completed.  Placed in providers folder in the back.

## 2023-01-29 NOTE — Telephone Encounter (Signed)
Scheduled for 01/31/2023 @ 4:00 to get form completed.

## 2023-01-31 ENCOUNTER — Ambulatory Visit (INDEPENDENT_AMBULATORY_CARE_PROVIDER_SITE_OTHER): Payer: BC Managed Care – PPO | Admitting: Family Medicine

## 2023-01-31 ENCOUNTER — Encounter: Payer: Self-pay | Admitting: Family Medicine

## 2023-01-31 ENCOUNTER — Ambulatory Visit: Payer: Self-pay

## 2023-01-31 VITALS — BP 108/71 | HR 53 | Temp 98.3°F | Ht 66.0 in | Wt 223.5 lb

## 2023-01-31 DIAGNOSIS — Z0000 Encounter for general adult medical examination without abnormal findings: Secondary | ICD-10-CM | POA: Diagnosis not present

## 2023-01-31 DIAGNOSIS — Z111 Encounter for screening for respiratory tuberculosis: Secondary | ICD-10-CM

## 2023-01-31 DIAGNOSIS — Z1211 Encounter for screening for malignant neoplasm of colon: Secondary | ICD-10-CM

## 2023-01-31 DIAGNOSIS — Z1231 Encounter for screening mammogram for malignant neoplasm of breast: Secondary | ICD-10-CM

## 2023-01-31 DIAGNOSIS — E538 Deficiency of other specified B group vitamins: Secondary | ICD-10-CM

## 2023-01-31 DIAGNOSIS — R7301 Impaired fasting glucose: Secondary | ICD-10-CM | POA: Diagnosis not present

## 2023-01-31 DIAGNOSIS — E559 Vitamin D deficiency, unspecified: Secondary | ICD-10-CM

## 2023-01-31 LAB — TB SKIN TEST
Induration: 0 mm
TB Skin Test: NEGATIVE

## 2023-01-31 NOTE — Progress Notes (Signed)
BP 108/71   Pulse (!) 53   Temp 98.3 F (36.8 C) (Oral)   Ht 5\' 6"  (1.676 m)   Wt 223 lb 8 oz (101.4 kg)   LMP 05/27/2017 Comment: preg test neg  SpO2 99%   BMI 36.07 kg/m    Subjective:    Patient ID: Haley Garza, female    DOB: [birth year], 47 y.o.   MRN: [id number]  HPI: Haley Garza is a 47 y.o. female presenting on 01/31/2023 for comprehensive medical examination. Current medical complaints include:  Impaired Fasting Glucose HbA1C:  Lab Results  Component Value Date   HGBA1C 5.0 12/21/2021   Duration of elevated blood sugar: chronic Polydipsia: no Polyuria: no Weight change: no Visual disturbance: no Glucose Monitoring: no    Accucheck frequency: Not Checking Diabetic Education: Not Completed Family history of diabetes: no  Menopausal Symptoms: no  Depression Screen done today and results listed below:     01/31/2023    3:55 PM 10/01/2022   10:42 AM 12/21/2021    1:56 PM 12/20/2020   10:59 AM 07/31/2019    1:02 PM  Depression screen PHQ 2/9  Decreased Interest 0 1 1 1  0  Down, Depressed, Hopeless 0 1 1 1  0  PHQ - 2 Score 0 2 2 2  0  Altered sleeping 1 1 1 3    Tired, decreased energy 1 1 3 3    Change in appetite 1 1 0 0   Feeling bad or failure about yourself  0 0 0 0   Trouble concentrating 0 0 0 1   Moving slowly or fidgety/restless 0 0 0 0   Suicidal thoughts 0 0 0 0   PHQ-9 Score 3 5 6 9    Difficult doing work/chores Not difficult at all Somewhat difficult  Somewhat difficult      Past Medical History:  Past Medical History:  Diagnosis Date   Anemia    Anxiety    Arthritis    Depression    History of kidney stones    IFG (impaired fasting glucose)    Obesity    PONV (postoperative nausea and vomiting)    Tobacco abuse     Surgical History:  Past Surgical History:  Procedure Laterality Date   BARIATRIC SURGERY     BREAST BIOPSY Right 03/22/2021   [place] bx of mass, venus marker, path pending   CESAREAN SECTION     x 2    CYSTOSCOPY  N/A 06/18/2017   Procedure: CYSTOSCOPY;  Surgeon: [name], MD;  Location: [facility];  Service: Gynecology;  Laterality: N/A;   INTRAUTERINE DEVICE (IUD) INSERTION     feb 2014   KNEE ARTHROSCOPY Right    LAPAROSCOPIC HYSTERECTOMY N/A 06/18/2017   Procedure: HYSTERECTOMY TOTAL LAPAROSCOPIC;  Surgeon: [name], MD;  Location: [facility];  Service: Gynecology;  Laterality: N/A;   TOTAL ABDOMINAL HYSTERECTOMY     TUBAL LIGATION     UTERINE FIBROID EMBOLIZATION  01/09/2017   Triangular vascular    Medications:  Current Outpatient Medications on File Prior to Visit  Medication Sig   acetaminophen (TYLENOL) 500 MG tablet Take 500 mg by mouth every 6 (six) hours as needed.   Ascorbic Acid (VITAMIN C) 1000 MG tablet Take 1,000 mg by mouth daily. Chewable   b complex vitamins tablet Take 1 tablet by mouth daily.   baclofen (LIORESAL) 10 MG tablet Take 20 mg by mouth at bedtime.   Biotin 5000 MCG CAPS Take 1 capsule by mouth daily.  Cholecalciferol (VITAMIN D3) 5000 units CAPS Take 5,000 Units by mouth daily.   ferrous sulfate 325 (65 FE) MG tablet Take 65 mg by mouth daily with breakfast.   fluticasone (FLONASE) 50 MCG/ACT nasal spray Place 1 spray into both nostrils daily. Begin by using 2 sprays in each nare daily for 3 to 5 days, then decrease to 1 spray in each nare daily.   meloxicam (MOBIC) 7.5 MG tablet Take 7.5 mg by mouth as needed for pain.   Multiple Vitamin (MULTIVITAMIN) tablet Take 1 tablet by mouth daily.   vitamin A 8000 UNIT capsule Take 8,000 Units by mouth daily.   vitamin E 200 UNIT capsule Take 200 Units by mouth daily.   VITAMIN K PO Take 100 mcg by mouth daily.   No current facility-administered medications on file prior to visit.    Allergies:  Allergies  Allergen Reactions   Honey Bee Treatment [Bee Venom] Itching and Swelling   Oxycodone-Acetaminophen Hives and Itching    Social History:  Social History   Socioeconomic History   Marital  status: Divorced    Spouse name: Not on file   Number of children: Not on file   Years of education: Not on file   Highest education level: Not on file  Occupational History   Not on file  Tobacco Use   Smoking status: Former    Packs/day: 0    Types: Cigarettes   Smokeless tobacco: Never   Tobacco comments:    a cigarrete every few days   Vaping Use   Vaping Use: Never used  Substance and Sexual Activity   Alcohol use: Yes    Comment: occ   Drug use: No   Sexual activity: Not Currently    Birth control/protection: None    Comment: Hysterectomy 2018  Other Topics Concern   Not on file  Social History Narrative   Not on file   Social Determinants of Health   Financial Resource Strain: Not on file  Food Insecurity: Not on file  Transportation Needs: Not on file  Physical Activity: Not on file  Stress: Not on file  Social Connections: Not on file  Intimate Partner Violence: Not on file   Social History   Tobacco Use  Smoking Status Former   Packs/day: 0   Types: Cigarettes  Smokeless Tobacco Never  Tobacco Comments   a cigarrete every few days    Social History   Substance and Sexual Activity  Alcohol Use Yes   Comment: occ    Family History:  Family History  Problem Relation Age of Onset   Hypertension Mother    Hyperlipidemia Mother    Depression Mother    Fibroids Mother    Breast cancer Neg Hx     Past medical history, surgical history, medications, allergies, family history and social history reviewed with patient today and changes made to appropriate areas of the chart.   Review of Systems  Constitutional: Negative.   HENT: Negative.    Eyes: Negative.   Respiratory: Negative.    Cardiovascular: Negative.   Gastrointestinal: Negative.   Genitourinary: Negative.   Musculoskeletal: Negative.   Skin: Negative.   Neurological: Negative.   Endo/Heme/Allergies:  Negative for environmental allergies and polydipsia. Bruises/bleeds easily.   Psychiatric/Behavioral: Negative.     All other ROS negative except what is listed above and in the HPI.      Objective:    BP 108/71   Pulse (!) 53   Temp 98.3 F (36.8  C) (Oral)   Ht 5\' 6"  (1.676 m)   Wt 223 lb 8 oz (101.4 kg)   LMP 05/27/2017 Comment: preg test neg  SpO2 99%   BMI 36.07 kg/m   Wt Readings from Last 3 Encounters:  01/31/23 223 lb 8 oz (101.4 kg)  10/01/22 218 lb 11.2 oz (99.2 kg)  03/15/22 200 lb 3.2 oz (90.8 kg)    Physical Exam Vitals and nursing note reviewed.  Constitutional:      General: She is not in acute distress.    Appearance: Normal appearance. She is obese. She is not ill-appearing, toxic-appearing or diaphoretic.  HENT:     Head: Normocephalic and atraumatic.     Right Ear: Tympanic membrane, ear canal and external ear normal. There is no impacted cerumen.     Left Ear: Tympanic membrane, ear canal and external ear normal. There is no impacted cerumen.     Nose: Nose normal. No congestion or rhinorrhea.     Mouth/Throat:     Mouth: Mucous membranes are moist.     Pharynx: Oropharynx is clear. No oropharyngeal exudate or posterior oropharyngeal erythema.  Eyes:     General: No scleral icterus.       Right eye: No discharge.        Left eye: No discharge.     Extraocular Movements: Extraocular movements intact.     Conjunctiva/sclera: Conjunctivae normal.     Pupils: Pupils are equal, round, and reactive to light.  Neck:     Vascular: No carotid bruit.  Cardiovascular:     Rate and Rhythm: Normal rate and regular rhythm.     Pulses: Normal pulses.     Heart sounds: No murmur heard.    No friction rub. No gallop.  Pulmonary:     Effort: Pulmonary effort is normal. No respiratory distress.     Breath sounds: Normal breath sounds. No stridor. No wheezing, rhonchi or rales.  Chest:     Chest wall: No tenderness.  Abdominal:     General: Abdomen is flat. Bowel sounds are normal. There is no distension.     Palpations: Abdomen is  soft. There is no mass.     Tenderness: There is no abdominal tenderness. There is no right CVA tenderness, left CVA tenderness, guarding or rebound.     Hernia: No hernia is present.  Genitourinary:    Comments: Breast and pelvic exams deferred with shared decision making Musculoskeletal:        General: No swelling, tenderness, deformity or signs of injury.     Cervical back: Normal range of motion and neck supple. No rigidity. No muscular tenderness.     Right lower leg: No edema.     Left lower leg: No edema.  Lymphadenopathy:     Cervical: No cervical adenopathy.  Skin:    General: Skin is warm and dry.     Capillary Refill: Capillary refill takes less than 2 seconds.     Coloration: Skin is not jaundiced or pale.     Findings: No bruising, erythema, lesion or rash.  Neurological:     General: No focal deficit present.     Mental Status: She is alert and oriented to person, place, and time. Mental status is at baseline.     Cranial Nerves: No cranial nerve deficit.     Sensory: No sensory deficit.     Motor: No weakness.     Coordination: Coordination normal.     Gait: Gait normal.  Deep Tendon Reflexes: Reflexes normal.  Psychiatric:        Mood and Affect: Mood normal.        Behavior: Behavior normal.        Thought Content: Thought content normal.        Judgment: Judgment normal.     Results for orders placed or performed in visit on 01/28/23  TB Skin Test  Result Value Ref Range   TB Skin Test Negative    Induration 0 mm      Assessment & Plan:   Problem List Items Addressed This Visit       Endocrine   IFG (impaired fasting glucose)    Labs drawn today. Await results. Treat as needed.       Relevant Orders   Bayer DCA Hb A1c Waived     Other   Vitamin D deficiency    Labs drawn today. Await results. Treat as needed.       Relevant Orders   VITAMIN D 25 Hydroxy (Vit-D Deficiency, Fractures)   Other Visit Diagnoses     Routine general  medical examination at a health care facility    -  Primary   Vaccines up to date. Screening labs checked today. Pap N/A. Mammo and Colonoscopy ordered today. Continue diet and exercise. Call with any concerns.   Relevant Orders   CBC with Differential/Platelet   Comprehensive metabolic panel   Lipid Panel w/o Chol/HDL Ratio   Urinalysis, Routine w reflex microscopic   TSH   VITAMIN D 25 Hydroxy (Vit-D Deficiency, Fractures)   Bayer DCA Hb A1c Waived   B12 deficiency       Labs drawn today. Await results. Treat as needed.   Relevant Orders   B12   Screening for colon cancer       Referral to GI placed today.   Relevant Orders   Ambulatory referral to Gastroenterology   Encounter for screening mammogram for malignant neoplasm of breast       Mammogram ordered today.   Relevant Orders   MM 3D SCREENING MAMMOGRAM BILATERAL BREAST        Follow up plan: Return in about 1 year (around 01/31/2024) for physical.   LABORATORY TESTING:  - Pap smear: not applicable  IMMUNIZATIONS:   - Tdap: Tetanus vaccination status reviewed: last tetanus booster within 10 years. - Influenza: Up to date - Pneumovax: Not applicable - Prevnar: Not applicable - COVID: Up to date - HPV: Not applicable - Shingrix vaccine: Not applicable  SCREENING: -Mammogram: Ordered today  - Colonoscopy: Ordered today   PATIENT COUNSELING:   Advised to take 1 mg of folate supplement per day if capable of pregnancy.   Sexuality: Discussed sexually transmitted diseases, partner selection, use of condoms, avoidance of unintended pregnancy  and contraceptive alternatives.   Advised to avoid cigarette smoking.  I discussed with the patient that most people either abstain from alcohol or drink within safe limits (<=14/week and <=4 drinks/occasion for males, <=7/weeks and <= 3 drinks/occasion for females) and that the risk for alcohol disorders and other health effects rises proportionally with the number of drinks  per week and how often a drinker exceeds daily limits.  Discussed cessation/primary prevention of drug use and availability of treatment for abuse.   Diet: Encouraged to adjust caloric intake to maintain  or achieve ideal body weight, to reduce intake of dietary saturated fat and total fat, to limit sodium intake by avoiding high sodium foods and not adding  table salt, and to maintain adequate dietary potassium and calcium preferably from fresh fruits, vegetables, and low-fat dairy products.    stressed the importance of regular exercise  Injury prevention: Discussed safety belts, safety helmets, smoke detector, smoking near bedding or upholstery.   Dental health: Discussed importance of regular tooth brushing, flossing, and dental visits.    NEXT PREVENTATIVE PHYSICAL DUE IN 1 YEAR. Return in about 1 year (around 01/31/2024) for physical.

## 2023-01-31 NOTE — Patient Instructions (Signed)
Please call to schedule your mammogram and/or bone density: [facility]  Address: [address], [place], [place] [address] Phone: [phone]  [facility] [place] [address] [place],  [place]  [address] Phone: [phone]   

## 2023-01-31 NOTE — Assessment & Plan Note (Signed)
Labs drawn today. Await results. Treat as needed.  

## 2023-01-31 NOTE — Progress Notes (Signed)
PPDR 0 MM negative. Mantoux slip signed and given to patient.  Servando Salina, RN

## 2023-02-01 LAB — CBC WITH DIFFERENTIAL/PLATELET
Basophils Absolute: 0 10*3/uL (ref 0.0–0.2)
Basos: 1 %
EOS (ABSOLUTE): 0.1 10*3/uL (ref 0.0–0.4)
Eos: 2 %
Hematocrit: 40 % (ref 34.0–46.6)
Hemoglobin: 13.3 g/dL (ref 11.1–15.9)
Immature Grans (Abs): 0 10*3/uL (ref 0.0–0.1)
Immature Granulocytes: 0 %
Lymphocytes Absolute: 2.1 10*3/uL (ref 0.7–3.1)
Lymphs: 33 %
MCH: 32.5 pg (ref 26.6–33.0)
MCHC: 33.3 g/dL (ref 31.5–35.7)
MCV: 98 fL — ABNORMAL HIGH (ref 79–97)
Monocytes Absolute: 0.5 10*3/uL (ref 0.1–0.9)
Monocytes: 8 %
Neutrophils Absolute: 3.6 10*3/uL (ref 1.4–7.0)
Neutrophils: 56 %
Platelets: 245 10*3/uL (ref 150–450)
RBC: 4.09 x10E6/uL (ref 3.77–5.28)
RDW: 12.2 % (ref 11.7–15.4)
WBC: 6.4 10*3/uL (ref 3.4–10.8)

## 2023-02-01 LAB — COMPREHENSIVE METABOLIC PANEL
ALT: 23 IU/L (ref 0–32)
AST: 15 IU/L (ref 0–40)
Albumin/Globulin Ratio: 1.8 (ref 1.2–2.2)
Albumin: 4.2 g/dL (ref 3.9–4.9)
Alkaline Phosphatase: 72 IU/L (ref 44–121)
BUN/Creatinine Ratio: 9 (ref 9–23)
BUN: 8 mg/dL (ref 6–24)
Bilirubin Total: 0.8 mg/dL (ref 0.0–1.2)
CO2: 21 mmol/L (ref 20–29)
Calcium: 8.9 mg/dL (ref 8.7–10.2)
Chloride: 103 mmol/L (ref 96–106)
Creatinine, Ser: 0.94 mg/dL (ref 0.57–1.00)
Globulin, Total: 2.4 g/dL (ref 1.5–4.5)
Glucose: 75 mg/dL (ref 70–99)
Potassium: 4.3 mmol/L (ref 3.5–5.2)
Sodium: 140 mmol/L (ref 134–144)
Total Protein: 6.6 g/dL (ref 6.0–8.5)
eGFR: 76 mL/min/{1.73_m2} (ref >59)

## 2023-02-01 LAB — MICROSCOPIC EXAMINATION
Bacteria, UA: NONE SEEN
WBC, UA: NONE SEEN /hpf (ref 0–5)

## 2023-02-01 LAB — URINALYSIS, ROUTINE W REFLEX MICROSCOPIC
Bilirubin, UA: NEGATIVE
Glucose, UA: NEGATIVE
Ketones, UA: NEGATIVE
Leukocytes,UA: NEGATIVE
Nitrite, UA: NEGATIVE
Protein,UA: NEGATIVE
Specific Gravity, UA: 1.01 (ref 1.005–1.030)
Urobilinogen, Ur: 0.2 mg/dL (ref 0.2–1.0)
pH, UA: 6 (ref 5.0–7.5)

## 2023-02-01 LAB — LIPID PANEL W/O CHOL/HDL RATIO
Cholesterol, Total: 153 mg/dL (ref 100–199)
HDL: 73 mg/dL (ref >39)
LDL Chol Calc (NIH): 68 mg/dL (ref 0–99)
Triglycerides: 61 mg/dL (ref 0–149)
VLDL Cholesterol Cal: 12 mg/dL (ref 5–40)

## 2023-02-01 LAB — VITAMIN D 25 HYDROXY (VIT D DEFICIENCY, FRACTURES): Vit D, 25-Hydroxy: 29.1 ng/mL — ABNORMAL LOW (ref 30.0–100.0)

## 2023-02-01 LAB — TSH: TSH: 1.41 u[IU]/mL (ref 0.450–4.500)

## 2023-02-01 LAB — BAYER DCA HB A1C WAIVED: HB A1C (BAYER DCA - WAIVED): 5 % (ref 4.8–5.6)

## 2023-02-01 LAB — VITAMIN B12: Vitamin B-12: 1644 pg/mL — ABNORMAL HIGH (ref 232–1245)

## 2023-02-04 ENCOUNTER — Telehealth: Payer: Self-pay

## 2023-02-04 ENCOUNTER — Other Ambulatory Visit: Payer: Self-pay

## 2023-02-04 DIAGNOSIS — Z1211 Encounter for screening for malignant neoplasm of colon: Secondary | ICD-10-CM

## 2023-02-04 MED ORDER — NA SULFATE-K SULFATE-MG SULF 17.5-3.13-1.6 GM/177ML PO SOLN: 1.0000 | Freq: Once | ORAL | 0 refills | Status: AC

## 2023-02-04 NOTE — Telephone Encounter (Signed)
Gastroenterology Pre-Procedure Review  Request Date: 04/11 Requesting Physician: Dr. Tobi Bastos  PATIENT REVIEW QUESTIONS: The patient responded to the following health history questions as indicated:    1. Are you having any GI issues? no 2. Do you have a personal history of Polyps? no 3. Do you have a family history of Colon Cancer or Polyps? no 4. Diabetes Mellitus? no 5. Joint replacements in the past 12 months?no 6. Major health problems in the past 3 months?no 7. Any artificial heart valves, MVP, or defibrillator?no    MEDICATIONS & ALLERGIES:    Patient reports the following regarding taking any anticoagulation/antiplatelet therapy:   Plavix, Coumadin, Eliquis, Xarelto, Lovenox, Pradaxa, Brilinta, or Effient? no Aspirin? no  Patient confirms/reports the following medications:  Current Outpatient Medications  Medication Sig Dispense Refill   Na Sulfate-K Sulfate-Mg Sulf 17.5-3.13-1.6 GM/177ML SOLN Take 1 kit by mouth once for 1 dose. 354 mL 0   acetaminophen (TYLENOL) 500 MG tablet Take 500 mg by mouth every 6 (six) hours as needed.     Ascorbic Acid (VITAMIN C) 1000 MG tablet Take 1,000 mg by mouth daily. Chewable     b complex vitamins tablet Take 1 tablet by mouth daily.     baclofen (LIORESAL) 10 MG tablet Take 20 mg by mouth at bedtime.     Biotin 5000 MCG CAPS Take 1 capsule by mouth daily.     Cholecalciferol (VITAMIN D3) 5000 units CAPS Take 5,000 Units by mouth daily.     ferrous sulfate 325 (65 FE) MG tablet Take 65 mg by mouth daily with breakfast.     fluticasone (FLONASE) 50 MCG/ACT nasal spray Place 1 spray into both nostrils daily. Begin by using 2 sprays in each nare daily for 3 to 5 days, then decrease to 1 spray in each nare daily. 15.8 mL 2   meloxicam (MOBIC) 7.5 MG tablet Take 7.5 mg by mouth as needed for pain.     Multiple Vitamin (MULTIVITAMIN) tablet Take 1 tablet by mouth daily.     vitamin A 8000 UNIT capsule Take 8,000 Units by mouth daily.     vitamin E  200 UNIT capsule Take 200 Units by mouth daily.     VITAMIN K PO Take 100 mcg by mouth daily.     No current facility-administered medications for this visit.    Patient confirms/reports the following allergies:  Allergies  Allergen Reactions   Honey Bee Treatment [Bee Venom] Itching and Swelling   Oxycodone-Acetaminophen Hives and Itching    No orders of the defined types were placed in this encounter.   AUTHORIZATION INFORMATION Primary Insurance: 1D#: Group #:  Secondary Insurance: 1D#: Group #:  SCHEDULE INFORMATION: Date: 02/07/23 Time: Location: [facility]

## 2023-02-06 ENCOUNTER — Encounter: Payer: Self-pay | Admitting: Gastroenterology

## 2023-02-07 ENCOUNTER — Encounter
Admission: RE | Disposition: A | Discharge status: Home / Self Care | Payer: Self-pay | Source: Home / Self Care | Attending: Gastroenterology

## 2023-02-07 ENCOUNTER — Ambulatory Visit: Payer: BC Managed Care – PPO | Admitting: Registered Nurse

## 2023-02-07 ENCOUNTER — Ambulatory Visit
Admission: RE | Admit: 2023-02-07 | Discharge: 2023-02-07 | Disposition: A | Discharge status: Home / Self Care | Payer: BC Managed Care – PPO | Attending: Gastroenterology | Admitting: Gastroenterology

## 2023-02-07 DIAGNOSIS — Z79899 Other long term (current) drug therapy: Secondary | ICD-10-CM | POA: Diagnosis not present

## 2023-02-07 DIAGNOSIS — F32A Depression, unspecified: Secondary | ICD-10-CM | POA: Insufficient documentation

## 2023-02-07 DIAGNOSIS — Z1211 Encounter for screening for malignant neoplasm of colon: Secondary | ICD-10-CM | POA: Diagnosis not present

## 2023-02-07 DIAGNOSIS — Z6833 Body mass index (BMI) 33.0-33.9, adult: Secondary | ICD-10-CM | POA: Diagnosis not present

## 2023-02-07 DIAGNOSIS — F419 Anxiety disorder, unspecified: Secondary | ICD-10-CM | POA: Insufficient documentation

## 2023-02-07 DIAGNOSIS — Z87891 Personal history of nicotine dependence: Secondary | ICD-10-CM | POA: Diagnosis not present

## 2023-02-07 DIAGNOSIS — E669 Obesity, unspecified: Secondary | ICD-10-CM | POA: Insufficient documentation

## 2023-02-07 DIAGNOSIS — J45909 Unspecified asthma, uncomplicated: Secondary | ICD-10-CM | POA: Insufficient documentation

## 2023-02-07 HISTORY — PX: COLONOSCOPY WITH PROPOFOL: SHX5780

## 2023-02-07 SURGERY — COLONOSCOPY WITH PROPOFOL
Anesthesia: General

## 2023-02-07 MED ORDER — SODIUM CHLORIDE 0.9 % IV SOLN: INTRAVENOUS | Status: DC

## 2023-02-07 MED ORDER — PROPOFOL 500 MG/50ML IV EMUL
INTRAVENOUS | Status: DC | PRN
  Administered 2023-02-07: 140 ug/kg/min via INTRAVENOUS

## 2023-02-07 MED ORDER — PROPOFOL 10 MG/ML IV BOLUS
INTRAVENOUS | Status: DC | PRN
  Administered 2023-02-07: 90 mg via INTRAVENOUS

## 2023-02-07 MED ORDER — LIDOCAINE HCL (CARDIAC) PF 100 MG/5ML IV SOSY
PREFILLED_SYRINGE | INTRAVENOUS | Status: DC | PRN
  Administered 2023-02-07: 60 mg via INTRAVENOUS

## 2023-02-07 NOTE — H&P (Signed)
Haley Mood, MD [address], [place], [place], [address] [address], [place], [place], [address] Phone: [phone]  Fax: [phone]  Primary Care Physician:  Dorcas Carrow, DO   Pre-Procedure History & Physical: HPI:  Haley Garza is a 47 y.o. female is here for an colonoscopy.   Past Medical History:  Diagnosis Date   Anemia    Anxiety    Arthritis    Depression    History of kidney stones    IFG (impaired fasting glucose)    Obesity    PONV (postoperative nausea and vomiting)    Tobacco abuse     Past Surgical History:  Procedure Laterality Date   BARIATRIC SURGERY     BREAST BIOPSY Right 03/22/2021   [place] bx of mass, venus marker, path pending   CESAREAN SECTION     x 2    CYSTOSCOPY N/A 06/18/2017   Procedure: CYSTOSCOPY;  Surgeon: Haley Austria, MD;  Location: [facility];  Service: Gynecology;  Laterality: N/A;   INTRAUTERINE DEVICE (IUD) INSERTION     feb 2014   KNEE ARTHROSCOPY Right    LAPAROSCOPIC HYSTERECTOMY N/A 06/18/2017   Procedure: HYSTERECTOMY TOTAL LAPAROSCOPIC;  Surgeon: Haley Austria, MD;  Location: [facility];  Service: Gynecology;  Laterality: N/A;   TOTAL ABDOMINAL HYSTERECTOMY     TUBAL LIGATION     UTERINE FIBROID EMBOLIZATION  01/09/2017   Triangular vascular    Prior to Admission medications   Medication Sig Start Date End Date Taking? Authorizing Provider  acetaminophen (TYLENOL) 500 MG tablet Take 500 mg by mouth every 6 (six) hours as needed.    [provider]  Ascorbic Acid (VITAMIN C) 1000 MG tablet Take 1,000 mg by mouth daily. Chewable    [provider]  b complex vitamins tablet Take 1 tablet by mouth daily.    [provider]  baclofen (LIORESAL) 10 MG tablet Take 20 mg by mouth at bedtime.    [provider]  Biotin 5000 MCG CAPS Take 1 capsule by mouth daily.    [provider]  Cholecalciferol (VITAMIN D3) 5000 units CAPS Take 5,000 Units  by mouth daily.    [provider]  ferrous sulfate 325 (65 FE) MG tablet Take 65 mg by mouth daily with breakfast.    [provider]  fluticasone (FLONASE) 50 MCG/ACT nasal spray Place 1 spray into both nostrils daily. Begin by using 2 sprays in each nare daily for 3 to 5 days, then decrease to 1 spray in each nare daily. 09/28/22   Theadora Rama Scales, PA-C  meloxicam (MOBIC) 7.5 MG tablet Take 7.5 mg by mouth as needed for pain.    [provider]  Multiple Vitamin (MULTIVITAMIN) tablet Take 1 tablet by mouth daily.    [provider]  vitamin A 8000 UNIT capsule Take 8,000 Units by mouth daily.    [provider]  vitamin E 200 UNIT capsule Take 200 Units by mouth daily.    [provider]  VITAMIN K PO Take 100 mcg by mouth daily.    [provider]    Allergies as of 02/04/2023 - Review Complete 01/31/2023  Allergen Reaction Noted   Honey bee treatment [bee venom] Itching and Swelling 06/12/2017   Oxycodone-acetaminophen Hives and Itching 09/14/2015    Family History  Problem Relation Age of Onset   Hypertension Mother    Hyperlipidemia Mother    Depression Mother  Fibroids Mother    Breast cancer Neg Hx     Social History   Socioeconomic History   Marital status: Divorced    Spouse name: Not on file   Number of children: Not on file   Years of education: Not on file   Highest education level: Not on file  Occupational History   Not on file  Tobacco Use   Smoking status: Former    Packs/day: 0    Types: Cigarettes   Smokeless tobacco: Never   Tobacco comments:    a cigarrete every few days   Vaping Use   Vaping Use: Never used  Substance and Sexual Activity   Alcohol use: Yes    Comment: occ   Drug use: No   Sexual activity: Not Currently    Birth control/protection: None    Comment: Hysterectomy 2018  Other Topics Concern   Not on file  Social History Narrative   Not on file   Social  Determinants of Health   Financial Resource Strain: Not on file  Food Insecurity: Not on file  Transportation Needs: Not on file  Physical Activity: Not on file  Stress: Not on file  Social Connections: Not on file  Intimate Partner Violence: Not on file    Review of Systems: See HPI, otherwise negative ROS  Physical Exam: LMP 05/27/2017 Comment: preg test neg General:   Alert,  pleasant and cooperative in NAD Head:  Normocephalic and atraumatic. Neck:  Supple; no masses or thyromegaly. Lungs:  Clear throughout to auscultation, normal respiratory effort.    Heart:  +S1, +S2, Regular rate and rhythm, No edema. Abdomen:  Soft, nontender and nondistended. Normal bowel sounds, without guarding, and without rebound.   Neurologic:  Alert and  oriented x4;  grossly normal neurologically.  Impression/Plan: Haley Garza is here for an colonoscopy to be performed for Screening colonoscopy average risk   Risks, benefits, limitations, and alternatives regarding  colonoscopy have been reviewed with the patient.  Questions have been answered.  All parties agreeable.   Haley Mood, MD  02/07/2023, 9:50 AM

## 2023-02-07 NOTE — Anesthesia Preprocedure Evaluation (Signed)
Anesthesia Evaluation  Patient identified by MRN, date of birth, ID band Patient awake    Reviewed: Allergy & Precautions, NPO status , Patient's Chart, lab work & pertinent test results  History of Anesthesia Complications (+) PONV and history of anesthetic complications  Airway Mallampati: II  TM Distance: >3 FB Neck ROM: Full    Dental no notable dental hx. (+) Teeth Intact   Pulmonary asthma , neg sleep apnea, neg COPD, Patient abstained from smoking.Not current smoker, former smoker   Pulmonary exam normal breath sounds clear to auscultation       Cardiovascular Exercise Tolerance: Good METS(-) hypertension(-) CAD and (-) Past MI negative cardio ROS (-) dysrhythmias  Rhythm:Regular Rate:Normal - Systolic murmurs    Neuro/Psych  PSYCHIATRIC DISORDERS Anxiety Depression    negative neurological ROS     GI/Hepatic ,neg GERD  ,,(+)     (-) substance abuse    Endo/Other  neg diabetes    Renal/GU negative Renal ROS     Musculoskeletal   Abdominal  (+) + obese  Peds  Hematology   Anesthesia Other Findings Past Medical History: No date: Anemia No date: Anxiety No date: Arthritis No date: Depression No date: History of kidney stones No date: IFG (impaired fasting glucose) No date: Obesity No date: PONV (postoperative nausea and vomiting) No date: Tobacco abuse  Reproductive/Obstetrics                             Anesthesia Physical Anesthesia Plan  ASA: 2  Anesthesia Plan: General   Post-op Pain Management: Minimal or no pain anticipated   Induction: Intravenous  PONV Risk Score and Plan: 4 or greater and Propofol infusion, TIVA and Ondansetron  Airway Management Planned: Nasal Cannula  Additional Equipment: None  Intra-op Plan:   Post-operative Plan:   Informed Consent: I have reviewed the patients History and Physical, chart, labs and discussed the procedure  including the risks, benefits and alternatives for the proposed anesthesia with the patient or authorized representative who has indicated his/her understanding and acceptance.     Dental advisory given  Plan Discussed with: CRNA and Surgeon  Anesthesia Plan Comments: (Discussed risks of anesthesia with patient, including possibility of difficulty with spontaneous ventilation under anesthesia necessitating airway intervention, PONV, and rare risks such as cardiac or respiratory or neurological events, and allergic reactions. Discussed the role of CRNA in patient's perioperative care. Patient understands.)       Anesthesia Quick Evaluation

## 2023-02-07 NOTE — Anesthesia Postprocedure Evaluation (Signed)
Anesthesia Post Note  Patient: Occupational psychologist  Procedure(s) Performed: COLONOSCOPY WITH PROPOFOL  Patient location during evaluation: Endoscopy Anesthesia Type: General Level of consciousness: awake and alert Pain management: pain level controlled Vital Signs Assessment: post-procedure vital signs reviewed and stable Respiratory status: spontaneous breathing, nonlabored ventilation, respiratory function stable and patient connected to nasal cannula oxygen Cardiovascular status: blood pressure returned to baseline and stable Postop Assessment: no apparent nausea or vomiting Anesthetic complications: no   No notable events documented.   Last Vitals:  Vitals:   02/07/23 1050 02/07/23 1110  BP: (!) 87/59 105/72  Pulse:    Resp:    Temp: 36.6 C   SpO2:      Last Pain:  Vitals:   02/07/23 1110  TempSrc:   PainSc: 0-No pain                 Corinda Gubler

## 2023-02-07 NOTE — Op Note (Signed)
[facility] Gastroenterology Patient Name: Haley Garza Procedure Date: 02/07/2023 10:26 AM MRN: [id number] Account #: [id number] Date of Birth: Jul 12, 1976 Admit Type: Outpatient Age: 47 Room: [facility] ENDO ROOM 4 Gender: Female Note Status: Finalized Instrument Name: Prentice Docker [id number] Procedure:             Colonoscopy Indications:           Screening for colorectal malignant neoplasm Providers:             Wyline Mood MD, MD Referring MD:          Dorcas Carrow (Referring MD) Medicines:             Monitored Anesthesia Care Complications:         No immediate complications. Procedure:             Pre-Anesthesia Assessment:                        - Prior to the procedure, a History and Physical was                         performed, and patient medications, allergies and                         sensitivities were reviewed. The patient's tolerance                         of previous anesthesia was reviewed.                        - The risks and benefits of the procedure and the                         sedation options and risks were discussed with the                         patient. All questions were answered and informed                         consent was obtained.                        - ASA Grade Assessment: II - A patient with mild                         systemic disease.                        After obtaining informed consent, the colonoscope was                         passed under direct vision. Throughout the procedure,                         the patient's blood pressure, pulse, and oxygen                         saturations were monitored continuously. The                         Colonoscope was introduced  through the anus and                         advanced to the the cecum, identified by the                         appendiceal orifice. The colonoscopy was performed                         with ease. The patient tolerated the procedure well.                          The quality of the bowel preparation was excellent.                         The ileocecal valve, appendiceal orifice, and rectum                         were photographed. Findings:      The perianal and digital rectal examinations were normal.      The entire examined colon appeared normal on direct and retroflexion       views. Impression:            - The entire examined colon is normal on direct and                         retroflexion views.                        - No specimens collected. Recommendation:        - Discharge patient to home (with escort).                        - Resume previous diet.                        - Continue present medications.                        - Repeat colonoscopy in 10 years for screening                         purposes. Procedure Code(s):     --- Professional ---                        [phone], Colonoscopy, flexible; diagnostic, including                         collection of specimen(s) by brushing or washing, when                         performed (separate procedure) Diagnosis Code(s):     --- Professional ---                        Z12.11, Encounter for screening for malignant neoplasm                         of colon CPT copyright 2022 [employer]. All rights reserved. The codes documented in this report  are preliminary and upon coder review may  be revised to meet current compliance requirements. Wyline Mood, MD Wyline Mood MD, MD 02/07/2023 10:48:24 AM This report has been signed electronically. Number of Addenda: 0 Note Initiated On: 02/07/2023 10:26 AM Scope Withdrawal Time: 0 hours 10 minutes 47 seconds  Total Procedure Duration: 0 hours 14 minutes 45 seconds  Estimated Blood Loss:  Estimated blood loss: none.      [facility]

## 2023-02-07 NOTE — Transfer of Care (Signed)
Immediate Anesthesia Transfer of Care Note  Patient: Haley Garza  Procedure(s) Performed: COLONOSCOPY WITH PROPOFOL  Patient Location: PACU  Anesthesia Type:General  Level of Consciousness: awake, alert , and oriented  Airway & Oxygen Therapy: Patient Spontanous Breathing  Post-op Assessment: Report given to RN and Post -op Vital signs reviewed and stable  Post vital signs: Reviewed and stable  Last Vitals:  Vitals Value Taken Time  BP 87/59 02/07/23 1050  Temp    Pulse 53 02/07/23 1049  Resp 15 02/07/23 1049  SpO2 100 % 02/07/23 1049  Vitals shown include unvalidated device data.  Last Pain:  Vitals:   02/07/23 1005  TempSrc: Temporal         Complications: No notable events documented.

## 2023-02-08 ENCOUNTER — Encounter: Payer: Self-pay | Admitting: Gastroenterology

## 2023-03-06 ENCOUNTER — Encounter: Payer: Self-pay | Admitting: Gastroenterology

## 2023-03-22 ENCOUNTER — Other Ambulatory Visit: Payer: Self-pay | Admitting: Family Medicine

## 2023-03-22 DIAGNOSIS — N6311 Unspecified lump in the right breast, upper outer quadrant: Secondary | ICD-10-CM

## 2023-04-09 ENCOUNTER — Other Ambulatory Visit: Payer: BC Managed Care – PPO

## 2023-10-15 ENCOUNTER — Encounter: Payer: Self-pay | Admitting: Family Medicine

## 2023-10-15 ENCOUNTER — Ambulatory Visit: Payer: No Typology Code available for payment source | Admitting: Family Medicine

## 2023-10-15 VITALS — BP 112/74 | HR 65 | Wt 220.0 lb

## 2023-10-15 DIAGNOSIS — M5442 Lumbago with sciatica, left side: Secondary | ICD-10-CM | POA: Diagnosis not present

## 2023-10-15 DIAGNOSIS — Z23 Encounter for immunization: Secondary | ICD-10-CM

## 2023-10-15 DIAGNOSIS — M542 Cervicalgia: Secondary | ICD-10-CM

## 2023-10-15 MED ORDER — HYDROCODONE-ACETAMINOPHEN 10-325 MG PO TABS: 1.0000 | ORAL_TABLET | Freq: Three times a day (TID) | ORAL | 0 refills | Status: DC | PRN

## 2023-10-15 MED ORDER — TIZANIDINE HCL 4 MG PO TABS: ORAL_TABLET | ORAL | 1 refills | Status: DC

## 2023-10-15 MED ORDER — TRIAMCINOLONE ACETONIDE 40 MG/ML IJ SUSP
40.0000 mg | Freq: Once | INTRAMUSCULAR | Status: AC
Start: 2023-10-15 — End: 2023-10-15
  Administered 2023-10-15: 40 mg via INTRAMUSCULAR

## 2023-10-15 MED ORDER — PREDNISONE 10 MG PO TABS
ORAL_TABLET | ORAL | 0 refills | Status: DC
Start: 2023-10-15 — End: 2023-10-29
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
## 2023-10-17 ENCOUNTER — Ambulatory Visit
Admission: RE | Admit: 2023-10-17 | Discharge: 2023-10-17 | Disposition: A | Discharge status: Home / Self Care | Payer: No Typology Code available for payment source | Attending: Family Medicine | Admitting: Family Medicine

## 2023-10-17 ENCOUNTER — Ambulatory Visit
Admission: RE | Admit: 2023-10-17 | Discharge: 2023-10-17 | Disposition: A | Discharge status: Home / Self Care | Payer: No Typology Code available for payment source | Source: Ambulatory Visit

## 2023-10-17 DIAGNOSIS — M5442 Lumbago with sciatica, left side: Secondary | ICD-10-CM | POA: Diagnosis present

## 2023-10-17 DIAGNOSIS — M542 Cervicalgia: Secondary | ICD-10-CM | POA: Diagnosis present

## 2023-10-29 ENCOUNTER — Ambulatory Visit (INDEPENDENT_AMBULATORY_CARE_PROVIDER_SITE_OTHER): Payer: No Typology Code available for payment source | Admitting: Family Medicine

## 2023-10-29 ENCOUNTER — Encounter: Payer: Self-pay | Admitting: Family Medicine

## 2023-10-29 VITALS — BP 101/63 | HR 61 | Wt 222.0 lb

## 2023-10-29 DIAGNOSIS — M542 Cervicalgia: Secondary | ICD-10-CM | POA: Diagnosis not present

## 2023-10-29 DIAGNOSIS — M5442 Lumbago with sciatica, left side: Secondary | ICD-10-CM | POA: Diagnosis not present

## 2023-10-29 MED ORDER — MELOXICAM 15 MG PO TABS: 15.0000 mg | ORAL_TABLET | Freq: Every day | ORAL | 1 refills | Status: DC

## 2023-10-29 MED ORDER — HYDROCODONE-ACETAMINOPHEN 10-325 MG PO TABS: 1.0000 | ORAL_TABLET | Freq: Three times a day (TID) | ORAL | 0 refills | Status: AC | PRN

## 2023-10-29 NOTE — Progress Notes (Signed)
 BP 101/63   Pulse 61   Wt 222 lb (100.7 kg)   LMP 05/27/2017 Comment: preg test neg  SpO2 100%   BMI 34.77 kg/m    Subjective:    Patient ID: Haley Garza, female    DOB: Oct 05, 1976, 47 y.o.   MRN: [id number]  HPI: Haley Garza is a 47 y.o. female  Chief Complaint  Patient presents with   Neck Pain    Patient says she is feeling better than she was and is still taking the medication every day.    Back Pain   NECK PAIN FOLLOW UP Status: better Treatments attempted: rest, ice, heat, APAP, ibuprofen , aleve , muscle relaxer, and HEP  Compliant with recommended treatment: yes Relief with NSAIDs?:  mild Location: bilateral Duration:weeks Severity: moderate Quality: aching and sore Frequency: constant Radiation: headache Aggravating factors: movement, stress Alleviating factors: medication Weakness:  no Paresthesias / decreased sensation:  no  Fevers:  no   Relevant past medical, surgical, family and social history reviewed and updated as indicated. Interim medical history since our last visit reviewed. Allergies and medications reviewed and updated.  Review of Systems  Constitutional: Negative.   Respiratory: Negative.    Cardiovascular: Negative.   Musculoskeletal:  Positive for back pain, myalgias and neck pain. Negative for arthralgias, gait problem, joint swelling and neck stiffness.  Skin: Negative.   Neurological: Negative.   Psychiatric/Behavioral: Negative.      Per HPI unless specifically indicated above     Objective:    BP 101/63   Pulse 61   Wt 222 lb (100.7 kg)   LMP 05/27/2017 Comment: preg test neg  SpO2 100%   BMI 34.77 kg/m   Wt Readings from Last 3 Encounters:  10/29/23 222 lb (100.7 kg)  10/15/23 220 lb (99.8 kg)  02/07/23 214 lb (97.1 kg)    Physical Exam Vitals and nursing note reviewed.  Constitutional:      General: She is not in acute distress.    Appearance: Normal appearance. She is not ill-appearing, toxic-appearing  or diaphoretic.  HENT:     Head: Normocephalic and atraumatic.     Right Ear: External ear normal.     Left Ear: External ear normal.     Nose: Nose normal.     Mouth/Throat:     Mouth: Mucous membranes are moist.     Pharynx: Oropharynx is clear.  Eyes:     General: No scleral icterus.       Right eye: No discharge.        Left eye: No discharge.     Extraocular Movements: Extraocular movements intact.     Conjunctiva/sclera: Conjunctivae normal.     Pupils: Pupils are equal, round, and reactive to light.  Cardiovascular:     Rate and Rhythm: Normal rate and regular rhythm.     Pulses: Normal pulses.     Heart sounds: Normal heart sounds. No murmur heard.    No friction rub. No gallop.  Pulmonary:     Effort: Pulmonary effort is normal. No respiratory distress.     Breath sounds: Normal breath sounds. No stridor. No wheezing, rhonchi or rales.  Chest:     Chest wall: No tenderness.  Musculoskeletal:        General: Normal range of motion.     Cervical back: Normal range of motion and neck supple.  Skin:    General: Skin is warm and dry.     Capillary Refill: Capillary refill takes less than 2  seconds.     Coloration: Skin is not jaundiced or pale.     Findings: No bruising, erythema, lesion or rash.  Neurological:     General: No focal deficit present.     Mental Status: She is alert and oriented to person, place, and time. Mental status is at baseline.  Psychiatric:        Mood and Affect: Mood normal.        Behavior: Behavior normal.        Thought Content: Thought content normal.        Judgment: Judgment normal.     Results for orders placed or performed in visit on 01/31/23  Microscopic Examination   Collection Time: 01/31/23  4:20 PM   Urine  Result Value Ref Range   WBC, UA None seen 0 - 5 /hpf   RBC, Urine 0-2 0 - 2 /hpf   Epithelial Cells (non renal) 0-10 0 - 10 /hpf   Bacteria, UA None seen None seen/Few  Urinalysis, Routine w reflex microscopic    Collection Time: 01/31/23  4:20 PM  Result Value Ref Range   Specific Gravity, UA 1.010 1.005 - 1.030   pH, UA 6.0 5.0 - 7.5   Color, UA Yellow Yellow   Appearance Ur Clear Clear   Leukocytes,UA Negative Negative   Protein,UA Negative Negative/Trace   Glucose, UA Negative Negative   Ketones, UA Negative Negative   RBC, UA Trace (A) Negative   Bilirubin, UA Negative Negative   Urobilinogen, Ur 0.2 0.2 - 1.0 mg/dL   Nitrite, UA Negative Negative   Microscopic Examination See below:   Bayer DCA Hb A1c Waived   Collection Time: 01/31/23  4:21 PM  Result Value Ref Range   HB A1C (BAYER DCA - WAIVED) 5.0 4.8 - 5.6 %  CBC with Differential/Platelet   Collection Time: 01/31/23  4:23 PM  Result Value Ref Range   WBC 6.4 3.4 - 10.8 x10E3/uL   RBC 4.09 3.77 - 5.28 x10E6/uL   Hemoglobin 13.3 11.1 - 15.9 g/dL   Hematocrit 59.9 65.9 - 46.6 %   MCV 98 (H) 79 - 97 fL   MCH 32.5 26.6 - 33.0 pg   MCHC 33.3 31.5 - 35.7 g/dL   RDW 87.7 88.2 - 84.5 %   Platelets 245 150 - 450 x10E3/uL   Neutrophils 56 Not Estab. %   Lymphs 33 Not Estab. %   Monocytes 8 Not Estab. %   Eos 2 Not Estab. %   Basos 1 Not Estab. %   Neutrophils Absolute 3.6 1.4 - 7.0 x10E3/uL   Lymphocytes Absolute 2.1 0.7 - 3.1 x10E3/uL   Monocytes Absolute 0.5 0.1 - 0.9 x10E3/uL   EOS (ABSOLUTE) 0.1 0.0 - 0.4 x10E3/uL   Basophils Absolute 0.0 0.0 - 0.2 x10E3/uL   Immature Granulocytes 0 Not Estab. %   Immature Grans (Abs) 0.0 0.0 - 0.1 x10E3/uL  Comprehensive metabolic panel   Collection Time: 01/31/23  4:23 PM  Result Value Ref Range   Glucose 75 70 - 99 mg/dL   BUN 8 6 - 24 mg/dL   Creatinine, Ser 9.05 0.57 - 1.00 mg/dL   eGFR 76 >40 fO/fpw/8.26   BUN/Creatinine Ratio 9 9 - 23   Sodium 140 134 - 144 mmol/L   Potassium 4.3 3.5 - 5.2 mmol/L   Chloride 103 96 - 106 mmol/L   CO2 21 20 - 29 mmol/L   Calcium 8.9 8.7 - 10.2 mg/dL   Total Protein 6.6 6.0 -  8.5 g/dL   Albumin 4.2 3.9 - 4.9 g/dL   Globulin, Total 2.4 1.5 -  4.5 g/dL   Albumin/Globulin Ratio 1.8 1.2 - 2.2   Bilirubin Total 0.8 0.0 - 1.2 mg/dL   Alkaline Phosphatase 72 44 - 121 IU/L   AST 15 0 - 40 IU/L   ALT 23 0 - 32 IU/L  Lipid Panel w/o Chol/HDL Ratio   Collection Time: 01/31/23  4:23 PM  Result Value Ref Range   Cholesterol, Total 153 100 - 199 mg/dL   Triglycerides 61 0 - 149 mg/dL   HDL 73 >60 mg/dL   VLDL Cholesterol Cal 12 5 - 40 mg/dL   LDL Chol Calc (NIH) 68 0 - 99 mg/dL  TSH   Collection Time: 01/31/23  4:23 PM  Result Value Ref Range   TSH 1.410 0.450 - 4.500 uIU/mL  VITAMIN D  25 Hydroxy (Vit-D Deficiency, Fractures)   Collection Time: 01/31/23  4:23 PM  Result Value Ref Range   Vit D, 25-Hydroxy 29.1 (L) 30.0 - 100.0 ng/mL  B12   Collection Time: 01/31/23  4:23 PM  Result Value Ref Range   Vitamin B-12 1,644 (H) 232 - 1,245 pg/mL      Assessment & Plan:   Problem List Items Addressed This Visit   None Visit Diagnoses       Neck pain    -  Primary   Due to start PT shortly. Continue medication. refill of pain medicine given. Call with any concerns.     Acute bilateral low back pain with left-sided sciatica       Due to start PT shortly. Continue medication. refill of pain medicine given. Call with any concerns.   Relevant Medications   meloxicam  (MOBIC ) 15 MG tablet   HYDROcodone -acetaminophen  (NORCO) 10-325 MG tablet        Follow up plan: Return 4-6 weks.

## 2023-11-03 ENCOUNTER — Encounter: Payer: Self-pay | Admitting: Family Medicine

## 2023-12-11 ENCOUNTER — Ambulatory Visit: Payer: No Typology Code available for payment source | Admitting: Family Medicine

## 2023-12-19 ENCOUNTER — Ambulatory Visit: Payer: No Typology Code available for payment source | Admitting: Family Medicine

## 2023-12-23 ENCOUNTER — Ambulatory Visit: Payer: No Typology Code available for payment source | Admitting: Family Medicine

## 2024-01-27 ENCOUNTER — Ambulatory Visit (INDEPENDENT_AMBULATORY_CARE_PROVIDER_SITE_OTHER): Payer: Self-pay | Admitting: Podiatry

## 2024-01-27 ENCOUNTER — Encounter: Payer: Self-pay | Admitting: Podiatry

## 2024-01-27 ENCOUNTER — Ambulatory Visit (INDEPENDENT_AMBULATORY_CARE_PROVIDER_SITE_OTHER)

## 2024-01-27 DIAGNOSIS — M79671 Pain in right foot: Secondary | ICD-10-CM | POA: Diagnosis not present

## 2024-01-27 DIAGNOSIS — M722 Plantar fascial fibromatosis: Secondary | ICD-10-CM | POA: Diagnosis not present

## 2024-01-27 DIAGNOSIS — Q666 Other congenital valgus deformities of feet: Secondary | ICD-10-CM

## 2024-01-27 DIAGNOSIS — M7751 Other enthesopathy of right foot: Secondary | ICD-10-CM

## 2024-01-27 NOTE — Patient Instructions (Signed)
 For instructions on how to put on your Plantar Fascial Brace, please visit [email or web address]  --   Fasciitis (Heel Spur Syndrome) with Rehab The plantar fascia is a fibrous, ligament-like, soft-tissue structure that spans the bottom of the foot. Plantar fasciitis is a condition that causes pain in the foot due to inflammation of the tissue. SYMPTOMS  Pain and tenderness on the underneath side of the foot. Pain that worsens with standing or walking. CAUSES  Plantar fasciitis is caused by irritation and injury to the plantar fascia on the underneath side of the foot. Common mechanisms of injury include: Direct trauma to bottom of the foot. Damage to a small nerve that runs under the foot where the main fascia attaches to the heel bone. Stress placed on the plantar fascia due to bone spurs. RISK INCREASES WITH:  Activities that place stress on the plantar fascia (running, jumping, pivoting, or cutting). Poor strength and flexibility. Improperly fitted shoes. Tight calf muscles. Flat feet. Failure to warm-up properly before activity. Obesity. PREVENTION Warm up and stretch properly before activity. Allow for adequate recovery between workouts. Maintain physical fitness: Strength, flexibility, and endurance. Cardiovascular fitness. Maintain a health body weight. Avoid stress on the plantar fascia. Wear properly fitted shoes, including arch supports for individuals who have flat feet.  PROGNOSIS  If treated properly, then the symptoms of plantar fasciitis usually resolve without surgery. However, occasionally surgery is necessary.  RELATED COMPLICATIONS  Recurrent symptoms that may result in a chronic condition. Problems of the lower back that are caused by compensating for the injury, such as limping. Pain or weakness of the foot during push-off following surgery. Chronic inflammation, scarring, and partial or complete fascia tear, occurring more often from repeated  injections.  TREATMENT  Treatment initially involves the use of ice and medication to help reduce pain and inflammation. The use of strengthening and stretching exercises may help reduce pain with activity, especially stretches of the Achilles tendon. These exercises may be performed at home or with a therapist. Your caregiver may recommend that you use heel cups of arch supports to help reduce stress on the plantar fascia. Occasionally, corticosteroid injections are given to reduce inflammation. If symptoms persist for greater than 6 months despite non-surgical (conservative), then surgery may be recommended.   MEDICATION  If pain medication is necessary, then nonsteroidal anti-inflammatory medications, such as aspirin and ibuprofen, or other minor pain relievers, such as acetaminophen, are often recommended. Do not take pain medication within 7 days before surgery. Prescription pain relievers may be given if deemed necessary by your caregiver. Use only as directed and only as much as you need. Corticosteroid injections may be given by your caregiver. These injections should be reserved for the most serious cases, because they may only be given a certain number of times.  HEAT AND COLD Cold treatment (icing) relieves pain and reduces inflammation. Cold treatment should be applied for 10 to 15 minutes every 2 to 3 hours for inflammation and pain and immediately after any activity that aggravates your symptoms. Use ice packs or massage the area with a piece of ice (ice massage). Heat treatment may be used prior to performing the stretching and strengthening activities prescribed by your caregiver, physical therapist, or athletic trainer. Use a heat pack or soak the injury in warm water.  SEEK IMMEDIATE MEDICAL CARE IF: Treatment seems to offer no benefit, or the condition worsens. Any medications produce adverse side effects.  EXERCISES- RANGE OF MOTION (ROM) AND STRETCHING EXERCISES - Plantar  Fasciitis (Heel Spur Syndrome) These exercises may help you when beginning to rehabilitate your injury. Your symptoms may resolve with or without further involvement from your physician, physical therapist or athletic trainer. While completing these exercises, remember:  Restoring tissue flexibility helps normal motion to return to the joints. This allows healthier, less painful movement and activity. An effective stretch should be held for at least 30 seconds. A stretch should never be painful. You should only feel a gentle lengthening or release in the stretched tissue.  RANGE OF MOTION - Toe Extension, Flexion Sit with your right / left leg crossed over your opposite knee. Grasp your toes and gently pull them back toward the top of your foot. You should feel a stretch on the bottom of your toes and/or foot. Hold this stretch for 10 seconds. Now, gently pull your toes toward the bottom of your foot. You should feel a stretch on the top of your toes and or foot. Hold this stretch for 10 seconds. Repeat  times. Complete this stretch 3 times per day.   RANGE OF MOTION - Ankle Dorsiflexion, Active Assisted Remove shoes and sit on a chair that is preferably not on a carpeted surface. Place right / left foot under knee. Extend your opposite leg for support. Keeping your heel down, slide your right / left foot back toward the chair until you feel a stretch at your ankle or calf. If you do not feel a stretch, slide your bottom forward to the edge of the chair, while still keeping your heel down. Hold this stretch for 10 seconds. Repeat 3 times. Complete this stretch 2 times per day.   STRETCH  Gastroc, Standing Place hands on wall. Extend right / left leg, keeping the front knee somewhat bent. Slightly point your toes inward on your back foot. Keeping your right / left heel on the floor and your knee straight, shift your weight toward the wall, not allowing your back to arch. You should feel a  gentle stretch in the right / left calf. Hold this position for 10 seconds. Repeat 3 times. Complete this stretch 2 times per day.  STRETCH  Soleus, Standing Place hands on wall. Extend right / left leg, keeping the other knee somewhat bent. Slightly point your toes inward on your back foot. Keep your right / left heel on the floor, bend your back knee, and slightly shift your weight over the back leg so that you feel a gentle stretch deep in your back calf. Hold this position for 10 seconds. Repeat 3 times. Complete this stretch 2 times per day.  STRETCH  Gastrocsoleus, Standing  Note: This exercise can place a lot of stress on your foot and ankle. Please complete this exercise only if specifically instructed by your caregiver.  Place the ball of your right / left foot on a step, keeping your other foot firmly on the same step. Hold on to the wall or a rail for balance. Slowly lift your other foot, allowing your body weight to press your heel down over the edge of the step. You should feel a stretch in your right / left calf. Hold this position for 10 seconds. Repeat this exercise with a slight bend in your right / left knee. Repeat 3 times. Complete this stretch 2 times per day.   STRENGTHENING EXERCISES - Plantar Fasciitis (Heel Spur Syndrome)  These exercises may help you when beginning to rehabilitate your injury. They may resolve your symptoms with or without further involvement  from your physician, physical therapist or athletic trainer. While completing these exercises, remember:  Muscles can gain both the endurance and the strength needed for everyday activities through controlled exercises. Complete these exercises as instructed by your physician, physical therapist or athletic trainer. Progress the resistance and repetitions only as guided.  STRENGTH - Towel Curls Sit in a chair positioned on a non-carpeted surface. Place your foot on a towel, keeping your heel on the  floor. Pull the towel toward your heel by only curling your toes. Keep your heel on the floor. Repeat 3 times. Complete this exercise 2 times per day.  STRENGTH - Ankle Inversion Secure one end of a rubber exercise band/tubing to a fixed object (table, pole). Loop the other end around your foot just before your toes. Place your fists between your knees. This will focus your strengthening at your ankle. Slowly, pull your big toe up and in, making sure the band/tubing is positioned to resist the entire motion. Hold this position for 10 seconds. Have your muscles resist the band/tubing as it slowly pulls your foot back to the starting position. Repeat 3 times. Complete this exercises 2 times per day.  Document Released: 10/15/2005 Document Revised: 01/07/2012 Document Reviewed: 01/27/2009 [facility] Patient Information 2014 [place], [place].

## 2024-01-27 NOTE — Progress Notes (Signed)
 Subjective:  Patient ID: Haley Garza, female    DOB: 05-24-1976,  MRN: [id number]  Chief Complaint  Patient presents with   Foot Pain    RM#13 Right foot pain mainly on heel and pinky toes underneath has progressed feels like walking on rocks.    Discussed the use of AI scribe software for clinical note transcription with the patient, who gave verbal consent to proceed.  History of Present Illness The patient presents with right foot pain that has been progressively worsening over the past six months, with significant increase in the last two months. The pain is located in the heel and under the baby toe area. The patient denies any recent injuries to the foot. She has tried changing shoes and insoles, but the pain persists. The patient reports that she cannot walk barefoot or with a thin shoe due to the severity of the pain. The pain is worse in the afternoon, especially after being on her feet. In the morning, the patient experiences pressure on the foot and has to wear slippers instead of walking barefoot. There is no reported swelling, numbness, or tingling.  States her toenail has been doing well.  No issues.      Objective:    Physical Exam General: AAO x3, NAD  Dermatological: Small punctate annular hyperkeratotic lesion noted on the right heel without any underlying ulceration or drainage or signs infection.  No evidence of verruca or foreign body today.  Vascular: Dorsalis Pedis artery and Posterior Tibial artery pedal pulses are 2/4 bilateral with immedate capillary fill time.  There is no pain with calf compression, swelling, warmth, erythema.   Neruologic: Grossly intact via light touch bilateral.  Negative Tinel sign.  Musculoskeletal: Decreased medial arch upon weightbearing.  No pain on ankle, Achilles tendon.  There is tenderness subjectively on insertion of plantar fascia, plantar aspect the heel but no significant pain today.  Also subjectively she has pain on the  fifth MTPJ.  Not able to appreciate any area pinpoint tenderness today.  Flexor, extensor tendons intact.  MMT grossly 5.  Gait: Unassisted, Nonantalgic.         Results Procedure: Callus trimming Description: Trimmed callus on the bottom of the right foot. No underlying abnormalities observed.  RADIOLOGY Foot X-ray: Tiny heel spurs in the bottom and back of the heel, inflammation around the heel spur, no significant bunion, normal bone protrusion on the fifth metatarsal (01/27/2024)   Assessment:   1. Plantar fasciitis   2. Pes planovalgus   3. Capsulitis of toe, right      Plan:  Patient was evaluated and treated and all questions answered.  Assessment and Plan Assessment & Plan Plantar fasciitis-like condition; pes planovalgus She presents with right foot pain, primarily in the heel and under the fifth toe, progressively worsening over six months, with significant discomfort in the afternoons and upon waking. Examination reveals a flatter foot structure with pronation, contributing to mechanical strain. X-rays show small heel spurs, but the pain is attributed to inflammation of the soft tissue around the plantar fascia rather than the spurs. The condition is described as plantar fasciitis-like due to inflammation and mechanical strain from foot structure and gait. Treatment involves addressing both inflammation and mechanical issues. Oral anti-inflammatories are avoided due to her bariatric surgery history to prevent gastrointestinal complications. Topical treatments and mechanical support are emphasized. -She can use Voltaren gel for topical anti-inflammatory treatment to avoid gastrointestinal risks associated with oral NSAIDs. - Advise icing the heel daily, using  a frozen water bottle to roll under the foot to reduce inflammation. - Provide a worksheet with exercises for stretching the calf muscle, Achilles tendon, and plantar fascia to alleviate mechanical strain. - Recommend  a daytime brace to offload pressure from the heel and support foot structure.  Dispensed plantar fascia brace to help support stabilize the plantar fascia. - Consult with Trish, a pedorthist, for custom insoles to address foot structure and pressure points, as over-the-counter insoles have been insufficient.   Return for pick up orthotics .

## 2024-02-20 ENCOUNTER — Ambulatory Visit
Admission: EM | Admit: 2024-02-20 | Discharge: 2024-02-20 | Disposition: A | Discharge status: Home / Self Care | Attending: Nurse Practitioner | Admitting: Nurse Practitioner

## 2024-02-20 DIAGNOSIS — Z8739 Personal history of other diseases of the musculoskeletal system and connective tissue: Secondary | ICD-10-CM | POA: Diagnosis not present

## 2024-02-20 DIAGNOSIS — G8929 Other chronic pain: Secondary | ICD-10-CM

## 2024-02-20 DIAGNOSIS — M5442 Lumbago with sciatica, left side: Secondary | ICD-10-CM

## 2024-02-20 MED ORDER — DEXAMETHASONE SODIUM PHOSPHATE 10 MG/ML IJ SOLN
10.0000 mg | INTRAMUSCULAR | Status: AC
  Administered 2024-02-20: 10 mg via INTRAMUSCULAR

## 2024-02-20 MED ORDER — PREDNISONE 20 MG PO TABS: 40.0000 mg | ORAL_TABLET | Freq: Every day | ORAL | 0 refills | Status: AC

## 2024-02-20 MED ORDER — KETOROLAC TROMETHAMINE 60 MG/2ML IM SOLN
60.0000 mg | Freq: Once | INTRAMUSCULAR | Status: AC
  Administered 2024-02-20: 60 mg via INTRAMUSCULAR

## 2024-02-20 MED ORDER — TIZANIDINE HCL 4 MG PO TABS: 4.0000 mg | ORAL_TABLET | Freq: Three times a day (TID) | ORAL | 0 refills | Status: DC | PRN

## 2024-02-20 NOTE — ED Triage Notes (Signed)
 Pt reports lower back pain, that radiates down into the left hip/ left leg. x 1 week.

## 2024-02-20 NOTE — Discharge Instructions (Signed)
 You were given injections of Decadron  10 mg and Toradol  60 mg.  May take over-the-counter Tylenol  for breakthrough pain or discomfort. Recommend the use of ice or heat.  Apply ice for pain or swelling, heat for spasm or stiffness.  Apply for 20 minutes, remove for 1 hour, repeat as needed. Try to stay as active as possible. Recommend gentle exercises and stretching while symptoms persist.  I provided exercises for you to perform.  Recommend performing the exercises at least 2-3 times daily. Go to the emergency department if you experience worsening low back pain, develops the loss of your bowel or bladder function, or become unable to ambulate. Please follow-up with your PCP or with orthopedics for further evaluation and treatment if symptoms fail to improve. Follow-up as needed.

## 2024-02-20 NOTE — ED Provider Notes (Signed)
 [facility]    CSN: [id number] Arrival date & time: 02/20/24  1235      History   Chief Complaint No chief complaint on file.   HPI Haley Garza is a 48 y.o. female.   The history is provided by the patient.   Patient presents for complaints of bilateral low back pain and pain that runs down the left leg.  Rates pain 5/10 at present.  Patient states that she does have a prior Worker's Comp. injury for her back, also states that she had a previous MRI performed that showed that she had a herniated disc.  She states that she is having to seek her own medical care because Worker's Comp. was not cooperating at this time.  She states pain started approximately 2 years ago, states that she has had worsening pain over the past 2 days.  She denies inability to ambulate, loss of bowel or bladder function, bruising, redness, or swelling, or urinary symptoms.  States that she has been taking tizanidine  and using heat for her symptoms.  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   Bariatric surgery status 09/14/2015   Asthma 09/14/2015   Anxiety and depression 09/14/2015   Tobacco abuse 09/14/2015   IFG (impaired fasting glucose) 09/14/2015    Past Surgical History:  Procedure Laterality Date   BARIATRIC SURGERY     BREAST BIOPSY Right 03/22/2021   us  bx of mass, venus marker, path pending   CESAREAN SECTION     x 2    COLONOSCOPY WITH PROPOFOL  N/A 02/07/2023   Procedure: COLONOSCOPY WITH PROPOFOL ;  Surgeon: Luke Salaam, MD;  Location: [facility] ENDOSCOPY;  Service: Gastroenterology;  Laterality: N/A;   CYSTOSCOPY N/A 06/18/2017    Procedure: CYSTOSCOPY;  Surgeon: Darl Edu, MD;  Location: [facility] ORS;  Service: Gynecology;  Laterality: N/A;   INTRAUTERINE DEVICE (IUD) INSERTION     feb 2014   KNEE ARTHROSCOPY Right    LAPAROSCOPIC HYSTERECTOMY N/A 06/18/2017   Procedure: HYSTERECTOMY TOTAL LAPAROSCOPIC;  Surgeon: Darl Edu, MD;  Location: [facility] ORS;  Service: Gynecology;  Laterality: N/A;   TOTAL ABDOMINAL HYSTERECTOMY     TUBAL LIGATION     UTERINE FIBROID EMBOLIZATION  01/09/2017   Triangular vascular    OB History     Gravida  3   Para  3   Term  3   Preterm      AB      Living  3      SAB      IAB      Ectopic      Multiple      Live Births  3            Home Medications    Prior to Admission medications   Medication Sig Start Date End Date Taking? Authorizing Provider  acetaminophen  (TYLENOL ) 500 MG tablet Take 500 mg by mouth every 6 (six) hours as needed.    [provider]  Ascorbic Acid (VITAMIN C) 1000 MG tablet Take 1,000 mg by mouth daily. Chewable  [provider]  b complex vitamins tablet Take 1 tablet by mouth daily.    [provider]  baclofen  (LIORESAL ) 10 MG tablet Take 20 mg by mouth at bedtime.    [provider]  Biotin 5000 MCG CAPS Take 1 capsule by mouth daily.    [provider]  Cholecalciferol (VITAMIN D3) 5000 units CAPS Take 5,000 Units by mouth daily.    [provider]  ferrous sulfate 325 (65 FE) MG tablet Take 65 mg by mouth daily with breakfast.    [provider]  fluticasone  (FLONASE ) 50 MCG/ACT nasal spray Place 1 spray into both nostrils daily. Begin by using 2 sprays in each nare daily for 3 to 5 days, then decrease to 1 spray in each nare daily. 09/28/22   Eloise Hake Scales, PA-C  meloxicam  (MOBIC ) 15 MG tablet Take 1 tablet (15 mg total) by mouth daily. 10/29/23 10/28/24  Terre Ferri P, DO  Multiple Vitamin (MULTIVITAMIN) tablet Take 1 tablet by mouth daily.     [provider]  promethazine -dextromethorphan (PROMETHAZINE -DM) 6.25-15 MG/5ML syrup Take 5 mLs by mouth every 6 (six) hours as needed. 10/22/23   [provider]  tiZANidine  (ZANAFLEX ) 4 MG tablet 2mg  during the day every 6 hours as needed, 8mg  at bedtime 10/15/23   Johnson, Megan P, DO  vitamin A 8000 UNIT capsule Take 8,000 Units by mouth daily.    [provider]  vitamin E 200 UNIT capsule Take 200 Units by mouth daily.    [provider]  VITAMIN K PO Take 100 mcg by mouth daily.    [provider]    Family History Family History  Problem Relation Age of Onset   Hypertension Mother    Hyperlipidemia Mother    Depression Mother    Fibroids Mother    Breast cancer Neg Hx     Social History Social History   Tobacco Use   Smoking status: Former    Types: Cigarettes   Smokeless tobacco: Never   Tobacco comments:    a cigarrete every few days   Vaping Use   Vaping status: Never Used  Substance Use Topics   Alcohol use: Yes    Comment: occ   Drug use: No     Allergies   Honey bee treatment [bee venom] and Oxycodone -acetaminophen    Review of Systems Review of Systems Per HPI  Physical Exam Triage Vital Signs ED Triage Vitals  Encounter Vitals Group     BP 02/20/24 1248 100/64     Systolic BP Percentile --      Diastolic BP Percentile --      Pulse Rate 02/20/24 1248 66     Resp 02/20/24 1248 18     Temp 02/20/24 1248 98.6 F (37 C)     Temp Source 02/20/24 1248 Oral     SpO2 02/20/24 1248 97 %     Weight --      Height --      Head Circumference --      Peak Flow --      Pain Score 02/20/24 1252 5     Pain Loc --      Pain Education --      Exclude from Growth Chart --    No data found.  Updated Vital Signs BP 100/64 (BP Location: Right Arm)   Pulse 66   Temp 98.6 F (37 C) (Oral)   Resp 18   LMP 05/27/2017 Comment: preg test  neg  SpO2 97%   Visual Acuity Right Eye Distance:   Left Eye  Distance:   Bilateral Distance:    Right Eye Near:   Left Eye Near:    Bilateral Near:     Physical Exam Vitals and nursing note reviewed.  Constitutional:      General: She is not in acute distress.    Appearance: Normal appearance.  HENT:     Head: Normocephalic.  Eyes:     Extraocular Movements: Extraocular movements intact.     Pupils: Pupils are equal, round, and reactive to light.  Cardiovascular:     Rate and Rhythm: Normal rate and regular rhythm.     Pulses: Normal pulses.     Heart sounds: Normal heart sounds.  Pulmonary:     Effort: Pulmonary effort is normal. No respiratory distress.     Breath sounds: Normal breath sounds. No stridor. No wheezing, rhonchi or rales.  Abdominal:     General: Bowel sounds are normal.     Palpations: Abdomen is soft.     Tenderness: There is no abdominal tenderness.  Musculoskeletal:     Cervical back: Normal range of motion.     Lumbar back: Tenderness (Bilateral low back) present. No swelling, edema, deformity or lacerations. Decreased range of motion.  Skin:    General: Skin is warm and dry.  Neurological:     General: No focal deficit present.     Mental Status: She is alert and oriented to person, place, and time.  Psychiatric:        Mood and Affect: Mood normal.        Behavior: Behavior normal.      UC Treatments / Results  Labs (all labs ordered are listed, but only abnormal results are displayed) Labs Reviewed - No data to display  EKG   Radiology No results found.  Procedures Procedures (including critical care time)  Medications Ordered in UC Medications  dexamethasone  (DECADRON ) injection 10 mg (has no administration in time range)  ketorolac  (TORADOL ) injection 60 mg (has no administration in time range)    Initial Impression / Assessment and Plan / UC Course  I have reviewed the triage vital signs and the nursing notes.  Pertinent labs & imaging results that were available during my care of the  patient were reviewed by me and considered in my medical decision making (see chart for details).  Patient with history of low back pain.  She does present with bilateral low back pain with left-sided sciatica today.  Decadron  10 mg IM and Toradol  60 mg IM administered for pain.  Will start patient on prednisone  20 mg for the next 5 days along with tizanidine  4 mg for back pain and spasm.  Supportive care recommendations were provided and discussed with the patient to include gentle stretching exercises, staying active, and continuing to use ice or heat.  Patient was given strict ER follow-up precautions.  Patient was in agreement with this plan of care and verbalizes understanding.  All questions were answered.  Patient stable for discharge.  Work note was provided.  Final Clinical Impressions(s) / UC Diagnoses   Final diagnoses:  None   Discharge Instructions   None    ED Prescriptions   None    PDMP not reviewed this encounter.   Hardy Lia, NP 02/20/24 1326

## 2024-02-25 ENCOUNTER — Encounter: Payer: Self-pay | Admitting: Family Medicine

## 2024-02-25 ENCOUNTER — Ambulatory Visit (INDEPENDENT_AMBULATORY_CARE_PROVIDER_SITE_OTHER): Admitting: Family Medicine

## 2024-02-25 VITALS — BP 121/78 | HR 50 | Ht 66.0 in | Wt 224.8 lb

## 2024-02-25 DIAGNOSIS — M4716 Other spondylosis with myelopathy, lumbar region: Secondary | ICD-10-CM | POA: Diagnosis not present

## 2024-02-25 MED ORDER — GABAPENTIN 100 MG PO CAPS: ORAL_CAPSULE | ORAL | 3 refills | Status: DC

## 2024-02-25 NOTE — Assessment & Plan Note (Signed)
 Urgent referral to neurosurgery placed today to determine next steps. Will treat with gabapentin. Call with any concerns. Follow up in 1 month.

## 2024-02-25 NOTE — Progress Notes (Signed)
 BP 121/78 (BP Location: Left Arm, Patient Position: Sitting, Cuff Size: Normal)   Pulse (!) 50   Ht 5\' 6"  (1.676 m)   Wt 224 lb 12.8 oz (102 kg)   LMP 05/27/2017 Comment: preg test neg  SpO2 99%   BMI 36.28 kg/m    Subjective:    Patient ID: Haley Garza, female    DOB: 03/04/1976, 48 y.o.   MRN: [id number]  HPI: Haley Garza is a 48 y.o. female  Chief Complaint  Patient presents with   Back Pain   BACK PAIN- has been seeing [facility] and was told that she needs surgery. She is anxious about this Duration: Chronic Mechanism of injury: unknown Location: low back Onset: sudden Severity: severe Quality: aching and shooting and numb Frequency: constant Radiation: L leg Aggravating factors: moving Alleviating factors: nothing Status: worse Treatments attempted: aleve , physical therapy, and HEP  Relief with NSAIDs?: no Nighttime pain:  yes Paresthesias / decreased sensation:  yes Bowel / bladder incontinence:  no Fevers:  no Dysuria / urinary frequency:  no  Relevant past medical, surgical, family and social history reviewed and updated as indicated. Interim medical history since our last visit reviewed. Allergies and medications reviewed and updated.  Review of Systems  Constitutional: Negative.   Respiratory: Negative.    Musculoskeletal:  Positive for back pain, gait problem and myalgias. Negative for arthralgias, joint swelling, neck pain and neck stiffness.  Skin: Negative.   Neurological:  Positive for weakness and numbness. Negative for dizziness, tremors, seizures, syncope, facial asymmetry, speech difficulty, light-headedness and headaches.  Psychiatric/Behavioral: Negative.      Per HPI unless specifically indicated above     Objective:    BP 121/78 (BP Location: Left Arm, Patient Position: Sitting, Cuff Size: Normal)   Pulse (!) 50   Ht 5\' 6"  (1.676 m)   Wt 224 lb 12.8 oz (102 kg)   LMP 05/27/2017 Comment: preg test neg  SpO2 99%   BMI  36.28 kg/m   Wt Readings from Last 3 Encounters:  02/25/24 224 lb 12.8 oz (102 kg)  10/29/23 222 lb (100.7 kg)  10/15/23 220 lb (99.8 kg)    Physical Exam Vitals and nursing note reviewed.  Constitutional:      General: She is not in acute distress.    Appearance: Normal appearance. She is not ill-appearing, toxic-appearing or diaphoretic.  HENT:     Head: Normocephalic and atraumatic.     Right Ear: External ear normal.     Left Ear: External ear normal.     Nose: Nose normal.     Mouth/Throat:     Mouth: Mucous membranes are moist.     Pharynx: Oropharynx is clear.  Eyes:     General: No scleral icterus.       Right eye: No discharge.        Left eye: No discharge.     Extraocular Movements: Extraocular movements intact.     Conjunctiva/sclera: Conjunctivae normal.     Pupils: Pupils are equal, round, and reactive to light.  Cardiovascular:     Rate and Rhythm: Normal rate and regular rhythm.     Pulses: Normal pulses.     Heart sounds: Normal heart sounds. No murmur heard.    No friction rub. No gallop.  Pulmonary:     Effort: Pulmonary effort is normal. No respiratory distress.     Breath sounds: Normal breath sounds. No stridor. No wheezing, rhonchi or rales.  Chest:  Chest wall: No tenderness.  Musculoskeletal:        General: Normal range of motion.     Cervical back: Normal range of motion and neck supple.  Skin:    General: Skin is warm and dry.     Capillary Refill: Capillary refill takes less than 2 seconds.     Coloration: Skin is not jaundiced or pale.     Findings: No bruising, erythema, lesion or rash.  Neurological:     General: No focal deficit present.     Mental Status: She is alert and oriented to person, place, and time. Mental status is at baseline.  Psychiatric:        Mood and Affect: Mood normal.        Behavior: Behavior normal.        Thought Content: Thought content normal.        Judgment: Judgment normal.     Results for orders  placed or performed in visit on 01/31/23  Microscopic Examination   Collection Time: 01/31/23  4:20 PM   Urine  Result Value Ref Range   WBC, UA None seen 0 - 5 /hpf   RBC, Urine 0-2 0 - 2 /hpf   Epithelial Cells (non renal) 0-10 0 - 10 /hpf   Bacteria, UA None seen None seen/Few  Urinalysis, Routine w reflex microscopic   Collection Time: 01/31/23  4:20 PM  Result Value Ref Range   Specific Gravity, UA 1.010 1.005 - 1.030   pH, UA 6.0 5.0 - 7.5   Color, UA Yellow Yellow   Appearance Ur Clear Clear   Leukocytes,UA Negative Negative   Protein,UA Negative Negative/Trace   Glucose, UA Negative Negative   Ketones, UA Negative Negative   RBC, UA Trace (A) Negative   Bilirubin, UA Negative Negative   Urobilinogen, Ur 0.2 0.2 - 1.0 mg/dL   Nitrite, UA Negative Negative   Microscopic Examination See below:   Bayer DCA Hb A1c Waived   Collection Time: 01/31/23  4:21 PM  Result Value Ref Range   HB A1C (BAYER DCA - WAIVED) 5.0 4.8 - 5.6 %  CBC with Differential/Platelet   Collection Time: 01/31/23  4:23 PM  Result Value Ref Range   WBC 6.4 3.4 - 10.8 x10E3/uL   RBC 4.09 3.77 - 5.28 x10E6/uL   Hemoglobin 13.3 11.1 - 15.9 g/dL   Hematocrit 40.9 81.1 - 46.6 %   MCV 98 (H) 79 - 97 fL   MCH 32.5 26.6 - 33.0 pg   MCHC 33.3 31.5 - 35.7 g/dL   RDW 91.4 78.2 - 95.6 %   Platelets 245 150 - 450 x10E3/uL   Neutrophils 56 Not Estab. %   Lymphs 33 Not Estab. %   Monocytes 8 Not Estab. %   Eos 2 Not Estab. %   Basos 1 Not Estab. %   Neutrophils Absolute 3.6 1.4 - 7.0 x10E3/uL   Lymphocytes Absolute 2.1 0.7 - 3.1 x10E3/uL   Monocytes Absolute 0.5 0.1 - 0.9 x10E3/uL   EOS (ABSOLUTE) 0.1 0.0 - 0.4 x10E3/uL   Basophils Absolute 0.0 0.0 - 0.2 x10E3/uL   Immature Granulocytes 0 Not Estab. %   Immature Grans (Abs) 0.0 0.0 - 0.1 x10E3/uL  Comprehensive metabolic panel   Collection Time: 01/31/23  4:23 PM  Result Value Ref Range   Glucose 75 70 - 99 mg/dL   BUN 8 6 - 24 mg/dL   Creatinine,  Ser 2.13 0.57 - 1.00 mg/dL   eGFR 76 >08  mL/min/1.73   BUN/Creatinine Ratio 9 9 - 23   Sodium 140 134 - 144 mmol/L   Potassium 4.3 3.5 - 5.2 mmol/L   Chloride 103 96 - 106 mmol/L   CO2 21 20 - 29 mmol/L   Calcium 8.9 8.7 - 10.2 mg/dL   Total Protein 6.6 6.0 - 8.5 g/dL   Albumin 4.2 3.9 - 4.9 g/dL   Globulin, Total 2.4 1.5 - 4.5 g/dL   Albumin/Globulin Ratio 1.8 1.2 - 2.2   Bilirubin Total 0.8 0.0 - 1.2 mg/dL   Alkaline Phosphatase 72 44 - 121 IU/L   AST 15 0 - 40 IU/L   ALT 23 0 - 32 IU/L  Lipid Panel w/o Chol/HDL Ratio   Collection Time: 01/31/23  4:23 PM  Result Value Ref Range   Cholesterol, Total 153 100 - 199 mg/dL   Triglycerides 61 0 - 149 mg/dL   HDL 73 >16 mg/dL   VLDL Cholesterol Cal 12 5 - 40 mg/dL   LDL Chol Calc (NIH) 68 0 - 99 mg/dL  TSH   Collection Time: 01/31/23  4:23 PM  Result Value Ref Range   TSH 1.410 0.450 - 4.500 uIU/mL  VITAMIN D  25 Hydroxy (Vit-D Deficiency, Fractures)   Collection Time: 01/31/23  4:23 PM  Result Value Ref Range   Vit D, 25-Hydroxy 29.1 (L) 30.0 - 100.0 ng/mL  B12   Collection Time: 01/31/23  4:23 PM  Result Value Ref Range   Vitamin B-12 1,644 (H) 232 - 1,245 pg/mL      Assessment & Plan:   Problem List Items Addressed This Visit       Nervous and Auditory   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 Will treat with gabapentin. Call with any concerns. Follow up in 1 month.      Relevant Orders   Ambulatory referral to Neurosurgery     Follow up plan: Return in about 4 weeks (around 03/24/2024) for physical.

## 2024-03-09 NOTE — Progress Notes (Unsigned)
 Referring Physician:  Solomon Dupre, DO [address] [place],  [place] [address]  Primary Physician:  Solomon Dupre, DO  History of Present Illness: 03/10/2024 [name] has a history of asthma. She is s/p bariatric surgery.   She had WC injury and was seeing [facility]. She improved and case was closed in July 2024.   Then she had new fall in October at work and that is when her current back and left leg pain started. This was under WC. They denied her claim after surgery was recommended. She has an Pensions consultant and is fighting it.   Per PCP note, she was seeing [facility] and told she needed lumbar surgery. Notes from [facility] reference a large HNP L4-L5. Was referred to Dr. Ellery Guthrie by Dr. Dante Dyer.   She has 7 month history of constant LBP with left posterior leg pain to her foot. She has numbness, tingling, and weakness. She has intermittent right buttock pain,no leg pain. She notes more frequent cramps in her left foot. Pain is worse with prolonged sitting, standing, and walking. Some relief with rest and heat.   She is taking neurontin  and zanaflex .   She does not smoke.   Bowel/Bladder Dysfunction: she has urinary urgency, no perineal numbness.   Conservative measures:  Physical therapy:  PT at [facility] from 10/17/23 to 11/10/23- she was discharged as it was not helping  Multimodal medical therapy including regular antiinflammatories:  tylenol , gabapentin , tizanidine , medrol  dose pack  Injections:   [facility] lumbar [facility]   Past Surgery: no spinal surgeries  Haley Garza has no symptoms of cervical myelopathy.  The symptoms are causing a significant impact on the patient's life.   Review of Systems:  A 10 point review of systems is negative, except for the pertinent positives and negatives detailed in the HPI.  Past Medical History: Past Medical History:  Diagnosis Date   Anemia    Anxiety    Arthritis    Depression     History of kidney stones    IFG (impaired fasting glucose)    Obesity    PONV (postoperative nausea and vomiting)    Tobacco abuse     Past Surgical History: Past Surgical History:  Procedure Laterality Date   BARIATRIC SURGERY     BREAST BIOPSY Right 03/22/2021   us  bx of mass, venus marker, path pending   CESAREAN SECTION     x 2    COLONOSCOPY WITH PROPOFOL  N/A 02/07/2023   Procedure: COLONOSCOPY WITH PROPOFOL ;  Surgeon: Luke Salaam, MD;  Location: [facility] ENDOSCOPY;  Service: Gastroenterology;  Laterality: N/A;   CYSTOSCOPY N/A 06/18/2017   Procedure: CYSTOSCOPY;  Surgeon: Darl Edu, MD;  Location: [facility] ORS;  Service: Gynecology;  Laterality: N/A;   INTRAUTERINE DEVICE (IUD) INSERTION     feb 2014   KNEE ARTHROSCOPY Right    LAPAROSCOPIC HYSTERECTOMY N/A 06/18/2017   Procedure: HYSTERECTOMY TOTAL LAPAROSCOPIC;  Surgeon: Darl Edu, MD;  Location: [facility] ORS;  Service: Gynecology;  Laterality: N/A;   TOTAL ABDOMINAL HYSTERECTOMY     TUBAL LIGATION     UTERINE FIBROID EMBOLIZATION  01/09/2017   Triangular vascular    Allergies: Allergies as of 03/10/2024 - Review Complete 03/10/2024  Allergen Reaction Noted   Honey bee treatment [bee venom] Itching and Swelling 06/12/2017   Oxycodone -acetaminophen  Hives and Itching 09/14/2015    Medications: Outpatient Encounter Medications as of 03/10/2024  Medication Sig   b complex vitamins  tablet Take 1 tablet by mouth daily.   Biotin 5000 MCG CAPS Take 1 capsule by mouth daily.   Cholecalciferol (VITAMIN D3) 5000 units CAPS Take 5,000 Units by mouth daily.   ferrous sulfate 325 (65 FE) MG tablet Take 65 mg by mouth daily with breakfast.   gabapentin  (NEURONTIN ) 100 MG capsule Start taking 1 pill at bedtime for 1 week, then 1 pill BID for 1 week, then 1 pill TID after that   Multiple Vitamin (MULTIVITAMIN) tablet Take 1 tablet by mouth daily.   tiZANidine  (ZANAFLEX ) 4 MG tablet Take 1 tablet (4 mg total) by mouth every 8  (eight) hours as needed for muscle spasms.   vitamin A 8000 UNIT capsule Take 8,000 Units by mouth daily.   vitamin E 200 UNIT capsule Take 200 Units by mouth daily.   VITAMIN K PO Take 100 mcg by mouth daily.   [DISCONTINUED] acetaminophen  (TYLENOL ) 500 MG tablet Take 500 mg by mouth every 6 (six) hours as needed.   [DISCONTINUED] Ascorbic Acid (VITAMIN C) 1000 MG tablet Take 1,000 mg by mouth daily. Chewable (Patient not taking: Reported on 03/10/2024)   [DISCONTINUED] fluticasone  (FLONASE ) 50 MCG/ACT nasal spray Place 1 spray into both nostrils daily. Begin by using 2 sprays in each nare daily for 3 to 5 days, then decrease to 1 spray in each nare daily.   No facility-administered encounter medications on file as of 03/10/2024.    Social History: Social History   Tobacco Use   Smoking status: Former    Types: Cigarettes   Smokeless tobacco: Never   Tobacco comments:    a cigarrete every few days   Vaping Use   Vaping status: Never Used  Substance Use Topics   Alcohol use: Yes    Comment: occ   Drug use: No    Family Medical History: Family History  Problem Relation Age of Onset   Hypertension Mother    Hyperlipidemia Mother    Depression Mother    Fibroids Mother    Breast cancer Neg Hx     Physical Examination: Vitals:   03/10/24 0953  BP: 112/62    General: Patient is well developed, well nourished, calm, collected, and in no apparent distress. Attention to examination is appropriate.  Respiratory: Patient is breathing without any difficulty.   NEUROLOGICAL:     Awake, alert, oriented to person, place, and time.  Speech is clear and fluent. Fund of knowledge is appropriate.   Cranial Nerves: Pupils equal round and reactive to light.  Facial tone is symmetric.    No abnormal lesions on exposed skin.   Strength: Side Biceps Triceps Deltoid Interossei Grip Wrist Ext. Wrist Flex.  R 5 5 5 5 5 5 5   L 5 5 5 5 5 5 5    Side Iliopsoas Quads Hamstring PF DF EHL   R 5 5 5 5 5 5   L 5 5 5 5  4+ 4-   Reflexes are 2+ and symmetric at the biceps, brachioradialis, patella and achilles.   Hoffman's is absent.  Clonus is not present.   Bilateral upper and lower extremity sensation is intact to light touch.     She can heel stand, but has difficulty on left. She can stand on toes on right and left independently, but has difficulty on left.   Gait is slow.   Medical Decision Making  Imaging: Lumbar MRI dated 01/13/24: (report scanned into chart) IMPRESSION:  L4-L5 broad central/left paracentral disc protrusion with mass effect  on the left intraspinal L5 nerve root and mild central canal stenosis.   Electronically signed by Onnie Bilis (Monday, January 13, 2024 8:42.28 AM)   Lumbar xrays dated 10/17/23:  FINDINGS: Lumbar alignment within normal limits. Vertebral body heights are maintained. Minimal degenerative osteophytes and mild facet degenerative change. Patent disc spaces   IMPRESSION: Mild degenerative change.     Electronically Signed   By: Esmeralda Hedge M.D.   On: 11/01/2023 23:48    I have personally reviewed the images and agree with the above interpretation.  Assessment and Plan: [name] has a 7 month history of constant LBP with left posterior leg pain to her foot. She has numbness, tingling, and weakness.   She has known lumbar spondylosis with left paracentral disc at L4-L5 with mass effect on left L5 nerve.   She has some weakness in left EHL and left DF.   Treatment options discussed with patient and following plan made:   - She has failed conservative treatment including PT, time, and medications.  - She has weakness in left foot and I recommend she see Dr. Felipe Horton to discuss further options including surgery.  - Will get lumbar xrays with flex/ext prior to her visit with Dr. Felipe Horton.  - Of note, this is under her private insurance, but she is fighting WC as it was initially covered under WC.   I spent a total of 40  minutes in face-to-face and non-face-to-face activities related to this patient's care today including review of outside records, review of imaging, review of symptoms, physical exam, discussion of differential diagnosis, discussion of treatment options, and documentation.   Thank you for involving me in the care of this patient.   Lucetta Russel PA-C Dept. of Neurosurgery

## 2024-03-10 ENCOUNTER — Encounter: Payer: Self-pay | Admitting: Orthopedic Surgery

## 2024-03-10 ENCOUNTER — Inpatient Hospital Stay
Admission: RE | Admit: 2024-03-10 | Discharge: 2024-03-10 | Disposition: A | Discharge status: Home / Self Care | Payer: Self-pay | Source: Ambulatory Visit | Attending: Orthopedic Surgery | Admitting: Orthopedic Surgery

## 2024-03-10 ENCOUNTER — Ambulatory Visit
Admission: RE | Admit: 2024-03-10 | Discharge: 2024-03-10 | Disposition: A | Discharge status: Home / Self Care | Source: Ambulatory Visit | Attending: Orthopedic Surgery | Admitting: Orthopedic Surgery

## 2024-03-10 ENCOUNTER — Ambulatory Visit (INDEPENDENT_AMBULATORY_CARE_PROVIDER_SITE_OTHER): Admitting: Orthopedic Surgery

## 2024-03-10 ENCOUNTER — Ambulatory Visit
Admission: RE | Admit: 2024-03-10 | Discharge: 2024-03-10 | Disposition: A | Discharge status: Home / Self Care | Attending: Orthopedic Surgery | Admitting: Orthopedic Surgery

## 2024-03-10 VITALS — BP 112/62 | Ht 66.0 in | Wt 229.0 lb

## 2024-03-10 DIAGNOSIS — M47816 Spondylosis without myelopathy or radiculopathy, lumbar region: Secondary | ICD-10-CM | POA: Diagnosis present

## 2024-03-10 DIAGNOSIS — M4726 Other spondylosis with radiculopathy, lumbar region: Secondary | ICD-10-CM

## 2024-03-10 DIAGNOSIS — M5126 Other intervertebral disc displacement, lumbar region: Secondary | ICD-10-CM | POA: Diagnosis not present

## 2024-03-10 DIAGNOSIS — Z049 Encounter for examination and observation for unspecified reason: Secondary | ICD-10-CM

## 2024-03-10 DIAGNOSIS — R29898 Other symptoms and signs involving the musculoskeletal system: Secondary | ICD-10-CM | POA: Insufficient documentation

## 2024-03-10 DIAGNOSIS — M5416 Radiculopathy, lumbar region: Secondary | ICD-10-CM

## 2024-03-10 NOTE — Patient Instructions (Signed)
 It was so nice to see you today. Thank you so much for coming in.    You have a disc herniation at L4-L5 and I think this is causing your left leg pain and the weakness in your left foot.   I ordered xrays of your lower back. You can get these at [facility] (building with the white pillars) off of [address]. The address is [address] [place], [place] [address]. You do not need any appointment.   I want you to see Dr. Henderson Lock to discuss further options including surgery.   Please do not hesitate to call if you have any questions or concerns. You can also message me in MyChart.   Lucetta Russel PA-C [phone]     The physicians and staff at [facility] are committed to providing excellent care. You may receive a survey asking for feedback about your experience at our office. We value you your feedback and appreciate you taking the time to to fill it out. The [facility] leadership team is also available to discuss your experience in person, feel free to contact us  [phone].

## 2024-03-11 ENCOUNTER — Ambulatory Visit (INDEPENDENT_AMBULATORY_CARE_PROVIDER_SITE_OTHER)

## 2024-03-11 DIAGNOSIS — M722 Plantar fascial fibromatosis: Secondary | ICD-10-CM

## 2024-03-11 DIAGNOSIS — Q666 Other congenital valgus deformities of feet: Secondary | ICD-10-CM

## 2024-03-11 NOTE — Progress Notes (Signed)
 Patient presents today to pick up custom molded foot orthotics, diagnosed with Plantar fasciitis and Pes Planovalgus by Dr. Clydia Dart.   Orthotics were dispensed and fit was satisfactory. Reviewed instructions for break-in and wear. Written instructions given to patient.  Patient will follow up as needed.   Britton Cane Cped, CFo, CFm

## 2024-03-12 NOTE — H&P (View-Only) (Signed)
 Referring Physician:  Solomon Dupre, DO [address] [place],  [place] [address]  Primary Physician:  Solomon Dupre, DO  History of Present Illness: 03/16/2024 [name] has a history of asthma. She is s/p bariatric surgery.  She has been dealing with back issues for approximately 2 years since an injury, however has had a worsening over the past 6 to 7 months when she had another fall at work.  She developed significant left lower extremity pain.  She has had a workup including MRI which diagnosed her with a lumbar disc herniation after it was noted that she had a foot drop in clinic.  She does feel like this has been worsening over the past 3 months.  She has had physical therapy, continues to have weakness.  Feels like she is starting to hurt her contralateral foot from her gait changes.  She is taking neurontin  and zanaflex .   She does not smoke.   Bowel/Bladder Dysfunction: she has urinary urgency, no perineal numbness.   Conservative measures:  Physical therapy:  PT at [facility] from 10/17/23 to 11/10/23- she was discharged as it was not helping  Multimodal medical therapy including regular antiinflammatories:  tylenol , gabapentin , tizanidine , medrol  dose pack  Injections:   Had lumbar MBB/RFA at [facility] April  Past Surgery: no spinal surgeries  Haley Garza has no symptoms of cervical myelopathy.  The symptoms are causing a significant impact on the patient's life.   Review of Systems:  A 10 point review of systems is negative, except for the pertinent positives and negatives detailed in the HPI.  Past Medical History: Past Medical History:  Diagnosis Date   Anemia    Anxiety    Arthritis    Depression    History of kidney stones    IFG (impaired fasting glucose)    Obesity    PONV (postoperative nausea and vomiting)    Tobacco abuse     Past Surgical History: Past Surgical History:  Procedure Laterality Date   BARIATRIC SURGERY      BREAST BIOPSY Right 03/22/2021   us  bx of mass, venus marker, path pending   CESAREAN SECTION     x 2    COLONOSCOPY WITH PROPOFOL  N/A 02/07/2023   Procedure: COLONOSCOPY WITH PROPOFOL ;  Surgeon: Luke Salaam, MD;  Location: [facility] ENDOSCOPY;  Service: Gastroenterology;  Laterality: N/A;   CYSTOSCOPY N/A 06/18/2017   Procedure: CYSTOSCOPY;  Surgeon: Darl Edu, MD;  Location: [facility] ORS;  Service: Gynecology;  Laterality: N/A;   INTRAUTERINE DEVICE (IUD) INSERTION     feb 2014   KNEE ARTHROSCOPY Right    LAPAROSCOPIC HYSTERECTOMY N/A 06/18/2017   Procedure: HYSTERECTOMY TOTAL LAPAROSCOPIC;  Surgeon: Darl Edu, MD;  Location: [facility] ORS;  Service: Gynecology;  Laterality: N/A;   TOTAL ABDOMINAL HYSTERECTOMY     TUBAL LIGATION     UTERINE FIBROID EMBOLIZATION  01/09/2017   Triangular vascular    Allergies: Allergies as of 03/16/2024 - Review Complete 03/16/2024  Allergen Reaction Noted   Honey bee treatment [bee venom] Itching and Swelling 06/12/2017   Oxycodone -acetaminophen  Hives and Itching 09/14/2015    Medications: Outpatient Encounter Medications as of 03/16/2024  Medication Sig   b complex vitamins tablet Take 1 tablet by mouth daily.   Biotin 5000 MCG CAPS Take 1 capsule by mouth daily.   Cholecalciferol (VITAMIN D3) 5000 units CAPS Take 5,000 Units by mouth daily.   ferrous sulfate 325 (65 FE) MG tablet Take 65 mg  by mouth daily with breakfast.   gabapentin  (NEURONTIN ) 100 MG capsule Start taking 1 pill at bedtime for 1 week, then 1 pill BID for 1 week, then 1 pill TID after that   Multiple Vitamin (MULTIVITAMIN) tablet Take 1 tablet by mouth daily.   tiZANidine  (ZANAFLEX ) 4 MG tablet Take 1 tablet (4 mg total) by mouth every 8 (eight) hours as needed for muscle spasms.   vitamin A 8000 UNIT capsule Take 8,000 Units by mouth daily.   vitamin E 200 UNIT capsule Take 200 Units by mouth daily.   VITAMIN K PO Take 100 mcg by mouth daily.   No facility-administered  encounter medications on file as of 03/16/2024.    Social History: Social History   Tobacco Use   Smoking status: Former    Types: Cigarettes   Smokeless tobacco: Never   Tobacco comments:    a cigarrete every few days   Vaping Use   Vaping status: Never Used  Substance Use Topics   Alcohol use: Yes    Comment: occ   Drug use: No    Family Medical History: Family History  Problem Relation Age of Onset   Hypertension Mother    Hyperlipidemia Mother    Depression Mother    Fibroids Mother    Breast cancer Neg Hx     Physical Examination: Vitals:   03/16/24 0857  BP: 108/72    General: Patient is well developed, well nourished, calm, collected, and in no apparent distress. Attention to examination is appropriate.  Respiratory: Patient is breathing without any difficulty.   NEUROLOGICAL:     Awake, alert, oriented to person, place, and time.  Speech is clear and fluent. Fund of knowledge is appropriate.   Cranial Nerves: Pupils equal round and reactive to light.  Facial tone is symmetric.    No abnormal lesions on exposed skin.   Strength:  Side Iliopsoas Quads Hamstring PF DF EHL  R 5 5 5 5 5 5   L 5 5 5 5  4- 3   Reflexes are 2+ and symmetric at the biceps, brachioradialis, patella and achilles there is a slight decrease noted in her left medial hamstrings reflex.   Hoffman's is absent.  Clonus is not present.   Bilateral upper and lower extremity sensation is intact to light touch.     Gait is slow.   Medical Decision Making  Imaging: Lumbar MRI dated 01/13/24: (report scanned into chart) IMPRESSION:  L4-L5 broad central/left paracentral disc protrusion with mass effect on the left intraspinal L5 nerve root and mild central canal stenosis.   Electronically signed by Onnie Bilis (Monday, January 13, 2024 8:42.28 AM)   Lumbar xrays dated 10/17/23:  FINDINGS: Lumbar alignment within normal limits. Vertebral body heights are maintained. Minimal  degenerative osteophytes and mild facet degenerative change. Patent disc spaces   IMPRESSION: Mild degenerative change.     Electronically Signed   By: Esmeralda Hedge M.D.   On: 11/01/2023 23:48    I have personally reviewed the images and agree with the above interpretation.  Assessment and Plan: [name] has a 7 month history of constant LBP with left posterior leg pain to her foot. She has numbness, tingling, and weakness.  She has been dealing with this for the past 2 years but has had a worsening over the past 7 months after a fall at work.  She has had radicular symptoms ever since, has noticed that her weakness has gotten worse over the past 2 to  3 months.  Has had physical therapy and since been discharged.  MRI demonstrates a left-sided herniated disc at L4-5, her symptoms are consistent with an L5 radiculopathy, she has mild medial hamstrings reflex blunting and pain going down her leg in a L5 distribution.  Positive straight leg raise.  Weakness in dorsiflexion and EHL function as well as thigh abduction.  She is starting to develop podiatry issues on the contralateral foot due to compensation, she is currently having this managed.  She has tried and failed conservative management, here with weakness and a left-sided L4-5 disc herniation with an L5 radiculopathy, given her weakness and failure to improve with conservative management we offered to go forward with a left-sided L4-5 disc herniation, we discussed risks including bleeding, nerve injury, CSF leak, failure to improve her symptoms.  We did discuss that the lack of sensation or numbness is very slow to recover if ever, but that the primary goals of surgery would be to help keep her from developing worsening weakness in the left lower extremity and to help with the ongoing pain.  I evaluated her MRI myself, the disc patient appears accessible through the L4-5 hemilaminotomy corridor, there is T2 signal within the disc herniation  signifying that it is likely acute to subacute at the time of imaging.  I also reviewed her flexion-extension x-rays, she has good motion that appears physiologic at her lumbar spine, no evidence of pathologic slip, candidate for decompression surgery alone.  Thank you for involving me in the care of this patient.   Carroll Clamp, MD Dept. of Neurosurgery

## 2024-03-12 NOTE — Progress Notes (Signed)
 Referring Physician:  Solomon Dupre, DO [address] [place],  [place] [address]  Primary Physician:  Solomon Dupre, DO  History of Present Illness: 03/16/2024 [name] has a history of asthma. She is s/p bariatric surgery.  She has been dealing with back issues for approximately 2 years since an injury, however has had a worsening over the past 6 to 7 months when she had another fall at work.  She developed significant left lower extremity pain.  She has had a workup including MRI which diagnosed her with a lumbar disc herniation after it was noted that she had a foot drop in clinic.  She does feel like this has been worsening over the past 3 months.  She has had physical therapy, continues to have weakness.  Feels like she is starting to hurt her contralateral foot from her gait changes.  She is taking neurontin  and zanaflex .   She does not smoke.   Bowel/Bladder Dysfunction: she has urinary urgency, no perineal numbness.   Conservative measures:  Physical therapy:  PT at [facility] from 10/17/23 to 11/10/23- she was discharged as it was not helping  Multimodal medical therapy including regular antiinflammatories:  tylenol , gabapentin , tizanidine , medrol  dose pack  Injections:   Had lumbar MBB/RFA at [facility] April  Past Surgery: no spinal surgeries  Haley Garza has no symptoms of cervical myelopathy.  The symptoms are causing a significant impact on the patient's life.   Review of Systems:  A 10 point review of systems is negative, except for the pertinent positives and negatives detailed in the HPI.  Past Medical History: Past Medical History:  Diagnosis Date   Anemia    Anxiety    Arthritis    Depression    History of kidney stones    IFG (impaired fasting glucose)    Obesity    PONV (postoperative nausea and vomiting)    Tobacco abuse     Past Surgical History: Past Surgical History:  Procedure Laterality Date   BARIATRIC SURGERY      BREAST BIOPSY Right 03/22/2021   us  bx of mass, venus marker, path pending   CESAREAN SECTION     x 2    COLONOSCOPY WITH PROPOFOL  N/A 02/07/2023   Procedure: COLONOSCOPY WITH PROPOFOL ;  Surgeon: Luke Salaam, MD;  Location: [facility] ENDOSCOPY;  Service: Gastroenterology;  Laterality: N/A;   CYSTOSCOPY N/A 06/18/2017   Procedure: CYSTOSCOPY;  Surgeon: Darl Edu, MD;  Location: [facility] ORS;  Service: Gynecology;  Laterality: N/A;   INTRAUTERINE DEVICE (IUD) INSERTION     feb 2014   KNEE ARTHROSCOPY Right    LAPAROSCOPIC HYSTERECTOMY N/A 06/18/2017   Procedure: HYSTERECTOMY TOTAL LAPAROSCOPIC;  Surgeon: Darl Edu, MD;  Location: [facility] ORS;  Service: Gynecology;  Laterality: N/A;   TOTAL ABDOMINAL HYSTERECTOMY     TUBAL LIGATION     UTERINE FIBROID EMBOLIZATION  01/09/2017   Triangular vascular    Allergies: Allergies as of 03/16/2024 - Review Complete 03/16/2024  Allergen Reaction Noted   Honey bee treatment [bee venom] Itching and Swelling 06/12/2017   Oxycodone -acetaminophen  Hives and Itching 09/14/2015    Medications: Outpatient Encounter Medications as of 03/16/2024  Medication Sig   b complex vitamins tablet Take 1 tablet by mouth daily.   Biotin 5000 MCG CAPS Take 1 capsule by mouth daily.   Cholecalciferol (VITAMIN D3) 5000 units CAPS Take 5,000 Units by mouth daily.   ferrous sulfate 325 (65 FE) MG tablet Take 65 mg  by mouth daily with breakfast.   gabapentin  (NEURONTIN ) 100 MG capsule Start taking 1 pill at bedtime for 1 week, then 1 pill BID for 1 week, then 1 pill TID after that   Multiple Vitamin (MULTIVITAMIN) tablet Take 1 tablet by mouth daily.   tiZANidine  (ZANAFLEX ) 4 MG tablet Take 1 tablet (4 mg total) by mouth every 8 (eight) hours as needed for muscle spasms.   vitamin A 8000 UNIT capsule Take 8,000 Units by mouth daily.   vitamin E 200 UNIT capsule Take 200 Units by mouth daily.   VITAMIN K PO Take 100 mcg by mouth daily.   No facility-administered  encounter medications on file as of 03/16/2024.    Social History: Social History   Tobacco Use   Smoking status: Former    Types: Cigarettes   Smokeless tobacco: Never   Tobacco comments:    a cigarrete every few days   Vaping Use   Vaping status: Never Used  Substance Use Topics   Alcohol use: Yes    Comment: occ   Drug use: No    Family Medical History: Family History  Problem Relation Age of Onset   Hypertension Mother    Hyperlipidemia Mother    Depression Mother    Fibroids Mother    Breast cancer Neg Hx     Physical Examination: Vitals:   03/16/24 0857  BP: 108/72    General: Patient is well developed, well nourished, calm, collected, and in no apparent distress. Attention to examination is appropriate.  Respiratory: Patient is breathing without any difficulty.   NEUROLOGICAL:     Awake, alert, oriented to person, place, and time.  Speech is clear and fluent. Fund of knowledge is appropriate.   Cranial Nerves: Pupils equal round and reactive to light.  Facial tone is symmetric.    No abnormal lesions on exposed skin.   Strength:  Side Iliopsoas Quads Hamstring PF DF EHL  R 5 5 5 5 5 5   L 5 5 5 5  4- 3   Reflexes are 2+ and symmetric at the biceps, brachioradialis, patella and achilles there is a slight decrease noted in her left medial hamstrings reflex.   Hoffman's is absent.  Clonus is not present.   Bilateral upper and lower extremity sensation is intact to light touch.     Gait is slow.   Medical Decision Making  Imaging: Lumbar MRI dated 01/13/24: (report scanned into chart) IMPRESSION:  L4-L5 broad central/left paracentral disc protrusion with mass effect on the left intraspinal L5 nerve root and mild central canal stenosis.   Electronically signed by Onnie Bilis (Monday, January 13, 2024 8:42.28 AM)   Lumbar xrays dated 10/17/23:  FINDINGS: Lumbar alignment within normal limits. Vertebral body heights are maintained. Minimal  degenerative osteophytes and mild facet degenerative change. Patent disc spaces   IMPRESSION: Mild degenerative change.     Electronically Signed   By: Esmeralda Hedge M.D.   On: 11/01/2023 23:48    I have personally reviewed the images and agree with the above interpretation.  Assessment and Plan: Ms. Dauria has a 7 month history of constant LBP with left posterior leg pain to her foot. She has numbness, tingling, and weakness.  She has been dealing with this for the past 2 years but has had a worsening over the past 7 months after a fall at work.  She has had radicular symptoms ever since, has noticed that her weakness has gotten worse over the past 2 to  3 months.  Has had physical therapy and since been discharged.  MRI demonstrates a left-sided herniated disc at L4-5, her symptoms are consistent with an L5 radiculopathy, she has mild medial hamstrings reflex blunting and pain going down her leg in a L5 distribution.  Positive straight leg raise.  Weakness in dorsiflexion and EHL function as well as thigh abduction.  She is starting to develop podiatry issues on the contralateral foot due to compensation, she is currently having this managed.  She has tried and failed conservative management, here with weakness and a left-sided L4-5 disc herniation with an L5 radiculopathy, given her weakness and failure to improve with conservative management we offered to go forward with a left-sided L4-5 disc herniation, we discussed risks including bleeding, nerve injury, CSF leak, failure to improve her symptoms.  We did discuss that the lack of sensation or numbness is very slow to recover if ever, but that the primary goals of surgery would be to help keep her from developing worsening weakness in the left lower extremity and to help with the ongoing pain.  I evaluated her MRI myself, the disc patient appears accessible through the L4-5 hemilaminotomy corridor, there is T2 signal within the disc herniation  signifying that it is likely acute to subacute at the time of imaging.  I also reviewed her flexion-extension x-rays, she has good motion that appears physiologic at her lumbar spine, no evidence of pathologic slip, candidate for decompression surgery alone.  Thank you for involving me in the care of this patient.   Carroll Clamp, MD Dept. of Neurosurgery

## 2024-03-16 ENCOUNTER — Ambulatory Visit: Admitting: Neurosurgery

## 2024-03-16 ENCOUNTER — Other Ambulatory Visit: Payer: Self-pay

## 2024-03-16 ENCOUNTER — Encounter: Payer: Self-pay | Admitting: Neurosurgery

## 2024-03-16 VITALS — BP 108/72 | Ht 66.0 in | Wt 225.0 lb

## 2024-03-16 DIAGNOSIS — M5126 Other intervertebral disc displacement, lumbar region: Secondary | ICD-10-CM | POA: Diagnosis not present

## 2024-03-16 DIAGNOSIS — R29898 Other symptoms and signs involving the musculoskeletal system: Secondary | ICD-10-CM | POA: Insufficient documentation

## 2024-03-16 DIAGNOSIS — M5416 Radiculopathy, lumbar region: Secondary | ICD-10-CM | POA: Insufficient documentation

## 2024-03-16 DIAGNOSIS — Z01818 Encounter for other preprocedural examination: Secondary | ICD-10-CM

## 2024-03-16 NOTE — Patient Instructions (Signed)
 Please see below for information in regards to your upcoming surgery:   Planned surgery: Left 4-5 microdiscectomy   Surgery date: 03/31/2024  at [facility] (Medical Mall: [address] [place], [place] [address]) - you will find out your arrival time the business day before your surgery.   Pre-op appointment at [facility] Pre-admit Testing: you will receive a call with a date/time for this appointment. If you are scheduled for an in person appointment, Pre-admit Testing is located on the first floor of the [facility] building, [address], [address]. During this appointment, they will advise you which medications you can take the morning of surgery, and which medications you will need to hold for surgery. Labs (such as blood work, EKG) may be done at your pre-op appointment. You are not required to fast for these labs. Should you need to change your pre-op appointment, please call Pre-admit testing at [phone].    Common restrictions after surgery: No bending, lifting, or twisting ("BLT"). Avoid lifting objects heavier than 10 pounds for the first 6 weeks after surgery. Where possible, avoid household activities that involve lifting, bending, reaching, pushing, or pulling such as laundry, vacuuming, grocery shopping, and childcare. Try to arrange for help from friends and family for these activities while you heal. Do not drive while taking prescription pain medication. Weeks 6 through 12 after surgery: avoid lifting more than 25 pounds.    How to contact us :  If you have any questions/concerns before or after surgery, you can reach us  at [phone], or you can send a mychart message. We can be reached by phone or mychart 8am-4pm, Monday-Friday.  *Please note: Calls after 4pm are forwarded to a third party answering service. Mychart messages are not routinely monitored during evenings, weekends, and holidays. Please call our office to contact the  answering service for urgent concerns during non-business hours.   If you have FMLA/disability paperwork, please drop it off or fax it to [phone], attention Patty.   Appointments/FMLA & disability paperwork: Gerlean Kocher, & Maryann Smalls Registered Nurses/Surgery schedulers: Kendelyn & Solace Manwarren Medical Assistants: Donnajean Fuse Physician Assistants: Ludwig Safer, PA-C, Anastacio Karvonen, PA-C & Lucetta Russel, PA-C Surgeons: Jodeen Munch, MD & Henderson Lock, MD   [facility] PREADMIT TESTING VISIT and SURGERY INFORMATION SHEET   Now that surgery has been scheduled you can anticipate several phone calls from [facility] services. A pharmacy technician will call you to verify your current list of medications taken at home.               The Pre-Service Center will call to verify your insurance information and to give you billing estimates and information.             The Preadmit Testing Office will be calling to schedule a visit to obtain information for the anesthesia team and provide instructions on preparation for surgery.  What can you expect for the Preadmit Testing Visit: Appointments may be scheduled in-person or by telephone.  If a telephone visit is scheduled, you may be asked to come into the office to have lab tests or other studies performed.   This visit will not be completed any greater than 14 days prior to your surgery.  If your surgery has been scheduled for a future date, please do not be alarmed if we have not contacted you to schedule an appointment more than a month prior to the surgery date.    Please be  prepared to provide the following information during this appointment:            -Personal medical history                                               -Medication and allergy list            -Any history of problems with anesthesia              -Recent lab work or diagnostic studies            -Please notify us  of any needs we should be aware of  to provide the best care possible           -You will be provided with instructions on how to prepare for your surgery.    On The Day of Surgery:  You must have a driver to take you home after surgery, you will be asked not to drive for 24 hours following surgery.  Taxi, [employer] and non-medical transport will not be acceptable means of transportation unless you have a responsible individual who will be traveling with you.  Visitors in the surgical area:   2 people will be able to visit you in your room once your preparation for surgery has been completed. During surgery, your visitors will be asked to wait in the Surgery Waiting Area.  It is not a requirement for them to stay, if they prefer to leave and come back.  Your visitor(s) will be given an update once the surgery has been completed.  No visitors are allowed in the initial recovery room to respect patient privacy and safety.  Once you are more awake and transfer to the secondary recovery area, or are transferred to an inpatient room, visitors will again be able to see you.  To respect and protect your privacy: We will ask on the day of surgery who your driver will be and what the contact number for that individual will be. We will ask if it is okay to share information with this individual, or if there is an alternative individual that we, or the surgeon, should contact to provide updates and information. If family or friends come to the surgical information desk requesting information about you, who you have not listed with us , no information will be given.   It may be helpful to designate someone as the main contact who will be responsible for updating your other friends and family.    PREADMIT TESTING OFFICE: [phone] SAME DAY SURGERY: [phone] We look forward to caring for you before and throughout the process of your surgery.

## 2024-03-19 ENCOUNTER — Ambulatory Visit: Admitting: Family Medicine

## 2024-03-26 ENCOUNTER — Other Ambulatory Visit: Payer: Self-pay

## 2024-03-26 ENCOUNTER — Encounter
Admission: RE | Admit: 2024-03-26 | Discharge: 2024-03-26 | Disposition: A | Discharge status: Home / Self Care | Source: Ambulatory Visit | Attending: Neurosurgery | Admitting: Neurosurgery

## 2024-03-26 VITALS — BP 123/75 | HR 56 | Resp 16 | Ht 67.0 in | Wt 228.2 lb

## 2024-03-26 DIAGNOSIS — R001 Bradycardia, unspecified: Secondary | ICD-10-CM | POA: Insufficient documentation

## 2024-03-26 DIAGNOSIS — Z01812 Encounter for preprocedural laboratory examination: Secondary | ICD-10-CM

## 2024-03-26 DIAGNOSIS — D649 Anemia, unspecified: Secondary | ICD-10-CM | POA: Insufficient documentation

## 2024-03-26 DIAGNOSIS — Z01818 Encounter for other preprocedural examination: Secondary | ICD-10-CM | POA: Insufficient documentation

## 2024-03-26 HISTORY — DX: Spondylosis without myelopathy or radiculopathy, lumbar region: M47.816

## 2024-03-26 HISTORY — DX: Other symptoms and signs involving the musculoskeletal system: R29.898

## 2024-03-26 HISTORY — DX: Unspecified asthma, uncomplicated: J45.909

## 2024-03-26 HISTORY — DX: Prediabetes: R73.03

## 2024-03-26 HISTORY — DX: Other intervertebral disc displacement, lumbar region: M51.26

## 2024-03-26 HISTORY — DX: Radiculopathy, lumbar region: M54.16

## 2024-03-26 LAB — BASIC METABOLIC PANEL WITH GFR
Anion gap: 10 (ref 5–15)
BUN: 9 mg/dL (ref 6–20)
CO2: 25 mmol/L (ref 22–32)
Calcium: 8.6 mg/dL — ABNORMAL LOW (ref 8.9–10.3)
Chloride: 106 mmol/L (ref 98–111)
Creatinine, Ser: 0.86 mg/dL (ref 0.44–1.00)
GFR, Estimated: >60 mL/min (ref >60)
Glucose, Bld: 48 mg/dL — ABNORMAL LOW (ref 70–99)
Potassium: 3.5 mmol/L (ref 3.5–5.1)
Sodium: 141 mmol/L (ref 135–145)

## 2024-03-26 LAB — TYPE AND SCREEN
ABO/RH(D): A POS
Antibody Screen: NEGATIVE

## 2024-03-26 LAB — URINALYSIS, COMPLETE (UACMP) WITH MICROSCOPIC
Bilirubin Urine: NEGATIVE
Glucose, UA: NEGATIVE mg/dL
Ketones, ur: NEGATIVE mg/dL
Leukocytes,Ua: NEGATIVE
Nitrite: NEGATIVE
Protein, ur: NEGATIVE mg/dL
Specific Gravity, Urine: 1.003 — ABNORMAL LOW (ref 1.005–1.030)
pH: 5 (ref 5.0–8.0)

## 2024-03-26 LAB — CBC
HCT: 44.9 % (ref 36.0–46.0)
Hemoglobin: 14.3 g/dL (ref 12.0–15.0)
MCH: 31.8 pg (ref 26.0–34.0)
MCHC: 31.8 g/dL (ref 30.0–36.0)
MCV: 100 fL (ref 80.0–100.0)
Platelets: 292 10*3/uL (ref 150–400)
RBC: 4.49 MIL/uL (ref 3.87–5.11)
RDW: 13 % (ref 11.5–15.5)
WBC: 6.3 10*3/uL (ref 4.0–10.5)
nRBC: 0 % (ref 0.0–0.2)

## 2024-03-26 LAB — SURGICAL PCR SCREEN
MRSA, PCR: NEGATIVE
Staphylococcus aureus: NEGATIVE

## 2024-03-26 NOTE — Patient Instructions (Addendum)
 Your procedure is scheduled on: 03/31/24 - Tuesday Report to the Registration Desk on the 1st floor of the [facility]. To find out your arrival time, please call [phone] between 1PM - 3PM on: 03/30/24 - Monday If your arrival time is 6:00 am, do not arrive before that time as the [facility] entrance doors do not open until 6:00 am.  REMEMBER: Instructions that are not followed completely may result in serious medical risk, up to and including death; or upon the discretion of your surgeon and anesthesiologist your surgery may need to be rescheduled.  Do not eat food after midnight the night before surgery.  No gum chewing or hard candies.  You may however, drink CLEAR liquids up to 2 hours before you are scheduled to arrive for your surgery. Do not drink anything within 2 hours of your scheduled arrival time.  Clear liquids include: - water  - apple juice without pulp - gatorade (not RED colors) - black coffee or tea (Do NOT add milk or creamers to the coffee or tea) Do NOT drink anything that is not on this list.  You may continue these medications as needed,  Anti-inflammatories (NSAIDS) such as Advil , Aleve , Ibuprofen , Motrin , Naproxen , Naprosyn  and Aspirin based products such as Excedrin, Goody's Powder, BC Powder.  Stop ANY OVER THE COUNTER supplements until after surgery : b complex , Biotin ,MULTIVITAMIN ,Vitamin a, d, e & k Gummy .  You may take Tylenol  if needed for pain up until the day of surgery.  Continue taking all of your other prescription medications up until the day before surgery.  ON THE DAY OF SURGERY ONLY TAKE THESE MEDICATIONS WITH SIPS OF WATER:  gabapentin  (NEURONTIN )    No Alcohol for 24 hours before or after surgery.  No Smoking including e-cigarettes for 24 hours before surgery.  No chewable tobacco products for at least 6 hours before surgery.  No nicotine patches on the day of surgery.  Do not use any "recreational" drugs for at least a  week (preferably 2 weeks) before your surgery.  Please be advised that the combination of cocaine and anesthesia may have negative outcomes, up to and including death. If you test positive for cocaine, your surgery will be cancelled.  On the morning of surgery brush your teeth with toothpaste and water, you may rinse your mouth with mouthwash if you wish. Do not swallow any toothpaste or mouthwash.  Use CHG Soap or wipes as directed on instruction sheet.  Do not wear jewelry, make-up, hairpins, clips or nail polish.  For welded (permanent) jewelry: bracelets, anklets, waist bands, etc.  Please have this removed prior to surgery.  If it is not removed, there is a chance that hospital personnel will need to cut it off on the day of surgery.  Do not wear lotions, powders, or perfumes.   Contact lenses, hearing aids and dentures may not be worn into surgery.  Do not bring valuables to the hospital. [facility] is not responsible for any missing/lost belongings or valuables.   Notify your doctor if there is any change in your medical condition (cold, fever, infection).  Wear comfortable clothing (specific to your surgery type) to the hospital.  After surgery, you can help prevent lung complications by doing breathing exercises.  Take deep breaths and cough every 1-2 hours. Your doctor may order a device called an Incentive Spirometer to help you take deep breaths. When coughing or sneezing, hold a pillow firmly against your incision with  both hands. This is called "splinting." Doing this helps protect your incision. It also decreases belly discomfort.  If you are being admitted to the hospital overnight, leave your suitcase in the car. After surgery it may be brought to your room.  In case of increased patient census, it may be necessary for you, the patient, to continue your postoperative care in the Same Day Surgery department.  If you are being discharged the day of surgery, you will not  be allowed to drive home. You will need a responsible individual to drive you home and stay with you for 24 hours after surgery.   If you are taking public transportation, you will need to have a responsible individual with you.  Please call the Pre-admissions Testing Dept. at [phone] if you have any questions about these instructions.  Surgery Visitation Policy:  Patients having surgery or a procedure may have two visitors.  Children under the age of 52 must have an adult with them who is not the patient.  Inpatient Visitation:    Visiting hours are 7 a.m. to 8 p.m. Up to four visitors are allowed at one time in a patient room. The visitors may rotate out with other people during the day.  One visitor age 60 or older may stay with the patient overnight and must be in the room by 8 p.m.    Pre-operative 5 CHG Bath Instructions   You can play a key role in reducing the risk of infection after surgery. Your skin needs to be as free of germs as possible. You can reduce the number of germs on your skin by washing with CHG (chlorhexidine  gluconate) soap before surgery. CHG is an antiseptic soap that kills germs and continues to kill germs even after washing.   DO NOT use if you have an allergy to chlorhexidine /CHG or antibacterial soaps. If your skin becomes reddened or irritated, stop using the CHG and notify one of our RNs at [phone].   Please shower with the CHG soap starting 4 days before surgery using the following schedule: 05/30 - 06/03.  Please keep in mind the following:  DO NOT shave, including legs and underarms, starting the day of your first shower.   You may shave your face at any point before/day of surgery.  Place clean sheets on your bed the day you start using CHG soap. Use a clean washcloth (not used since being washed) for each shower. DO NOT sleep with pets once you start using the CHG.   CHG Shower Instructions:  If you choose to wash your hair and  private area, wash first with your normal shampoo/soap.  After you use shampoo/soap, rinse your hair and body thoroughly to remove shampoo/soap residue.  Turn the water OFF and apply about 3 tablespoons (45 ml) of CHG soap to a CLEAN washcloth.  Apply CHG soap ONLY FROM YOUR NECK DOWN TO YOUR TOES (washing for 3-5 minutes)  DO NOT use CHG soap on face, private areas, open wounds, or sores.  Pay special attention to the area where your surgery is being performed.  If you are having back surgery, having someone wash your back for you may be helpful. Wait 2 minutes after CHG soap is applied, then you may rinse off the CHG soap.  Pat dry with a clean towel  Put on clean clothes/pajamas   If you choose to wear lotion, please use ONLY the CHG-compatible lotions on the back of this paper.  Additional instructions for the day of surgery: DO NOT APPLY any lotions, deodorants, cologne, or perfumes.   Put on clean/comfortable clothes.  Brush your teeth.  Ask your nurse before applying any prescription medications to the skin.      CHG Compatible Lotions   Aveeno Moisturizing lotion  Cetaphil Moisturizing Cream  Cetaphil Moisturizing Lotion  Clairol Herbal Essence Moisturizing Lotion, Dry Skin  Clairol Herbal Essence Moisturizing Lotion, Extra Dry Skin  Clairol Herbal Essence Moisturizing Lotion, Normal Skin  Curel Age Defying Therapeutic Moisturizing Lotion with Alpha Hydroxy  Curel Extreme Care Body Lotion  Curel Soothing Hands Moisturizing Hand Lotion  Curel Therapeutic Moisturizing Cream, Fragrance-Free  Curel Therapeutic Moisturizing Lotion, Fragrance-Free  Curel Therapeutic Moisturizing Lotion, Original Formula  Eucerin Daily Replenishing Lotion  Eucerin Dry Skin Therapy Plus Alpha Hydroxy Crme  Eucerin Dry Skin Therapy Plus Alpha Hydroxy Lotion  Eucerin Original Crme  Eucerin Original Lotion  Eucerin Plus Crme Eucerin Plus Lotion  Eucerin TriLipid Replenishing Lotion  Keri  Anti-Bacterial Hand Lotion  Keri Deep Conditioning Original Lotion Dry Skin Formula Softly Scented  Keri Deep Conditioning Original Lotion, Fragrance Free Sensitive Skin Formula  Keri Lotion Fast Absorbing Fragrance Free Sensitive Skin Formula  Keri Lotion Fast Absorbing Softly Scented Dry Skin Formula  Keri Original Lotion  Keri Skin Renewal Lotion Keri Silky Smooth Lotion  Keri Silky Smooth Sensitive Skin Lotion  Nivea Body Creamy Conditioning Oil  Nivea Body Extra Enriched Teacher, adult education Moisturizing Lotion Nivea Crme  Nivea Skin Firming Lotion  NutraDerm 30 Skin Lotion  NutraDerm Skin Lotion  NutraDerm Therapeutic Skin Cream  NutraDerm Therapeutic Skin Lotion  ProShield Protective Hand Cream  Provon moisturizing lotion

## 2024-03-27 ENCOUNTER — Telehealth: Payer: Self-pay | Admitting: Orthopedic Surgery

## 2024-03-27 NOTE — Telephone Encounter (Signed)
 Preop labs from yesterday show low glucose of 48. Chart with history of impaired fasting glucose.   Please call her and make sure she is feeling okay and not having symptoms of hypoglycemia. If so, do to UC/ED.   If she can, she should check her blood sugar at home. If not, make she is eating well and staying hydrated.

## 2024-03-27 NOTE — Telephone Encounter (Signed)
 I spoke with the patient and informed her that her blood sugar was low on her labs yesterday. She states she is feeling fine at the time of our call. She states she hadn't eaten prior to her labs yesterday. She reports she does not have a way to check her blood sugar at home, but does not recall having any issues in the past. She denies any symptoms of dizziness, lightheadedness, or feeling "shaky" at this time.

## 2024-03-30 ENCOUNTER — Other Ambulatory Visit: Payer: Self-pay | Admitting: Medical Genetics

## 2024-03-31 ENCOUNTER — Ambulatory Visit

## 2024-03-31 ENCOUNTER — Telehealth: Payer: Self-pay | Admitting: Neurosurgery

## 2024-03-31 ENCOUNTER — Other Ambulatory Visit: Payer: Self-pay | Admitting: Neurosurgery

## 2024-03-31 ENCOUNTER — Other Ambulatory Visit: Payer: Self-pay

## 2024-03-31 ENCOUNTER — Ambulatory Visit
Admission: RE | Admit: 2024-03-31 | Discharge: 2024-03-31 | Disposition: A | Discharge status: Home / Self Care | Attending: Neurosurgery | Admitting: Neurosurgery

## 2024-03-31 ENCOUNTER — Encounter: Payer: Self-pay | Admitting: Neurosurgery

## 2024-03-31 ENCOUNTER — Ambulatory Visit: Payer: Self-pay | Admitting: Urgent Care

## 2024-03-31 ENCOUNTER — Encounter
Admission: RE | Disposition: A | Discharge status: Home / Self Care | Payer: Self-pay | Source: Home / Self Care | Attending: Neurosurgery

## 2024-03-31 DIAGNOSIS — M5126 Other intervertebral disc displacement, lumbar region: Secondary | ICD-10-CM | POA: Diagnosis present

## 2024-03-31 DIAGNOSIS — J45909 Unspecified asthma, uncomplicated: Secondary | ICD-10-CM | POA: Diagnosis not present

## 2024-03-31 DIAGNOSIS — R29898 Other symptoms and signs involving the musculoskeletal system: Secondary | ICD-10-CM | POA: Diagnosis present

## 2024-03-31 DIAGNOSIS — Z87891 Personal history of nicotine dependence: Secondary | ICD-10-CM | POA: Insufficient documentation

## 2024-03-31 DIAGNOSIS — M21372 Foot drop, left foot: Secondary | ICD-10-CM | POA: Diagnosis not present

## 2024-03-31 DIAGNOSIS — F32A Depression, unspecified: Secondary | ICD-10-CM | POA: Diagnosis not present

## 2024-03-31 DIAGNOSIS — E669 Obesity, unspecified: Secondary | ICD-10-CM | POA: Diagnosis not present

## 2024-03-31 DIAGNOSIS — F419 Anxiety disorder, unspecified: Secondary | ICD-10-CM | POA: Diagnosis not present

## 2024-03-31 DIAGNOSIS — Z79899 Other long term (current) drug therapy: Secondary | ICD-10-CM | POA: Insufficient documentation

## 2024-03-31 DIAGNOSIS — M5416 Radiculopathy, lumbar region: Secondary | ICD-10-CM | POA: Diagnosis present

## 2024-03-31 DIAGNOSIS — R001 Bradycardia, unspecified: Secondary | ICD-10-CM | POA: Diagnosis not present

## 2024-03-31 DIAGNOSIS — M5116 Intervertebral disc disorders with radiculopathy, lumbar region: Secondary | ICD-10-CM | POA: Diagnosis not present

## 2024-03-31 DIAGNOSIS — Z9884 Bariatric surgery status: Secondary | ICD-10-CM | POA: Diagnosis not present

## 2024-03-31 DIAGNOSIS — M48061 Spinal stenosis, lumbar region without neurogenic claudication: Secondary | ICD-10-CM | POA: Diagnosis not present

## 2024-03-31 DIAGNOSIS — Z01818 Encounter for other preprocedural examination: Secondary | ICD-10-CM

## 2024-03-31 DIAGNOSIS — Z6835 Body mass index (BMI) 35.0-35.9, adult: Secondary | ICD-10-CM | POA: Diagnosis not present

## 2024-03-31 HISTORY — PX: LUMBAR LAMINECTOMY/DECOMPRESSION MICRODISCECTOMY: SHX5026

## 2024-03-31 LAB — GLUCOSE, CAPILLARY: Glucose-Capillary: 92 mg/dL (ref 70–99)

## 2024-03-31 SURGERY — LUMBAR LAMINECTOMY/DECOMPRESSION MICRODISCECTOMY 1 LEVEL
Anesthesia: General | Site: Spine Lumbar | Laterality: Left

## 2024-03-31 MED ORDER — DROPERIDOL 2.5 MG/ML IJ SOLN: 0.6250 mg | Freq: Once | INTRAMUSCULAR | Status: DC | PRN

## 2024-03-31 MED ORDER — HYDROCODONE-ACETAMINOPHEN 10-300 MG PO TABS: 1.0000 | ORAL_TABLET | ORAL | 0 refills | Status: DC | PRN

## 2024-03-31 MED ORDER — 0.9 % SODIUM CHLORIDE (POUR BTL) OPTIME
TOPICAL | Status: DC | PRN
Start: 2024-03-31 — End: 2024-03-31
  Administered 2024-03-31: 500 mL

## 2024-03-31 MED ORDER — DOCUSATE SODIUM 100 MG PO CAPS: 100.0000 mg | ORAL_CAPSULE | Freq: Two times a day (BID) | ORAL | 0 refills | Status: AC

## 2024-03-31 MED ORDER — KETAMINE HCL 50 MG/5ML IJ SOSY
PREFILLED_SYRINGE | INTRAMUSCULAR | Status: DC | PRN
  Administered 2024-03-31: 20 mg via INTRAVENOUS
  Administered 2024-03-31: 30 mg via INTRAVENOUS

## 2024-03-31 MED ORDER — GLYCOPYRROLATE 0.2 MG/ML IJ SOLN
INTRAMUSCULAR | Status: AC
Start: 2024-03-31 — End: ?
  Filled 2024-03-31: qty 1

## 2024-03-31 MED ORDER — KETAMINE HCL 50 MG/5ML IJ SOSY
PREFILLED_SYRINGE | INTRAMUSCULAR | Status: AC
  Filled 2024-03-31: qty 5

## 2024-03-31 MED ORDER — LACTATED RINGERS IV SOLN: INTRAVENOUS | Status: DC

## 2024-03-31 MED ORDER — LIDOCAINE HCL (CARDIAC) PF 100 MG/5ML IV SOSY
PREFILLED_SYRINGE | INTRAVENOUS | Status: DC | PRN
  Administered 2024-03-31: 100 mg via INTRAVENOUS

## 2024-03-31 MED ORDER — CHLORHEXIDINE GLUCONATE 0.12 % MT SOLN
15.0000 mL | Freq: Once | OROMUCOSAL | Status: AC
Start: 2024-03-31 — End: 2024-03-31
  Administered 2024-03-31: 15 mL via OROMUCOSAL

## 2024-03-31 MED ORDER — PHENYLEPHRINE HCL-NACL 20-0.9 MG/250ML-% IV SOLN
INTRAVENOUS | Status: AC
  Filled 2024-03-31: qty 250

## 2024-03-31 MED ORDER — CEFAZOLIN SODIUM-DEXTROSE 2-4 GM/100ML-% IV SOLN
2.0000 g | INTRAVENOUS | Status: AC
  Administered 2024-03-31: 2 g via INTRAVENOUS

## 2024-03-31 MED ORDER — GLYCOPYRROLATE 0.2 MG/ML IJ SOLN
INTRAMUSCULAR | Status: DC | PRN
  Administered 2024-03-31: .2 mg via INTRAVENOUS

## 2024-03-31 MED ORDER — BUPIVACAINE LIPOSOME 1.3 % IJ SUSP
INTRAMUSCULAR | Status: AC
  Filled 2024-03-31: qty 10

## 2024-03-31 MED ORDER — FENTANYL CITRATE (PF) 100 MCG/2ML IJ SOLN
INTRAMUSCULAR | Status: DC | PRN
  Administered 2024-03-31 (×2): 50 ug via INTRAVENOUS

## 2024-03-31 MED ORDER — BUPIVACAINE-EPINEPHRINE (PF) 0.5% -1:200000 IJ SOLN
INTRAMUSCULAR | Status: DC | PRN
Start: 2024-03-31 — End: 2024-03-31
  Administered 2024-03-31: 10 mL

## 2024-03-31 MED ORDER — KETOROLAC TROMETHAMINE 15 MG/ML IJ SOLN
15.0000 mg | Freq: Once | INTRAMUSCULAR | Status: AC
  Administered 2024-03-31: 15 mg via INTRAVENOUS

## 2024-03-31 MED ORDER — ORAL CARE MOUTH RINSE
15.0000 mL | Freq: Once | OROMUCOSAL | Status: AC
Start: 2024-03-31 — End: 2024-03-31

## 2024-03-31 MED ORDER — PROPOFOL 500 MG/50ML IV EMUL
INTRAVENOUS | Status: DC | PRN
  Administered 2024-03-31: 200 mg via INTRAVENOUS
  Administered 2024-03-31: 175 ug/kg/min via INTRAVENOUS

## 2024-03-31 MED ORDER — LIDOCAINE HCL (PF) 2 % IJ SOLN
INTRAMUSCULAR | Status: AC
  Filled 2024-03-31: qty 5

## 2024-03-31 MED ORDER — METHYLPREDNISOLONE ACETATE 40 MG/ML IJ SUSP
INTRAMUSCULAR | Status: AC
Start: 2024-03-31 — End: ?
  Filled 2024-03-31: qty 1

## 2024-03-31 MED ORDER — ACETAMINOPHEN 10 MG/ML IV SOLN
INTRAVENOUS | Status: DC | PRN
  Administered 2024-03-31: 1000 mg via INTRAVENOUS

## 2024-03-31 MED ORDER — FENTANYL CITRATE (PF) 100 MCG/2ML IJ SOLN
INTRAMUSCULAR | Status: AC
Start: 2024-03-31 — End: ?
  Filled 2024-03-31: qty 2

## 2024-03-31 MED ORDER — DOCUSATE SODIUM 100 MG PO CAPS: 100.0000 mg | ORAL_CAPSULE | Freq: Two times a day (BID) | ORAL | 0 refills | Status: DC

## 2024-03-31 MED ORDER — BUPIVACAINE-EPINEPHRINE (PF) 0.5% -1:200000 IJ SOLN
INTRAMUSCULAR | Status: AC
Start: 2024-03-31 — End: ?
  Filled 2024-03-31: qty 10

## 2024-03-31 MED ORDER — PROPOFOL 10 MG/ML IV BOLUS
INTRAVENOUS | Status: AC
  Filled 2024-03-31: qty 20

## 2024-03-31 MED ORDER — CEFAZOLIN SODIUM-DEXTROSE 2-4 GM/100ML-% IV SOLN
INTRAVENOUS | Status: AC
  Filled 2024-03-31: qty 100

## 2024-03-31 MED ORDER — ACETAMINOPHEN 10 MG/ML IV SOLN: 1000.0000 mg | Freq: Once | INTRAVENOUS | Status: DC | PRN

## 2024-03-31 MED ORDER — SUGAMMADEX SODIUM 500 MG/5ML IV SOLN
INTRAVENOUS | Status: DC | PRN
  Administered 2024-03-31: 200 mg via INTRAVENOUS

## 2024-03-31 MED ORDER — SCOPOLAMINE 1 MG/3DAYS TD PT72
1.0000 | MEDICATED_PATCH | Freq: Once | TRANSDERMAL | Status: DC
  Administered 2024-03-31: 1.5 mg via TRANSDERMAL

## 2024-03-31 MED ORDER — METHYLPREDNISOLONE ACETATE 40 MG/ML IJ SUSP
INTRAMUSCULAR | Status: DC | PRN
  Administered 2024-03-31: 40 mg

## 2024-03-31 MED ORDER — DEXAMETHASONE SODIUM PHOSPHATE 10 MG/ML IJ SOLN
INTRAMUSCULAR | Status: AC
Start: 2024-03-31 — End: ?
  Filled 2024-03-31: qty 1

## 2024-03-31 MED ORDER — SODIUM CHLORIDE (PF) 0.9 % IJ SOLN
INTRAMUSCULAR | Status: DC | PRN
  Administered 2024-03-31: 30 mL via INTRAMUSCULAR

## 2024-03-31 MED ORDER — SODIUM CHLORIDE (PF) 0.9 % IJ SOLN
INTRAMUSCULAR | Status: AC
  Filled 2024-03-31: qty 10

## 2024-03-31 MED ORDER — PROPOFOL 1000 MG/100ML IV EMUL
INTRAVENOUS | Status: AC
  Filled 2024-03-31: qty 100

## 2024-03-31 MED ORDER — HYDROCODONE-ACETAMINOPHEN 10-325 MG PO TABS: 1.0000 | ORAL_TABLET | ORAL | 0 refills | Status: AC | PRN

## 2024-03-31 MED ORDER — ROCURONIUM BROMIDE 100 MG/10ML IV SOLN
INTRAVENOUS | Status: DC | PRN
  Administered 2024-03-31: 50 mg via INTRAVENOUS
  Administered 2024-03-31: 10 mg via INTRAVENOUS

## 2024-03-31 MED ORDER — CHLORHEXIDINE GLUCONATE 0.12 % MT SOLN
OROMUCOSAL | Status: AC
  Filled 2024-03-31: qty 15

## 2024-03-31 MED ORDER — MIDAZOLAM HCL 2 MG/2ML IJ SOLN
INTRAMUSCULAR | Status: DC | PRN
  Administered 2024-03-31: 2 mg via INTRAVENOUS

## 2024-03-31 MED ORDER — CEFAZOLIN IN SODIUM CHLORIDE 2-0.9 GM/100ML-% IV SOLN
2.0000 g | Freq: Once | INTRAVENOUS | Status: DC
  Filled 2024-03-31: qty 100

## 2024-03-31 MED ORDER — ACETAMINOPHEN 10 MG/ML IV SOLN
INTRAVENOUS | Status: AC
  Filled 2024-03-31: qty 100

## 2024-03-31 MED ORDER — DEXAMETHASONE SODIUM PHOSPHATE 10 MG/ML IJ SOLN
INTRAMUSCULAR | Status: DC | PRN
  Administered 2024-03-31: 10 mg via INTRAVENOUS

## 2024-03-31 MED ORDER — ONDANSETRON HCL 4 MG/2ML IJ SOLN
INTRAMUSCULAR | Status: DC | PRN
Start: 2024-03-31 — End: 2024-03-31
  Administered 2024-03-31: 4 mg via INTRAVENOUS

## 2024-03-31 MED ORDER — HYDROMORPHONE HCL 1 MG/ML IJ SOLN: 0.2500 mg | INTRAMUSCULAR | Status: DC | PRN

## 2024-03-31 MED ORDER — KETOROLAC TROMETHAMINE 15 MG/ML IJ SOLN
INTRAMUSCULAR | Status: AC
Start: 2024-03-31 — End: ?
  Filled 2024-03-31: qty 1

## 2024-03-31 MED ORDER — SCOPOLAMINE 1 MG/3DAYS TD PT72
MEDICATED_PATCH | TRANSDERMAL | Status: AC
  Filled 2024-03-31: qty 1

## 2024-03-31 MED ORDER — SURGIFLO WITH THROMBIN (HEMOSTATIC MATRIX KIT) OPTIME
TOPICAL | Status: DC | PRN
  Administered 2024-03-31: 1 via TOPICAL

## 2024-03-31 MED ORDER — ONDANSETRON HCL 4 MG/2ML IJ SOLN
INTRAMUSCULAR | Status: AC
Start: 2024-03-31 — End: ?
  Filled 2024-03-31: qty 2

## 2024-03-31 MED ORDER — CEFAZOLIN SODIUM 1 G IJ SOLR
INTRAMUSCULAR | Status: AC
  Filled 2024-03-31: qty 20

## 2024-03-31 MED ORDER — MIDAZOLAM HCL 2 MG/2ML IJ SOLN
INTRAMUSCULAR | Status: AC
  Filled 2024-03-31: qty 2

## 2024-03-31 MED ORDER — BUPIVACAINE HCL (PF) 0.5 % IJ SOLN
INTRAMUSCULAR | Status: AC
Start: 2024-03-31 — End: ?
  Filled 2024-03-31: qty 30

## 2024-03-31 SURGICAL SUPPLY — 32 items
BASIN KIT SINGLE STR (MISCELLANEOUS) ×1 IMPLANT
BRUSH SCRUB EZ 4% CHG (MISCELLANEOUS) ×1 IMPLANT
BUR NEURO DRILL SOFT 3.0X3.8M (BURR) ×1 IMPLANT
DERMABOND ADVANCED .7 DNX12 (GAUZE/BANDAGES/DRESSINGS) ×1 IMPLANT
DRAPE C-ARM XRAY 36X54 (DRAPES) ×2 IMPLANT
DRAPE LAPAROTOMY 100X77 ABD (DRAPES) ×1 IMPLANT
DRAPE MICROSCOPE SPINE 48X150 (DRAPES) ×1 IMPLANT
DRSG OPSITE POSTOP 3X4 (GAUZE/BANDAGES/DRESSINGS) ×1 IMPLANT
DRSG TEGADERM 4X4.75 (GAUZE/BANDAGES/DRESSINGS) ×1 IMPLANT
ELECTRODE EZSTD 165MM 6.5IN (MISCELLANEOUS) ×1 IMPLANT
ELECTRODE REM PT RTRN 9FT ADLT (ELECTROSURGICAL) ×1 IMPLANT
GLOVE BIOGEL PI IND STRL 7.0 (GLOVE) ×1 IMPLANT
GLOVE BIOGEL PI IND STRL 8 (GLOVE) ×2 IMPLANT
GLOVE SURG SYN 7.0 (GLOVE) IMPLANT
GLOVE SURG SYN 7.0 PF PI (GLOVE) ×1 IMPLANT
GLOVE SURG SYN 7.5 E (GLOVE) IMPLANT
GLOVE SURG SYN 7.5 PF PI (GLOVE) ×1 IMPLANT
GOWN SRG XL LVL 3 NONREINFORCE (GOWNS) ×1 IMPLANT
GOWN STRL REUS W/ TWL LRG LVL3 (GOWN DISPOSABLE) ×1 IMPLANT
KIT WILSON FRAME (KITS) ×1 IMPLANT
KNIFE BAYONET SHORT DISCETOMY (MISCELLANEOUS) IMPLANT
NDL SAFETY ECLIPSE 18X1.5 (NEEDLE) ×1 IMPLANT
NS IRRIG 500ML POUR BTL (IV SOLUTION) ×1 IMPLANT
PACK LAMINECTOMY ARMC (PACKS) ×1 IMPLANT
PAD ARMBOARD POSITIONER FOAM (MISCELLANEOUS) ×2 IMPLANT
SURGIFLO W/THROMBIN 8M KIT (HEMOSTASIS) ×1 IMPLANT
SUT STRATA 3-0 15 PS-2 (SUTURE) ×1 IMPLANT
SUT VIC AB 0 CT1 27XCR 8 STRN (SUTURE) ×1 IMPLANT
SUT VIC AB 2-0 CT1 18 (SUTURE) ×1 IMPLANT
SYR 30ML LL (SYRINGE) ×2 IMPLANT
SYR 3ML LL SCALE MARK (SYRINGE) ×1 IMPLANT
TRAP FLUID SMOKE EVACUATOR (MISCELLANEOUS) ×1 IMPLANT

## 2024-03-31 NOTE — Transfer of Care (Signed)
 Immediate Anesthesia Transfer of Care Note  Patient: Haley Garza  Procedure(s) Performed: LUMBAR LAMINECTOMY/DECOMPRESSION MICRODISCECTOMY 1 LEVEL (Left: Spine Lumbar)  Patient Location: PACU  Anesthesia Type:General  Level of Consciousness: drowsy and patient cooperative  Airway & Oxygen Therapy: Patient Spontanous Breathing and Patient connected to face mask oxygen  Post-op Assessment: Report given to RN, Post -op Vital signs reviewed and stable, and Patient moving all extremities X 4  Post vital signs: Reviewed and stable  Last Vitals:  Vitals Value Taken Time  BP 108/78 03/31/24 0851  Temp 36.1 C 03/31/24 0851  Pulse 61 03/31/24 0857  Resp 14 03/31/24 0857  SpO2 100 % 03/31/24 0857  Vitals shown include unfiled device data.  Last Pain:  Vitals:   03/31/24 0851  TempSrc:   PainSc: Asleep         Complications: No notable events documented.

## 2024-03-31 NOTE — Anesthesia Procedure Notes (Signed)
 Procedure Name: Intubation Date/Time: 03/31/2024 7:44 AM  Performed by: Ian Maine, RNPre-anesthesia Checklist: Patient identified, Emergency Drugs available, Suction available and Patient being monitored Patient Re-evaluated:Patient Re-evaluated prior to induction Oxygen Delivery Method: Circle system utilized Preoxygenation: Pre-oxygenation with 100% oxygen Induction Type: IV induction Ventilation: Mask ventilation without difficulty Laryngoscope Size: McGrath and 4 Grade View: Grade I Tube type: Oral Tube size: 7.0 mm Number of attempts: 1 Airway Equipment and Method: Stylet and Oral airway Placement Confirmation: ETT inserted through vocal cords under direct vision, positive ETCO2 and breath sounds checked- equal and bilateral Secured at: 22 cm Tube secured with: Tape Dental Injury: Teeth and Oropharynx as per pre-operative assessment

## 2024-03-31 NOTE — Telephone Encounter (Signed)
 Resent to pharmacy in [place]

## 2024-03-31 NOTE — Op Note (Signed)
 Indications: [name] is suffering from lumbar radiculopathy. The patient tried and failed conservative management, prompting surgical intervention.  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   Preoperative Note:   Risks of surgery discussed include: infection, bleeding, stroke, coma, death, paralysis, CSF leak, nerve/spinal cord injury, numbness, tingling, weakness, complex regional pain syndrome, recurrent stenosis and/or disc herniation, vascular injury, development of instability, neck/back pain, need for further surgery, persistent symptoms, development of deformity, and the risks of anesthesia. The patient understood these risks and agreed to proceed.  Operative Note:   1) Left L4/5 microdiscectomy  The patient was then brought from the preoperative center with intravenous access established.  The patient underwent general anesthesia and endotracheal tube intubation, and was then rotated on the [place] rail top where all pressure points were appropriately padded.  The skin was then thoroughly cleansed.  Perioperative antibiotic prophylaxis was administered.  Sterile prep and drapes were then applied and a timeout was then observed.  C-arm was brought into the field under sterile conditions, and the L4-5 disc space identified and marked with an incision on the left 1cm lateral to midline.  Once this was complete a 2 cm incision was opened with the use of a #10 blade knife.  The Metrx tubes were sequentially advanced  under lateral fluoroscopy until a 18 x 80 mm Metrx tube was placed over the facet and lamina and secured to the bed.    The microscope was then sterilely brought into the field and muscle creep was hemostased with a bipolar and resected with a pituitary rongeur.  A Bovie extender was then used to expose the spinous process and lamina.  Careful attention was placed to not violate the facet capsule. A 3 mm matchstick drill bit was then used to make a hemi-laminotomy trough until the ligamentum flavum was exposed.  This was extended to the base of the spinous process.  Once this was complete and the underlying ligamentum flavum was visualized, the ligamentum was dissected with an up angle curette and resected with a #2 and #3 mm biting Kerrison.  The laminotomy opening was also expanded in similar fashion and hemostasis was obtained with Surgifoam and a patty.  The rostral aspect of the caudal level of the lamina was also resected with a #2 biting Kerrison effort to further enhance exposure.  Once the underlying dura was visualized a Penfield 4 was then used to dissect and expose the traversing nerve root.  Once this was identified a nerve root retractor suction was used to mobilize this medially.  The venous plexus was hemostased with Surgifoam and light bipolar use.  A small penfied was then used to make a small annulotomy within the disc space and disc space contents were noted to come through the annulus.    The disc herniation was identified and dissected free using a balltip probe. The pituitary rongeur was used to remove the extruded disc fragments. Once the thecal sac and nerve root were noted to be relaxed and under less tension the ball-tipped feeler was passed along the foramen distally to ensure no  residual compression was noted.    Depo-Medrol  was placed along the nerve root.  The area was irrigated. The tube system was then removed under microscopic visualization and hemostasis was obtained with a  bipolar.    The fascial layer was reapproximated with the use of a 0- Vicryl suture.  Subcutaneous tissue layer was reapproximated using 2-0 Vicryl suture.  3-0 monocryl was used on the skin. The skin was then cleansed and Dermabond was used to close the skin opening.  Patient was then rotated back to the preoperative bed awakened from anesthesia and taken to recovery all counts are correct in this case.   I performed the entire procedure without an assistant surgeon   Carroll Clamp, MD [facility] Neurosurgery

## 2024-03-31 NOTE — Anesthesia Preprocedure Evaluation (Addendum)
 Anesthesia Evaluation  Patient identified by MRN, date of birth, ID band Patient awake    Reviewed: Allergy & Precautions, NPO status , Patient's Chart, lab work & pertinent test results  History of Anesthesia Complications (+) PONV and history of anesthetic complications  Airway Mallampati: II  TM Distance: >3 FB Neck ROM: Full    Dental no notable dental hx. (+) Teeth Intact   Pulmonary asthma , neg sleep apnea, neg COPD, Patient abstained from smoking.Not current smoker, former smoker   Pulmonary exam normal breath sounds clear to auscultation       Cardiovascular Exercise Tolerance: Good METS(-) hypertension(-) CAD and (-) Past MI + dysrhythmias (bradycardia)  Rhythm:Regular Rate:Bradycardia + Systolic murmurs    Neuro/Psych  PSYCHIATRIC DISORDERS Anxiety Depression    negative neurological ROS     GI/Hepatic ,neg GERD  ,,(+)     (-) substance abuse    Endo/Other  neg diabetes    Renal/GU negative Renal ROS     Musculoskeletal   Abdominal  (+) + obese  Peds  Hematology   Anesthesia Other Findings Past Medical History: No date: Anemia No date: Anxiety No date: Arthritis No date: Depression No date: History of kidney stones No date: IFG (impaired fasting glucose) No date: Obesity No date: PONV (postoperative nausea and vomiting) No date: Tobacco abuse  Reproductive/Obstetrics                             Anesthesia Physical Anesthesia Plan  ASA: 2  Anesthesia Plan: General   Post-op Pain Management: Minimal or no pain anticipated   Induction: Intravenous  PONV Risk Score and Plan: 4 or greater and Propofol  infusion, TIVA, Ondansetron  and Dexamethasone   Airway Management Planned: Oral ETT  Additional Equipment: None  Intra-op Plan:   Post-operative Plan: Extubation in OR  Informed Consent: I have reviewed the patients History and Physical, chart, labs and discussed  the procedure including the risks, benefits and alternatives for the proposed anesthesia with the patient or authorized representative who has indicated his/her understanding and acceptance.     Dental advisory given  Plan Discussed with: CRNA and Surgeon  Anesthesia Plan Comments:        Anesthesia Quick Evaluation

## 2024-03-31 NOTE — Telephone Encounter (Signed)
 Left L4-5 Microdiscectomy on 03/31/2024  Haley Garza pt's mom is calling for the patient. The pharmacy called her that her rx required authorization. Can you take care of that? But also her pharmacy is [facility] in [place] on [address]. Her prescription was sent to the wrong pharmacy. They would like a call back when this as been corrected please.

## 2024-03-31 NOTE — Interval H&P Note (Signed)
 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  The various methods of treatment have been discussed with the patient and family. After consideration of risks, benefits and other options for treatment, the patient has consented to  Procedure(s) with comments: LUMBAR LAMINECTOMY/DECOMPRESSION MICRODISCECTOMY 1 LEVEL (Left) - MIS LEFT L4-5 MICRODISCECTOMY as a surgical intervention.  The patient's history has been reviewed, patient examined, no change in status, stable for surgery.  I have reviewed the patient's chart and labs.  Questions were answered to the patient's satisfaction.    Heart and lugns clear    Carroll Clamp

## 2024-03-31 NOTE — Anesthesia Postprocedure Evaluation (Signed)
 Anesthesia Post Note  Patient: Occupational psychologist  Procedure(s) Performed: LUMBAR LAMINECTOMY/DECOMPRESSION MICRODISCECTOMY 1 LEVEL (Left: Spine Lumbar)  Patient location during evaluation: PACU Anesthesia Type: General Level of consciousness: awake and alert Pain management: pain level controlled Vital Signs Assessment: post-procedure vital signs reviewed and stable Respiratory status: spontaneous breathing, nonlabored ventilation and respiratory function stable Cardiovascular status: blood pressure returned to baseline and stable Postop Assessment: no apparent nausea or vomiting Anesthetic complications: no   No notable events documented.   Last Vitals:  Vitals:   03/31/24 0928 03/31/24 0947  BP: 106/80 115/89  Pulse: (!) 52 80  Resp: 11   Temp: (!) 36.3 C (!) 36.2 C  SpO2: 100% 100%    Last Pain:  Vitals:   03/31/24 0947  TempSrc: Temporal  PainSc: 0-No pain                 Baltazar Bonier

## 2024-03-31 NOTE — Telephone Encounter (Signed)
 I called [facility] to cancel.

## 2024-03-31 NOTE — Telephone Encounter (Addendum)
 Please let them know that the rx was resent to [facility] and I called the pharmacy and it went through without authorization. It doesn't require authorization now that it was switched from hydrocodone  10-300 to 10-325.

## 2024-03-31 NOTE — Telephone Encounter (Signed)
 The authorization is asking "Does the patient have a history of failure, contraindication, or intolerance to hydrocodone -acetaminophen  10/325mg "   Did you mean to send 10-325 instead of 10-300? Also, it needs to go to [facility] in [place]?

## 2024-03-31 NOTE — Discharge Instructions (Addendum)
 Your surgeon has performed an operation on your lumbar spine (low back) to relieve pressure on one or more nerves. Many times, patients feel better immediately after surgery and can "overdo it." Even if you feel well, it is important that you follow these activity guidelines. If you do not let your back heal properly from the surgery, you can increase the chance of a disc herniation and/or return of your symptoms. The following are instructions to help in your recovery once you have been discharged from the hospital.  * It is ok to take NSAIDs after surgery.  You were given a small prescription of pain medication, while you are taking this you should also take a stool softener.  A stool softener was prescribed for you, commonly these are not covered by insurance, should this be the case you can often obtain this over-the-counter for a lower price.  Activity    No bending, lifting, or twisting ("BLT"). Avoid lifting objects heavier than 10 pounds (gallon milk jug).  Where possible, avoid household activities that involve lifting, bending, pushing, or pulling such as laundry, vacuuming, grocery shopping, and childcare. Try to arrange for help from friends and family for these activities while your back heals.  Increase physical activity slowly as tolerated.  Taking short walks is encouraged, but avoid strenuous exercise. Do not jog, run, bicycle, lift weights, or participate in any other exercises unless specifically allowed by your doctor. Avoid prolonged sitting, including car rides.  Talk to your doctor before resuming sexual activity.  You should not drive until cleared by your doctor.  Until released by your doctor, you should not return to work or school.  You should rest at home and let your body heal.   You may shower three days after your surgery.  After showering, lightly dab your incision dry. Do not take a tub bath or go swimming for 3 weeks, or until approved by your doctor at your  follow-up appointment.  If you smoke, we strongly recommend that you quit.  Smoking has been proven to interfere with normal healing in your back and will dramatically reduce the success rate of your surgery. Please contact [facility] (800-QUIT-NOW) and use the resources at www.[facility].com for assistance in stopping smoking.  Surgical Incision   If you have a dressing on your incision, you may remove it three days after your surgery. Keep your incision area clean and dry.  If you have staples or stitches on your incision, you should have a follow up scheduled for removal. If you do not have staples or stitches, you will have steri-strips (small pieces of surgical tape) or Dermabond glue. The steri-strips/glue should begin to peel away within about a week (it is fine if the steri-strips fall off before then). If the strips are still in place one week after your surgery, you may gently remove them.  Diet            You may return to your usual diet. Be sure to stay hydrated.  When to Contact us  Although your surgery and recovery will likely be uneventful, you may have some residual numbness, aches, and pains in your back and/or legs. This is normal and should improve in the next few weeks.  However, should you experience any of the following, contact us immediately: New numbness or weakness Pain that is progressively getting worse, and is not relieved by your pain medications or rest Bleeding, redness, swelling, pain, or drainage from surgical incision Chills or flu-like symptoms  Fever greater than 101.0 F (38.3 C) Problems with bowel or bladder functions Difficulty breathing or shortness of breath Warmth, tenderness, or swelling in your calf  Contact Information How to contact us:  If you have any questions/concerns before or after surgery, you can reach [place] at [phone], or you can send a mychart message. We can be reached by phone or mychart 8am-4pm, Monday-Friday.  *Please note:  Calls after 4pm are forwarded to a third party answering service. Mychart messages are not routinely monitored during evenings, weekends, and holidays. Please call our office to contact the answering service for urgent concerns during non-business hours.

## 2024-04-01 ENCOUNTER — Encounter: Payer: Self-pay | Admitting: Neurosurgery

## 2024-04-06 ENCOUNTER — Encounter: Admitting: Family Medicine

## 2024-04-06 ENCOUNTER — Telehealth: Payer: Self-pay | Admitting: Neurosurgery

## 2024-04-06 NOTE — Telephone Encounter (Signed)
  Media Information   Document Information  Post Op Left L4-5 Microdiscectomy on 03/31/2024

## 2024-04-06 NOTE — Telephone Encounter (Signed)
 Patient was seen in ER over the weekend. This is a non-surgical concern that should be followed up by PCP.

## 2024-04-07 ENCOUNTER — Telehealth: Payer: Self-pay | Admitting: Neurosurgery

## 2024-04-07 NOTE — Telephone Encounter (Signed)
 I spoke with the patient.   She denies chest pain. She is experiencing nausea for the past 3-4 days right before she realized she had thrush. She believes the thrush was a result of an allergic reaction from lip balm that had beeswax in it.  She is taking:  Clotrimazole (this is a new rx for thrush; started this on Saturday)  Tizanidine  4mg  three times per day (she was taking this before surgery without issue)  Gabapentin  100 mg three times per day (she was taking this before surgery without issue)  Norco 10-325: 1/2 tablet every 6-7 hours (this is a new medication since surgery). She was not taking this with food at first. Once she noticed she was experiencing nausea, she made sure to take it with at least some crackers. We discussed that she should try to take this with a meal.  Ibuprofen : 600mg  tablet twice per day  She feels like her pain control is OK.  She is agreeable to trying some nausea medicine or a pain medication that isn't as strong.  [facility] [place]

## 2024-04-07 NOTE — Telephone Encounter (Signed)
 Per verbal conversation with Dr. Felipe Horton, he will send in Tramadol  and Zofran  for the patient.

## 2024-04-07 NOTE — Telephone Encounter (Signed)
 Left L4-5 Microdiscectomy on 03/31/2024   Patient is calling to let our office know that she has been having a lot of nausea and heartburn for the last 3-4 days. She does not know if this is from the combination of medications or what. Please advise.

## 2024-04-08 ENCOUNTER — Other Ambulatory Visit: Payer: Self-pay | Admitting: Physician Assistant

## 2024-04-08 MED ORDER — ONDANSETRON HCL 4 MG PO TABS: 4.0000 mg | ORAL_TABLET | Freq: Three times a day (TID) | ORAL | 0 refills | Status: AC | PRN

## 2024-04-08 MED ORDER — TRAMADOL HCL 50 MG PO TABS: 50.0000 mg | ORAL_TABLET | Freq: Four times a day (QID) | ORAL | 0 refills | Status: DC | PRN

## 2024-04-08 MED ORDER — ONDANSETRON HCL 4 MG PO TABS: 4.0000 mg | ORAL_TABLET | Freq: Three times a day (TID) | ORAL | 0 refills | Status: DC | PRN

## 2024-04-08 NOTE — Telephone Encounter (Signed)
 I notified the patient of the new medications

## 2024-04-15 ENCOUNTER — Ambulatory Visit (INDEPENDENT_AMBULATORY_CARE_PROVIDER_SITE_OTHER): Admitting: Physician Assistant

## 2024-04-15 ENCOUNTER — Encounter: Payer: Self-pay | Admitting: Physician Assistant

## 2024-04-15 VITALS — BP 130/86 | Temp 98.2°F

## 2024-04-15 DIAGNOSIS — M5126 Other intervertebral disc displacement, lumbar region: Secondary | ICD-10-CM

## 2024-04-15 DIAGNOSIS — Z9889 Other specified postprocedural states: Secondary | ICD-10-CM

## 2024-04-15 MED ORDER — TRAMADOL HCL 50 MG PO TABS: 50.0000 mg | ORAL_TABLET | Freq: Four times a day (QID) | ORAL | 0 refills | Status: DC | PRN

## 2024-04-15 NOTE — Progress Notes (Signed)
   REFERRING PHYSICIAN:  Davis Esters [address] [place],  [place] [address]  DOS: MIS left L4-5 microdiscectomy, 03/31/2024  HISTORY OF PRESENT ILLNESS: Haley Garza is approximately 2 weeks status post left L4-5 microdiscectomy. she is doing well.  Her pain is very much improved compared to prior to surgery.  She is no longer having shooting pain down her left leg.  She is having some bilateral outer thigh numbness and tingling which is new.  She has been taking tizanidine  and tramadol  primarily for her pain in addition to the gabapentin .  No new weakness, numbness or tingling.  No saddle anesthesia.  No incontinence of bowel or bladder.  PHYSICAL EXAMINATION:  General: Patient is well developed, well nourished, calm, collected, and in no apparent distress.   NEUROLOGICAL:  General: In no acute distress.   Awake, alert, oriented to person, place, and time.  Pupils equal round and reactive to light.  Facial tone is symmetric.     Strength:            Side Iliopsoas Quads Hamstring PF DF EHL  R 5 5 5 5 5 5   L 5 5 5 5 5 5    Incision c/d/i   ROS (Neurologic):  Negative except as noted above  IMAGING: No new imaging prior to his appointment.  ASSESSMENT/PLAN:  Haley Garza is doing well approximately 2 weeks after MIS left L4-5 microdiscectomy. she will follow up in 1 month for 6-week postop visit.I have advised the patient to lift up to 10 pounds until 6 weeks after surgery, then increase up to 25 pounds until 12 weeks after surgery.  After 12 weeks post-op, the patient advised to increase activity as tolerated.  Activity restriction reviewed at length.  Tramadol  refill sent at this time.  Advised to contact the office if any questions or concerns arise.  Ludwig Safer PA-C Department of neurosurgery

## 2024-04-27 ENCOUNTER — Other Ambulatory Visit: Payer: Self-pay | Admitting: Physician Assistant

## 2024-04-27 ENCOUNTER — Telehealth: Payer: Self-pay | Admitting: Physician Assistant

## 2024-04-27 MED ORDER — TRAMADOL HCL 50 MG PO TABS: 50.0000 mg | ORAL_TABLET | Freq: Four times a day (QID) | ORAL | 0 refills | Status: DC | PRN

## 2024-04-27 NOTE — Telephone Encounter (Signed)
 Prescription Request  04/27/2024  LOV: 04/15/2024  What is the name of the medication or equipment? traMADol  (ULTRAM ) 50 MG tablet   Have you contacted your pharmacy to request a refill? No   Which pharmacy would you like this sent to?  [facility] [address], [place] - [address] [place] [address] Phone: [phone] Fax: [phone]  [facility] [address] [place] - [address] [place] [address] Phone: [phone] Fax: [phone]  [facility] [address] [place]), [place] - [address] [place] [address] Phone: [phone] Fax: [phone]    Patient notified that their request is being sent to the clinical staff for review and that they should receive a response within 2 business days.   Please advise at [name] [phone]   Patient would prefer [name] location due to her staying with her mother at the time.

## 2024-04-27 NOTE — Telephone Encounter (Signed)
 Call unable to be completed- sent mychart message notifying patient

## 2024-05-06 ENCOUNTER — Telehealth: Payer: Self-pay | Admitting: Neurosurgery

## 2024-05-06 ENCOUNTER — Other Ambulatory Visit: Payer: Self-pay | Admitting: Physician Assistant

## 2024-05-06 MED ORDER — TIZANIDINE HCL 4 MG PO TABS: 4.0000 mg | ORAL_TABLET | Freq: Three times a day (TID) | ORAL | 0 refills | Status: DC | PRN

## 2024-05-06 MED ORDER — GABAPENTIN 100 MG PO CAPS: ORAL_CAPSULE | ORAL | 3 refills | Status: DC

## 2024-05-06 MED ORDER — TRAMADOL HCL 50 MG PO TABS: 50.0000 mg | ORAL_TABLET | Freq: Four times a day (QID) | ORAL | 0 refills | Status: DC | PRN

## 2024-05-06 NOTE — Telephone Encounter (Signed)
 Patient notified

## 2024-05-06 NOTE — Telephone Encounter (Signed)
 Prescription Request  05/06/2024  LOV: 03/16/2024  What is the name of the medication or equipment? gabapentin  (NEURONTIN ) 100 MG capsule   tiZANidine  (ZANAFLEX ) 4 MG tablet   traMADol  (ULTRAM ) 50 MG tablet  Have you contacted your pharmacy to request a refill? No   Which pharmacy would you like this sent to?  [facility] [address] [place] - [address] [place] [address] Phone: [phone] Fax: [phone]  [facility] [address] [place] - [address] [place] [address] Phone: [phone] Fax: [phone]  [facility] [address] - [address] [place]), [place] - [address] [place] [address] Phone: [phone] Fax: [phone]    Patient notified that their request is being sent to the clinical staff for review and that they should receive a response within 2 business days.   Please advise at [facility] [phone]   Patient needing all 3 medications refilled and sent to [facility] in [place]

## 2024-05-11 ENCOUNTER — Ambulatory Visit (INDEPENDENT_AMBULATORY_CARE_PROVIDER_SITE_OTHER): Admitting: Neurosurgery

## 2024-05-11 ENCOUNTER — Encounter: Payer: Self-pay | Admitting: Neurosurgery

## 2024-05-11 VITALS — BP 124/78 | Temp 98.1°F | Ht 67.0 in | Wt 228.0 lb

## 2024-05-11 DIAGNOSIS — M5126 Other intervertebral disc displacement, lumbar region: Secondary | ICD-10-CM

## 2024-05-11 DIAGNOSIS — M5416 Radiculopathy, lumbar region: Secondary | ICD-10-CM

## 2024-05-11 DIAGNOSIS — Z09 Encounter for follow-up examination after completed treatment for conditions other than malignant neoplasm: Secondary | ICD-10-CM

## 2024-05-11 DIAGNOSIS — Z9889 Other specified postprocedural states: Secondary | ICD-10-CM

## 2024-05-11 NOTE — Progress Notes (Signed)
   REFERRING PHYSICIAN:  Vicci Duwaine SHAUNNA Rosalea [address] [place],  [place] [address]  DOS: MIS left L4-5 microdiscectomy, 03/31/2024  HISTORY OF PRESENT ILLNESS: Ashlie Mcmenamy is approximately 6 weeks status post left L4-5 microdiscectomy.  Overall she is doing very well.  She states that her lower extremity pain is much better than it was preoperatively.  She was previously having some lateral thigh numbness which was likely positional but that has gotten better.  She continues to have some soreness in her back but overall is doing very well.   PHYSICAL EXAMINATION:  General: Patient is well developed, well nourished, calm, collected, and in no apparent distress.   NEUROLOGICAL:  General: In no acute distress.   Awake, alert, oriented to person, place, and time.  Pupils equal round and reactive to light.  Facial tone is symmetric.     Strength:            Side Iliopsoas Quads Hamstring PF DF EHL  R 5 5 5 5 5 5   L 5 5 5 5 5 5    Incision c/d/i   ROS (Neurologic):  Negative except as noted above  IMAGING: No new imaging prior to his appointment.  ASSESSMENT/PLAN:  Jessieca Rhem is doing well approximately 2 weeks after MIS left L4-5 microdiscectomy.  At this point she is showing expected recovery.  She has had a very good improvement in her lower extremity pain.  She does have some soreness in her back as expected.  At this point I like her to start with physical therapy.  Order has been placed.  Like her to follow-up in 6 weeks for her 12-week postop.  MICAEL Sharps, MD Department of neurosurgery

## 2024-05-11 NOTE — Patient Instructions (Signed)
 LOCAL PHYSICAL THERAPY  [facility]  [address]  [place], [place] [address]  [phone]   [facility]  [address] [place], [place] [address]  [phone]   [facility] (2 locations)  [address]  [place], [place] [address]  [phone]          or  [address]  [place], [place] [address]  [phone]   [facility]  [address]  [place], [place] [address]  [phone]  **dry needling**   [facility]  [address]  [place], [place] [address]  [phone]  Fax: [phone]  ** Aquatic therapy[address] [place], [place] [address] [phone] **Aquatic therapy**  [facility] [address] [place], [place] [address] [phone]  [facility]  [address]  [place], [place] [address]  [phone]   [facility]  [address]  [place], [place] [address]  [phone]   [facility]  [address]   [place] NOVAK  [place], [place] [address]  [phone]   [facility]  [address]  [place], [place] [address]  [phone]  **dry needling**   PELVIC FLOOR/SI JOINT  [facility]  Nidia Laud, PT  [email or web address]   [place]   [facility]  [address]  Suite [place], [place] [address]  [phone]   [facility] [address] [place], [place] [address] [phone]  [facility] Physical Therapy [address] 2nd floor [place], [place] [address] [phone]  [facility] Physical Therapy [address] [place]. [place], [place] [address] [phone]  [facility] [address] [place], [place]  [address] [phone]  [facility]  [address]  [place], [place] [address]  [phone]  [facility] [address] [place], [place] [address] [phone]  [facility]  [address] [place], [place] [address] [phone]   [facility] Physical Therapy & Aquatic Therapy [address] [place], [place] [address] [phone]  [facility] [place]  [address] [place] [address]  [phone]   [facility] [address] [place], [place] [address]  [facility] [phone] [place], [place] [address] [phone]  [facility] [address] [place], [place] [address] [phone]  [facility] [address] [place], [place] [address]  [facility] [address] [place], [place] [address] [phone]  [facility] Therapy [address], [place] [address] [phone]  [facility] [address]  [place], [place] [phone]  [facility] [address] [place], [place] [address] [phone]  [facility] Physical Therapy & wellness [address] [place], [place] [address] [phone]  [facility] [address], [place] [address] [phone]  [facility], [place]   [facility]  Channing Dross, PT  [address]  [place], [place] [address] [phone]   CARIDAD LIEN   [facility]  [address]  [phone]   [facility]   Deep [address] Physical Therapy  [address]  [phone]   CHIROPRACTORS   [facility]  [facility]  [address]  [place], [place] [address]  [phone]  **No Aetna or medicaid**   [facility]  [phone] [address], [place] [address]  [phone]   [facility]  [address] [place], [place] [address]  [phone]   Todd Asa, [place]  [address], [place] [address]  [phone]   [facility]  [address] [place] [address]  [phone]   [facility]  [address]  [place], [place] [address]  [phone]  [facility]  (3 locations)  [address]  [place], [place] [address]  [phone]  **dry needling**           or  [address] [place], [place] [address]  [phone]  **Additionally has Deland Tedra Ip, OT**           or  [address]  [place], [place] [address]  [phone]  **Pediatric therapy**  [facility]  [address]  [phone]  **dry needlingTHORA Geradine [facility]  [address]  [place], [place] [address]  [phone]  [facility]   (Inside [address] Fitness)  [address]  [place], [place] [address]  [phone]  **dry needling**  **MEDICAID or UNINSURED** [facility] dept. Of Physical Therapy [place], [place] [address] [phone]  [facility]  [address] [place], [place] [address]  [phone]   [facility] Physical Therapy  [address]  [place], [place] [address]  [phone]   [facility]  [address] [place], [place] [address]  [phone]   [facility] Rehabilitation  [address]  [place], [place] [address]  [phone]   [facility] Physical Therapy  [address] [place], [place] [address]  [phone]   [facility] Physical Therapy  [address]  [place], [place] [address]  [phone]   [facility] [address] [place], [place] [address]  AQUATIC   [employer] [facility]  [facility]  [place]   [address]  *Residents only*  [employer] at [employer]  *Residents  only*   [facility]  Exercise class  [facility]  Exercise class    [place], [place]  [facility]  [address]  [phone]   [facility]  [address]  [phone]           or  [address]  [phone]   [facility] Arthritis Support Group   Provides education and support and practical information for coping with arthritis for arthritis sufferers and their families.   When: 12:15 - 1:30 p.m. the second Monday of each month, March through December  Info: Call Rehabilitation Services at [phone]

## 2024-05-15 ENCOUNTER — Telehealth: Payer: Self-pay | Admitting: Neurosurgery

## 2024-05-15 ENCOUNTER — Other Ambulatory Visit: Payer: Self-pay | Admitting: Physician Assistant

## 2024-05-15 MED ORDER — TRAMADOL HCL 50 MG PO TABS: 50.0000 mg | ORAL_TABLET | Freq: Four times a day (QID) | ORAL | 0 refills | Status: DC | PRN

## 2024-05-15 NOTE — Telephone Encounter (Signed)
 Patient is calling to request a refill of Tramadol  be sent to [facility] in [place].

## 2024-05-18 ENCOUNTER — Encounter: Admitting: Family Medicine

## 2024-05-18 NOTE — Telephone Encounter (Signed)
 Left patient a detailed voicemail regarding the medication.

## 2024-06-09 ENCOUNTER — Telehealth: Payer: Self-pay

## 2024-06-09 MED ORDER — TRAMADOL HCL 50 MG PO TABS: 50.0000 mg | ORAL_TABLET | Freq: Two times a day (BID) | ORAL | 0 refills | Status: AC | PRN

## 2024-06-09 MED ORDER — TIZANIDINE HCL 4 MG PO TABS: 4.0000 mg | ORAL_TABLET | Freq: Three times a day (TID) | ORAL | 0 refills | Status: DC | PRN

## 2024-06-09 NOTE — Telephone Encounter (Signed)
 Patient calling for med refills Tizanidine  and Tramadol   [facility]

## 2024-06-09 NOTE — Telephone Encounter (Signed)
 I have left a detailed message on her secure voicemail about the refills and about the change on the tramadol . I notified her to call us  back if she had any questions.

## 2024-06-22 ENCOUNTER — Encounter: Admitting: Physician Assistant

## 2024-07-01 ENCOUNTER — Ambulatory Visit (INDEPENDENT_AMBULATORY_CARE_PROVIDER_SITE_OTHER): Admitting: Physician Assistant

## 2024-07-01 VITALS — BP 122/78 | Ht 67.0 in | Wt 228.0 lb

## 2024-07-01 DIAGNOSIS — Z09 Encounter for follow-up examination after completed treatment for conditions other than malignant neoplasm: Secondary | ICD-10-CM

## 2024-07-01 DIAGNOSIS — M5416 Radiculopathy, lumbar region: Secondary | ICD-10-CM

## 2024-07-01 DIAGNOSIS — M5126 Other intervertebral disc displacement, lumbar region: Secondary | ICD-10-CM

## 2024-07-01 MED ORDER — TIZANIDINE HCL 4 MG PO TABS: 4.0000 mg | ORAL_TABLET | Freq: Three times a day (TID) | ORAL | 0 refills | Status: AC | PRN

## 2024-07-01 MED ORDER — TRAMADOL HCL 50 MG PO TABS: 50.0000 mg | ORAL_TABLET | Freq: Four times a day (QID) | ORAL | 0 refills | Status: AC | PRN

## 2024-07-01 MED ORDER — GABAPENTIN 100 MG PO CAPS: ORAL_CAPSULE | ORAL | 3 refills | Status: AC

## 2024-07-01 NOTE — Progress Notes (Unsigned)
   REFERRING PHYSICIAN:  Vicci Duwaine SHAUNNA Rosalea [address] [place],  [place] [address]  DOS: MIS left L4-5 microdiscectomy, 03/31/2024  HISTORY OF PRESENT ILLNESS: Haley Garza is approximately 6 weeks status post left L4-5 microdiscectomy.  Overall she is doing very well.  She states that her lower extremity pain is much better than it was preoperatively.  She was previously having some lateral thigh numbness which was likely positional but that has gotten better.  She continues to have some soreness in her back but overall is doing very well.   PHYSICAL EXAMINATION:  General: Patient is well developed, well nourished, calm, collected, and in no apparent distress.   NEUROLOGICAL:  General: In no acute distress.   Awake, alert, oriented to person, place, and time.  Pupils equal round and reactive to light.  Facial tone is symmetric.     Strength:            Side Iliopsoas Quads Hamstring PF DF EHL  R 5 5 5 5 5 5   L 5 5 5 5 5 5    Incision c/d/i   ROS (Neurologic):  Negative except as noted above  IMAGING: No new imaging prior to his appointment.  ASSESSMENT/PLAN:  Haley Garza is doing well approximately 2 weeks after MIS left L4-5 microdiscectomy.  At this point she is showing expected recovery.  She has had a very good improvement in her lower extremity pain.  She does have some soreness in her back as expected.  At this point I like her to start with physical therapy.  Order has been placed.  Like her to follow-up in 6 weeks for her 12-week postop.  MICAEL Sharps, MD Department of neurosurgery

## 2024-07-01 NOTE — Patient Instructions (Signed)
 An FCE must be ordered by a physician. They can be coordinated and scheduled by your Physicians Office, Case Manager, Insurance underwriter, Aeronautical engineer. To schedule an FCE please call [phone]

## 2024-07-16 ENCOUNTER — Ambulatory Visit
Admission: EM | Admit: 2024-07-16 | Discharge: 2024-07-16 | Disposition: A | Discharge status: Home / Self Care | Attending: Nurse Practitioner | Admitting: Nurse Practitioner

## 2024-07-16 DIAGNOSIS — U071 COVID-19: Secondary | ICD-10-CM | POA: Diagnosis not present

## 2024-07-16 LAB — POC SOFIA SARS ANTIGEN FIA: SARS Coronavirus 2 Ag: POSITIVE — AB

## 2024-07-16 MED ORDER — FLUTICASONE PROPIONATE 50 MCG/ACT NA SUSP: 2.0000 | Freq: Every day | NASAL | 0 refills | Status: AC

## 2024-07-16 MED ORDER — PAXLOVID (300/100) 20 X 150 MG & 10 X 100MG PO TBPK: 3.0000 | ORAL_TABLET | Freq: Two times a day (BID) | ORAL | 0 refills | Status: AC

## 2024-07-16 MED ORDER — PROMETHAZINE-DM 6.25-15 MG/5ML PO SYRP: 5.0000 mL | ORAL_SOLUTION | Freq: Four times a day (QID) | ORAL | 0 refills | Status: AC | PRN

## 2024-07-16 NOTE — ED Triage Notes (Addendum)
 Pt reports headache, fatigue, body aches sinus drainage, nasal congestion x 2 days pt has used alka seltzer, ibuprofen , tylenol 

## 2024-07-16 NOTE — Discharge Instructions (Addendum)
 Your COVID test was positive. Take medication as prescribed.   Increase fluids and allow for plenty of rest. You may take over-the-counter Tylenol  or Ibuprofen  as needed for pain, fever, or general discomfort. Recommend normal saline nasal spray throughout the day for nasal congestion and runny nose. For your cough, recommend use of a humidifier in your bedroom at nighttime during sleep and sleeping elevated on pillows while cough symptoms persist. If you develop fever, you will need to remain home until you have been fever free for 24 hours with no medication. Go to the emergency department if you experience shortness of breath, difficulty breathing, or other concerns. You may follow-up with your primary care physician if symptoms fail to improve. Follow-up as needed.

## 2024-07-16 NOTE — ED Provider Notes (Signed)
 [facility]    CSN: [id number] Arrival date & time: 07/16/24  1157      History   Chief Complaint No chief complaint on file.   HPI Haley Garza is a 48 y.o. female.   The history is provided by the patient.   Patient presents with a 2-day history of headache, fatigue, body aches, sinus drainage, cough, and nasal congestion.  Patient denies fever, chills, ear pain, wheezing, difficulty breathing, chest pain, abdominal pain, nausea, vomiting, diarrhea, or rash.  So far, states she has been taking Alka-Seltzer, ibuprofen , and Tylenol  for her symptoms.  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  Past Medical History:  Diagnosis Date   Anemia    Anxiety    Arthritis    Asthma    as a child   Depression    History of kidney stones    HNP (herniated nucleus pulposus), lumbar    IFG (impaired fasting glucose)    Lumbar radiculopathy    Lumbar spondylosis    Obesity    PONV (postoperative nausea and vomiting)    Pre-diabetes    before weight loss surgery   Tobacco abuse    Weakness of left foot     Patient Active Problem List   Diagnosis Date Noted   HNP (herniated nucleus pulposus), lumbar 03/16/2024   Lumbar radiculopathy 03/16/2024   Weakness of left foot 03/16/2024   Osteoarthritis of lumbar spine with myelopathy 02/25/2024   Vitamin D  deficiency 05/02/2021   S/P laparoscopic hysterectomy 06/18/2017   Bradycardia, sinus 09/14/2015   Bariatric surgery status 09/14/2015   Asthma 09/14/2015   Anxiety and depression 09/14/2015   Tobacco abuse 09/14/2015   IFG (impaired fasting glucose) 09/14/2015    Past Surgical History:  Procedure Laterality Date   BARIATRIC SURGERY     BREAST BIOPSY Right 03/22/2021   us  bx of mass, venus marker, path pending   CESAREAN SECTION     x 2    COLONOSCOPY WITH PROPOFOL  N/A 02/07/2023   Procedure: COLONOSCOPY WITH PROPOFOL ;  Surgeon: Therisa Bi, MD;  Location: [facility]  ENDOSCOPY;  Service: Gastroenterology;  Laterality: N/A;   CYSTOSCOPY N/A 06/18/2017   Procedure: CYSTOSCOPY;  Surgeon: Lake Read, MD;  Location: [facility];  Service: Gynecology;  Laterality: N/A;   INTRAUTERINE DEVICE (IUD) INSERTION     feb 2014   KNEE ARTHROSCOPY Right    LAPAROSCOPIC HYSTERECTOMY N/A 06/18/2017   Procedure: HYSTERECTOMY TOTAL LAPAROSCOPIC;  Surgeon: Lake Read, MD;  Location: [facility];  Service: Gynecology;  Laterality: N/A;   LUMBAR LAMINECTOMY/DECOMPRESSION MICRODISCECTOMY Left 03/31/2024   Procedure: LUMBAR LAMINECTOMY/DECOMPRESSION MICRODISCECTOMY 1 LEVEL;  Surgeon: Claudene Penne ORN, MD;  Location: [facility];  Service: Neurosurgery;  Laterality: Left;  MIS LEFT L4-5 MICRODISCECTOMY   TOTAL ABDOMINAL HYSTERECTOMY     TUBAL LIGATION     UTERINE FIBROID EMBOLIZATION  01/09/2017   Triangular vascular    OB History     Gravida  3   Para  3   Term  3   Preterm      AB      Living  3      SAB      IAB      Ectopic      Multiple      Live Births  3            Home Medications    Prior to Admission medications   Medication Sig Start  Date End Date Taking? Authorizing Provider  b complex vitamins tablet Take 1 tablet by mouth daily.    [provider]  Biotin 5000 MCG CAPS Take 5,000 mcg by mouth daily.    [provider]  clotrimazole (MYCELEX) 10 MG troche Take 10 mg by mouth 4 (four) times daily.    [provider]  docusate sodium  (COLACE) 100 MG capsule Take 1 capsule (100 mg total) by mouth 2 (two) times daily. 03/31/24   Claudene Penne ORN, MD  ferrous sulfate 325 (65 FE) MG tablet Take 325 mg by mouth every other day.    [provider]  gabapentin  (NEURONTIN ) 100 MG capsule Start taking 1 pill at bedtime for 1 week, then 1 pill BID for 1 week, then 1 pill TID after that 07/01/24   Ulis Bottcher, PA-C  Multiple Vitamin (MULTIVITAMIN) tablet Take 1 tablet by mouth daily.    [provider]  ondansetron  (ZOFRAN ) 4 MG tablet Take 1 tablet (4 mg total) by mouth every 8 (eight) hours as needed for nausea or vomiting. 04/08/24   Ulis Bottcher, PA-C  OVER THE COUNTER MEDICATION Take 2 tablets by mouth daily. Vitamin a, d, e & k Gummy    [provider]  tiZANidine  (ZANAFLEX ) 4 MG tablet Take 1 tablet (4 mg total) by mouth every 8 (eight) hours as needed for muscle spasms. 07/01/24   Ulis Bottcher, PA-C  traMADol  (ULTRAM ) 50 MG tablet Take 1 tablet (50 mg total) by mouth every 6 (six) hours as needed. 07/01/24 07/01/25  Ulis Bottcher, PA-C    Family History Family History  Problem Relation Age of Onset   Hypertension Mother    Hyperlipidemia Mother    Depression Mother    Fibroids Mother    Breast cancer Neg Hx     Social History Social History   Tobacco Use   Smoking status: Former    Types: Cigarettes   Smokeless tobacco: Never   Tobacco comments:    a cigarrete every few days   Vaping Use   Vaping status: Never Used  Substance Use Topics   Alcohol use: Yes    Comment: occ   Drug use: No     Allergies   Honey bee treatment [bee venom] and Oxycodone -acetaminophen    Review of Systems Review of Systems Per HPI  Physical Exam Triage Vital Signs ED Triage Vitals  Encounter Vitals Group     BP 07/16/24 1244 119/74     Girls Systolic BP Percentile --      Girls Diastolic BP Percentile --      Boys Systolic BP Percentile --      Boys Diastolic BP Percentile --      Pulse Rate 07/16/24 1244 (!) 55     Resp 07/16/24 1244 20     Temp 07/16/24 1244 98.6 F (37 C)     Temp Source 07/16/24 1244 Oral     SpO2 07/16/24 1244 96 %     Weight --      Height --      Head Circumference --      Peak Flow --      Pain Score 07/16/24 1246 4     Pain Loc --      Pain Education --      Exclude from Growth Chart --    No data found.  Updated Vital Signs BP 119/74 (BP Location: Right Arm)   Pulse (!) 55   Temp 98.6 F (37  C) (Oral)   Resp 20   LMP  05/27/2017 Comment: preg test neg  SpO2 96%   Visual Acuity Right Eye Distance:   Left Eye Distance:   Bilateral Distance:    Right Eye Near:   Left Eye Near:    Bilateral Near:     Physical Exam Vitals and nursing note reviewed.  Constitutional:      General: She is not in acute distress.    Appearance: Normal appearance. She is well-developed.  HENT:     Head: Normocephalic and atraumatic.     Right Ear: Tympanic membrane, ear canal and external ear normal.     Left Ear: Tympanic membrane, ear canal and external ear normal.     Nose: Congestion present.     Right Turbinates: Enlarged and swollen.     Left Turbinates: Enlarged and swollen.     Right Sinus: No maxillary sinus tenderness or frontal sinus tenderness.     Left Sinus: No maxillary sinus tenderness or frontal sinus tenderness.     Mouth/Throat:     Lips: Pink.     Mouth: Mucous membranes are moist.     Pharynx: Uvula midline. Postnasal drip present. No pharyngeal swelling, oropharyngeal exudate, posterior oropharyngeal erythema or uvula swelling.     Comments: Cobblestoning present to posterior oropharynx  Eyes:     Extraocular Movements: Extraocular movements intact.     Conjunctiva/sclera: Conjunctivae normal.     Pupils: Pupils are equal, round, and reactive to light.  Neck:     Thyroid: No thyromegaly.     Trachea: No tracheal deviation.  Cardiovascular:     Rate and Rhythm: Normal rate and regular rhythm.     Pulses: Normal pulses.     Heart sounds: Normal heart sounds.  Pulmonary:     Effort: Pulmonary effort is normal. No respiratory distress.     Breath sounds: Normal breath sounds. No stridor. No wheezing, rhonchi or rales.  Abdominal:     General: Bowel sounds are normal. There is no distension.     Palpations: Abdomen is soft.     Tenderness: There is no abdominal tenderness.  Musculoskeletal:     Cervical back: Normal range of motion and neck supple.  Skin:    General: Skin is warm and dry.   Neurological:     General: No focal deficit present.     Mental Status: She is alert and oriented to person, place, and time.  Psychiatric:        Mood and Affect: Mood normal.        Behavior: Behavior normal.        Thought Content: Thought content normal.        Judgment: Judgment normal.      UC Treatments / Results  Labs (all labs ordered are listed, but only abnormal results are displayed) Labs Reviewed  POC SOFIA SARS ANTIGEN FIA    EKG   Radiology No results found.  Procedures Procedures (including critical care time)  Medications Ordered in UC Medications - No data to display  Initial Impression / Assessment and Plan / UC Course  I have reviewed the triage vital signs and the nursing notes.  Pertinent labs & imaging results that were available during my care of the patient were reviewed by me and considered in my medical decision making (see chart for details).  COVID test is positive.  Patient has elected to begin Paxlovid .  Patient does not take medication for cholesterol, denies prior history  of kidney disease.  Will provide symptomatic treatment with Promethazine  DM for her cough, and fluticasone  50 mcg nasal spray for nasal congestion and runny nose.  Supportive care recommendations were provided and discussed with the patient to include fluids, rest, over-the-counter analgesics, use of normal saline nasal spray, use of a humidifier during sleep.  Discussed indications patient regarding follow-up, along with ER follow-up precautions.  Patient was in agreement with this plan of care and verbalizes understanding.  All questions were answered.  Patient stable for discharge.  Work note was provided.  Final Clinical Impressions(s) / UC Diagnoses   Final diagnoses:  None   Discharge Instructions   None    ED Prescriptions   None    PDMP not reviewed this encounter.   Gilmer Etta PARAS, NP 07/16/24 1313

## 2024-07-30 ENCOUNTER — Encounter: Admitting: Family Medicine

## 2024-08-20 ENCOUNTER — Other Ambulatory Visit: Payer: Self-pay | Admitting: Medical Genetics

## 2024-08-20 DIAGNOSIS — Z006 Encounter for examination for normal comparison and control in clinical research program: Secondary | ICD-10-CM

## 2024-10-03 LAB — GENECONNECT MOLECULAR SCREEN: Genetic Analysis Overall Interpretation: NEGATIVE
